# Patient Record
Sex: Female | Born: 1950
Health system: Southern US, Community
[De-identification: ages and names within clinical notes are randomized; demographics above are authoritative.]

## PROBLEM LIST (undated history)

## (undated) DIAGNOSIS — K6389 Other specified diseases of intestine: Secondary | ICD-10-CM

## (undated) DIAGNOSIS — K119 Disease of salivary gland, unspecified: Secondary | ICD-10-CM

## (undated) DIAGNOSIS — F32A Depression, unspecified: Secondary | ICD-10-CM

## (undated) DIAGNOSIS — R51 Headache: Secondary | ICD-10-CM

## (undated) DIAGNOSIS — F419 Anxiety disorder, unspecified: Secondary | ICD-10-CM

## (undated) DIAGNOSIS — H269 Unspecified cataract: Secondary | ICD-10-CM

## (undated) DIAGNOSIS — K635 Polyp of colon: Secondary | ICD-10-CM

## (undated) DIAGNOSIS — R519 Headache, unspecified: Secondary | ICD-10-CM

## (undated) DIAGNOSIS — K56609 Unspecified intestinal obstruction, unspecified as to partial versus complete obstruction: Secondary | ICD-10-CM

## (undated) DIAGNOSIS — R112 Nausea with vomiting, unspecified: Secondary | ICD-10-CM

## (undated) DIAGNOSIS — F329 Major depressive disorder, single episode, unspecified: Secondary | ICD-10-CM

## (undated) DIAGNOSIS — K3184 Gastroparesis: Secondary | ICD-10-CM

## (undated) DIAGNOSIS — R197 Diarrhea, unspecified: Secondary | ICD-10-CM

## (undated) DIAGNOSIS — Z9889 Other specified postprocedural states: Secondary | ICD-10-CM

## (undated) DIAGNOSIS — M1990 Unspecified osteoarthritis, unspecified site: Secondary | ICD-10-CM

## (undated) HISTORY — DX: Gastroparesis: K31.84

## (undated) HISTORY — PX: ABDOMINAL HYSTERECTOMY: SHX81

## (undated) HISTORY — PX: CATARACT EXTRACTION, BILATERAL: SHX1313

## (undated) HISTORY — PX: BREAST EXCISIONAL BIOPSY: SUR124

## (undated) HISTORY — DX: Unspecified intestinal obstruction, unspecified as to partial versus complete obstruction: K56.609

## (undated) HISTORY — DX: Unspecified cataract: H26.9

## (undated) HISTORY — PX: ROTATOR CUFF REPAIR: SHX139

## (undated) HISTORY — DX: Diarrhea, unspecified: R19.7

## (undated) HISTORY — DX: Other specified diseases of intestine: K63.89

## (undated) HISTORY — PX: EYE SURGERY: SHX253

## (undated) HISTORY — PX: APPENDECTOMY: SHX54

## (undated) HISTORY — PX: OTHER SURGICAL HISTORY: SHX169

## (undated) HISTORY — DX: Polyp of colon: K63.5

## (undated) HISTORY — PX: BREAST SURGERY: SHX581

## (undated) HISTORY — DX: Disease of salivary gland, unspecified: K11.9

---

## 1999-04-22 ENCOUNTER — Other Ambulatory Visit: Admission: RE | Admit: 1999-04-22 | Discharge: 1999-04-22 | Payer: Self-pay | Admitting: Obstetrics and Gynecology

## 2000-02-14 ENCOUNTER — Encounter: Admission: RE | Admit: 2000-02-14 | Discharge: 2000-05-14 | Payer: Self-pay | Admitting: Internal Medicine

## 2000-06-01 ENCOUNTER — Other Ambulatory Visit: Admission: RE | Admit: 2000-06-01 | Discharge: 2000-06-01 | Payer: Self-pay | Admitting: Obstetrics and Gynecology

## 2001-01-30 ENCOUNTER — Encounter (INDEPENDENT_AMBULATORY_CARE_PROVIDER_SITE_OTHER): Payer: Self-pay

## 2001-01-30 ENCOUNTER — Ambulatory Visit (HOSPITAL_COMMUNITY): Admission: RE | Admit: 2001-01-30 | Discharge: 2001-01-30 | Payer: Self-pay | Admitting: Internal Medicine

## 2001-05-02 ENCOUNTER — Other Ambulatory Visit: Admission: RE | Admit: 2001-05-02 | Discharge: 2001-05-02 | Payer: Self-pay | Admitting: Obstetrics and Gynecology

## 2002-05-13 ENCOUNTER — Encounter: Admission: RE | Admit: 2002-05-13 | Discharge: 2002-05-13 | Payer: Self-pay | Admitting: Gynecology

## 2002-05-13 ENCOUNTER — Encounter: Payer: Self-pay | Admitting: Gynecology

## 2002-05-14 ENCOUNTER — Other Ambulatory Visit: Admission: RE | Admit: 2002-05-14 | Discharge: 2002-05-14 | Payer: Self-pay | Admitting: Gynecology

## 2003-02-15 ENCOUNTER — Emergency Department (HOSPITAL_COMMUNITY): Admission: EM | Admit: 2003-02-15 | Discharge: 2003-02-15 | Payer: Self-pay | Admitting: Emergency Medicine

## 2003-05-07 ENCOUNTER — Ambulatory Visit (HOSPITAL_COMMUNITY): Admission: RE | Admit: 2003-05-07 | Discharge: 2003-05-07 | Payer: Self-pay | Admitting: Orthopedic Surgery

## 2003-07-01 ENCOUNTER — Encounter: Admission: RE | Admit: 2003-07-01 | Discharge: 2003-07-01 | Payer: Self-pay | Admitting: Gynecology

## 2003-07-01 ENCOUNTER — Encounter: Payer: Self-pay | Admitting: Gynecology

## 2003-07-08 ENCOUNTER — Other Ambulatory Visit: Admission: RE | Admit: 2003-07-08 | Discharge: 2003-07-08 | Payer: Self-pay | Admitting: Gynecology

## 2003-08-21 ENCOUNTER — Encounter: Admission: RE | Admit: 2003-08-21 | Discharge: 2003-08-21 | Payer: Self-pay | Admitting: General Surgery

## 2003-08-22 ENCOUNTER — Encounter: Admission: RE | Admit: 2003-08-22 | Discharge: 2003-08-22 | Payer: Self-pay | Admitting: General Surgery

## 2003-09-12 ENCOUNTER — Ambulatory Visit (HOSPITAL_COMMUNITY): Admission: RE | Admit: 2003-09-12 | Discharge: 2003-09-12 | Payer: Self-pay | Admitting: General Surgery

## 2003-09-12 ENCOUNTER — Encounter (INDEPENDENT_AMBULATORY_CARE_PROVIDER_SITE_OTHER): Payer: Self-pay | Admitting: Specialist

## 2004-06-23 ENCOUNTER — Ambulatory Visit (HOSPITAL_COMMUNITY): Admission: RE | Admit: 2004-06-23 | Discharge: 2004-06-23 | Payer: Self-pay | Admitting: Internal Medicine

## 2004-09-03 ENCOUNTER — Encounter: Admission: RE | Admit: 2004-09-03 | Discharge: 2004-09-03 | Payer: Self-pay | Admitting: Gynecology

## 2004-09-07 ENCOUNTER — Other Ambulatory Visit: Admission: RE | Admit: 2004-09-07 | Discharge: 2004-09-07 | Payer: Self-pay | Admitting: Gynecology

## 2004-12-22 ENCOUNTER — Other Ambulatory Visit: Admission: RE | Admit: 2004-12-22 | Discharge: 2004-12-22 | Payer: Self-pay | Admitting: Gynecology

## 2005-04-15 ENCOUNTER — Ambulatory Visit: Payer: Self-pay | Admitting: Internal Medicine

## 2005-06-22 ENCOUNTER — Other Ambulatory Visit: Admission: RE | Admit: 2005-06-22 | Discharge: 2005-06-22 | Payer: Self-pay | Admitting: Gynecology

## 2005-09-21 ENCOUNTER — Encounter (INDEPENDENT_AMBULATORY_CARE_PROVIDER_SITE_OTHER): Payer: Self-pay | Admitting: *Deleted

## 2005-09-21 ENCOUNTER — Encounter: Admission: RE | Admit: 2005-09-21 | Discharge: 2005-09-21 | Payer: Self-pay | Admitting: General Surgery

## 2005-12-14 ENCOUNTER — Ambulatory Visit: Payer: Self-pay | Admitting: Internal Medicine

## 2006-01-24 ENCOUNTER — Other Ambulatory Visit: Admission: RE | Admit: 2006-01-24 | Discharge: 2006-01-24 | Payer: Self-pay | Admitting: Gynecology

## 2006-01-27 ENCOUNTER — Ambulatory Visit: Payer: Self-pay | Admitting: Internal Medicine

## 2006-05-03 ENCOUNTER — Encounter: Admission: RE | Admit: 2006-05-03 | Discharge: 2006-05-03 | Payer: Self-pay | Admitting: Gynecology

## 2006-07-04 ENCOUNTER — Other Ambulatory Visit: Admission: RE | Admit: 2006-07-04 | Discharge: 2006-07-04 | Payer: Self-pay | Admitting: Gynecology

## 2007-06-25 ENCOUNTER — Encounter: Admission: RE | Admit: 2007-06-25 | Discharge: 2007-06-25 | Payer: Self-pay | Admitting: Gynecology

## 2008-04-17 ENCOUNTER — Telehealth: Payer: Self-pay | Admitting: Internal Medicine

## 2008-05-29 DIAGNOSIS — Z860100 Personal history of colon polyps, unspecified: Secondary | ICD-10-CM | POA: Insufficient documentation

## 2008-05-29 DIAGNOSIS — K3184 Gastroparesis: Secondary | ICD-10-CM | POA: Insufficient documentation

## 2008-05-29 DIAGNOSIS — E119 Type 2 diabetes mellitus without complications: Secondary | ICD-10-CM | POA: Insufficient documentation

## 2008-06-02 ENCOUNTER — Ambulatory Visit: Payer: Self-pay | Admitting: Internal Medicine

## 2008-06-03 ENCOUNTER — Telehealth: Payer: Self-pay | Admitting: Internal Medicine

## 2008-07-09 ENCOUNTER — Encounter: Admission: RE | Admit: 2008-07-09 | Discharge: 2008-07-09 | Payer: Self-pay | Admitting: General Surgery

## 2008-12-15 ENCOUNTER — Encounter: Admission: RE | Admit: 2008-12-15 | Discharge: 2008-12-15 | Payer: Self-pay | Admitting: General Surgery

## 2009-07-03 ENCOUNTER — Telehealth: Payer: Self-pay | Admitting: Internal Medicine

## 2009-07-10 ENCOUNTER — Encounter: Admission: RE | Admit: 2009-07-10 | Discharge: 2009-07-10 | Payer: Self-pay | Admitting: General Surgery

## 2009-09-04 ENCOUNTER — Ambulatory Visit: Payer: Self-pay | Admitting: Internal Medicine

## 2010-01-06 ENCOUNTER — Emergency Department (HOSPITAL_BASED_OUTPATIENT_CLINIC_OR_DEPARTMENT_OTHER): Admission: EM | Admit: 2010-01-06 | Discharge: 2010-01-06 | Payer: Self-pay | Admitting: Emergency Medicine

## 2010-01-06 ENCOUNTER — Ambulatory Visit: Payer: Self-pay | Admitting: Diagnostic Radiology

## 2010-01-26 ENCOUNTER — Telehealth: Payer: Self-pay | Admitting: Internal Medicine

## 2010-01-26 ENCOUNTER — Ambulatory Visit: Payer: Self-pay | Admitting: Internal Medicine

## 2010-01-27 ENCOUNTER — Encounter: Payer: Self-pay | Admitting: Internal Medicine

## 2010-07-30 ENCOUNTER — Encounter: Admission: RE | Admit: 2010-07-30 | Discharge: 2010-07-30 | Payer: Self-pay | Admitting: Gynecology

## 2010-10-25 ENCOUNTER — Encounter: Payer: Self-pay | Admitting: General Surgery

## 2010-11-04 NOTE — Progress Notes (Signed)
Summary: Triage-Diarrhea  Phone Note Call from Patient Call back at [phone]   Caller: Patient Call For: Dr. Juanda Chance Reason for Call: Talk to Nurse Summary of Call: Pt thinks she has some HPylori in her stomach and wants to know if she needs Cipro. Initial call taken by: Karna Christmas,  January 26, 2010 11:36 AM  Follow-up for Phone Call        Pt. c/o 2 weeks of constant diarrhea, has 6-8 watery, urgent stools daily,lower abd. discomfort & bloating.  Denies blood, fever, n/v.   1) Immodium as needed for diarrhea. 2) Gas-x,Phazyme, etc. as needed for gas & bloating. 3) tylenol/Ibuprofen as needed 4) Soft,bland/BRAT diet. No spicy,greasy,fried foods. Advance diet as tolerated. 5) I will call pt., with new orders, after MD reviews.   Follow-up by: Laureen Ochs LPN,  January 26, 2010 11:57 AM  Additional Follow-up for Phone Call Additional follow up Details #1::        chart reviewed, hx of bact.overgrowth, diabetic autonomic neuropathy please send her Cipro 250 mg by mouth two times a day, #14, and Flagyl 250mg , #30 ,1 by mouth three times a day. Obtain stool C.diff, culture, lactoferrin. Additional Follow-up by: Hart Carwin MD,  January 26, 2010 1:09 PM    Additional Follow-up for Phone Call Additional follow up Details #2::    Meds to pt. pharmacy, lab order is in IDX. Above MD orders reviewed with patient. Pt. instructed to call back as needed.   Follow-up by: Laureen Ochs LPN,  January 26, 2010 1:41 PM  New/Updated Medications: CIPROFLOXACIN HCL 250 MG  TABS (CIPROFLOXACIN HCL) Take 1 twice a day times 7 days METRONIDAZOLE 250 MG TABS (METRONIDAZOLE) Take 1 by mouth 3 times daily for 10 days. Prescriptions: METRONIDAZOLE 250 MG TABS (METRONIDAZOLE) Take 1 by mouth 3 times daily for 10 days.  #30 x 0   Entered by:   Laureen Ochs LPN   Authorized by:   Hart Carwin MD   Signed by:   Laureen Ochs LPN on 14/48/1856   Method used:   [facility]ronically to        [facility] [phone]* (retail)       [address]       [place], [place]  [address]       Ph: [phone] or [phone]       Fax: [phone]   RxID:   [id number] CIPROFLOXACIN HCL 250 MG  TABS (CIPROFLOXACIN HCL) Take 1 twice a day times 7 days  #14 x 0   Entered by:   Laureen Ochs LPN   Authorized by:   Hart Carwin MD   Signed by:   Laureen Ochs LPN on 65/68/1275   Method used:   [facility]ronically to        [facility] [address] [phone]* (retail)       [address]       [place], [place]  [address]       Ph: [phone] or [phone]       Fax: [phone]   RxID:   [id number]

## 2010-12-22 LAB — GLUCOSE, CAPILLARY: Glucose-Capillary: 309 mg/dL — ABNORMAL HIGH (ref 70–99)

## 2011-02-18 NOTE — Op Note (Signed)
   NAME:  Taylor Adams, SHELLHAMMER                          ACCOUNT NO.:  [id number]   MEDICAL RECORD NO.:  [id number]                   PATIENT TYPE:  AMB   LOCATION:  DAY                                  FACILITY:  [facility]   PHYSICIAN:  Marlowe Kays, M.D.               DATE OF BIRTH:  [birth year]   DATE OF PROCEDURE:  05/07/2003  DATE OF DISCHARGE:                                 OPERATIVE REPORT   PREOPERATIVE DIAGNOSES:  1. Right carpal tunnel syndrome.  2. Chronic impingement syndrome with partial rotator cuff tear, right     shoulder.   POSTOPERATIVE DIAGNOSES:  1. Right carpal tunnel syndrome.  2. Chronic impingement syndrome with partial rotator cuff tear, right     shoulder.   OPERATION:  1. Right carpal tunnel release.  2. Right shoulder arthroscopy with a) removal of intraarticular adhesions     and shaving of labrum, b) arthroscopic subacromial decompression.   SURGEON:  Marlowe Kays, M.D.   ASSISTANTDruscilla Brownie. Idolina Primer, P.A. for procedure #2.   Dictation ended here.                                               Marlowe Kays, M.D.    JA/MEDQ  D:  05/07/2003  T:  05/07/2003  Job:  [id number]

## 2011-02-18 NOTE — Op Note (Signed)
NAME:  KAZIAH, KRIZEK                          ACCOUNT NO.:  [id number]   MEDICAL RECORD NO.:  [id number]                   PATIENT TYPE:  AMB   LOCATION:  DAY                                  FACILITY:  [facility]   PHYSICIAN:  Gita Kudo, M.D.              DATE OF BIRTH:  March 19, 1951   DATE OF PROCEDURE:  09/12/2003  DATE OF DISCHARGE:                                 OPERATIVE REPORT   OPERATION/PROCEDURE:  Excision nodule left neck and left breast mass.   SURGEON:  Gita Kudo, M.D.   ANESTHESIA:  General endotracheal anesthesia.   PREOPERATIVE DIAGNOSES:  1. Mass left breast.  2. Mass in neck.   POSTOPERATIVE DIAGNOSES:  1. Probably benign lymph node, neck.  2. Thickened breast tissue - lymph node versus small fibroadenoma, left     breast.   CLINICAL SUMMARY:  A 60 year old female with a lump in her left breast on  physical examination.  Her ultrasound and mammogram were negative and  because of that, we decided to do excisional biopsy.  In addition, she has a  small neck lymph node that feels benign.   OPERATIVE FINDINGS:  The breast tissue felt lumpy and I think it may well  have been just an area of dense tissue but could have been a lymph node or  small fibroadenoma.   DESCRIPTION OF PROCEDURE:  Under satisfactory general endotracheal  anesthesia, the patient was positioned and prepped and draped in the  standard fashion.  One percent Xylocaine was used in addition to the general  for postoperative analgesia.   An oblique incision made, centered over the mass in the neck and carried  down through the platysma.  Bleeders were coagulated.  The lymph node was  delivered into the wound and by blunt and sharp dissection was freed and  electrocautery used to complete the dissection.  The wound was made  hemostatic by cautery and closed in layers with 3-0 Vicryl and 4-0 nylon.   A similar approach was used in the breast.  The area was infiltrated with  Xylocaine.   A curved incision was made and carried down to the breast mass.  Dissection was used all around with cautery after freeing the mass by  dissection and it was sent for permanent sections.  The wound was made  hemostatic by cautery, lavaged with saline  and closed in layers with 3-0 Vicryl and 4-0 nylon.  Steri-Strips were then  applied to each skin incision and the patient went to the recovery room from  the operating room in good condition.  There were no complications and the  sponge and needle counts were correct.  Gita Kudo, M.D.    MRL/MEDQ  D:  09/12/2003  T:  09/12/2003  Job:  [id number]   cc:   Gaspar Garbe, M.D.  [address]  [place]  [place] [address]  Fax: [phone]

## 2011-02-18 NOTE — Op Note (Signed)
NAME:  Taylor Adams, Taylor Adams                          ACCOUNT NO.:  [id number]   MEDICAL RECORD NO.:  [id number]                   PATIENT TYPE:  AMB   LOCATION:  DAY                                  FACILITY:  [facility]   PHYSICIAN:  Marlowe Kays, M.D.               DATE OF BIRTH:  Sep 14, 1951   DATE OF PROCEDURE:  05/07/2003  DATE OF DISCHARGE:                                 OPERATIVE REPORT   PREOPERATIVE DIAGNOSES:  1. Right carpal tunnel syndrome.  2. Chronic impingement syndrome with partial rotator cuff tear, right     shoulder.   POSTOPERATIVE DIAGNOSES:  1. Right carpal tunnel syndrome.  2. Chronic impingement syndrome with partial rotator cuff tear, right     shoulder.   OPERATION:  1. Right carpal tunnel release.  2. Right shoulder arthroscopy with a) debridement of labrum and release of     intraarticular adhesions, b) arthroscopic subacromial decompression.   SURGEON:  Marlowe Kays, M.D.   ASSISTANTDruscilla Brownie. Idolina Primer, P.A. operation #2.   ANESTHESIA:  General.   PATHOLOGY AND JUSTIFICATION FOR PROCEDURE:  She had signs and symptoms of  carpal tunnel syndrome confirmed with nerve conduction studies and chronic  shoulder pain with an MRI demonstrating no definite abnormality in the joint  but rotator cuff tendinopathy associated with a down sloping acromion.  Accordingly she is here for the above mentioned surgery.   DESCRIPTION OF PROCEDURE:  Satisfactory general anesthesia, first did the  carpal tunnel syndrome using an arm board, pneumatic tourniquet, forearm was  prepped from mid forearm to fingertips and draped in a sterile field. Marked  out a carpal tunnel incision along the base of thenar eminence crossing  obliquely over the flexor crease of the wrist and the distal forearm. The  median nerve was identified, there was no palmaris longus tendon. The median  nerve artery completely terminated just proximal to the wrist and indicated  significant  impingement at this level.  She also had significant impingement  in the mid palm. The nerve was dissected out as well as all of its branches  in the distal palm, potential bleeders were coagulated with bipolar cautery.  The wound was then irrigated with sterile saline, skin and subcutaneous  tissue were closed with interrupted 4-0 nylon mattress sutures. The scrub  nurse and back table were kept sterile as we placed her in the beach chair  position on the Schlein frame and then prepped the right shoulder with  duraprep, draped her in a sterile field. I had rescrubbed and gowned for the  second portion of the procedure. Marked out the shoulder anatomy and  injected the lateral portal, posterior soft spot portal and the subacromial  space with 0.5% Marcaine with adrenaline. Through a posterior soft spot  portal, I entered the glenohumeral joint with a blunt trocar although it  took me several passes for some reason. On inspection  of the joint, she had  some significant adhesions which were tethering the motion of the joint as  well as what appeared to be a little labral disruption at the biceps anchor  area. The biceps tendon and rotator cuff did appear to be in good shape. I  advanced the scope into the anterior capsule, used a switching stick and  made an anterior incision and over this I placed a metal cannula and a 4/2  shaver and removed the restricting adhesions and also shaved down the biceps  anchor and labral area. All fluid was then removed from the glenohumeral  joint and I redirected the scope in the subacromial area and through a  lateral portal used a 4/2 shaver to remove subdeltoid bursa. I then used the  ArthroCare vaporizer to begin removing soft tissue from the underneath  surface of the acromion and the coracoacromial ligament. She had a  significant impingement problem, pictures were taken. I then used a 4-0 oval  bur to bur down the underneath surface of the acromion. I  went back and  forth between the bur and the vaporizer until I felt that she had complete  release of the impingement as documented with pictures with the arm to the  side and the arm abducted.  All fluid possible was then removed from the  subacromial space as well. The three portals were once again infiltrated  with 0.5% Marcaine with adrenaline and closed with 4-0 nylon. The  subacromial space was also injected with the Marcaine and adrenaline.  Betadine adaptic dry sterile dressing were applied and arm was placed in a  shoulder immobilizer after placing her in a volar plaster splint for her  carpal tunnel. She was taken to the recovery room in satisfactory condition  with no known complications.                                               Marlowe Kays, M.D.    JA/MEDQ  D:  05/07/2003  T:  05/07/2003  Job:  [id number]

## 2011-03-24 ENCOUNTER — Other Ambulatory Visit: Payer: Self-pay | Admitting: Gynecology

## 2011-06-10 ENCOUNTER — Ambulatory Visit (INDEPENDENT_AMBULATORY_CARE_PROVIDER_SITE_OTHER): Payer: 59 | Admitting: Internal Medicine

## 2011-06-10 ENCOUNTER — Encounter: Payer: Self-pay | Admitting: Internal Medicine

## 2011-06-10 DIAGNOSIS — R109 Unspecified abdominal pain: Secondary | ICD-10-CM

## 2011-06-10 DIAGNOSIS — R197 Diarrhea, unspecified: Secondary | ICD-10-CM

## 2011-06-10 MED ORDER — HYDROCODONE-ACETAMINOPHEN 5-500 MG PO TABS: 1.0000 | ORAL_TABLET | Freq: Four times a day (QID) | ORAL | Status: AC | PRN

## 2011-06-10 MED ORDER — CIPROFLOXACIN HCL 250 MG PO TABS: ORAL_TABLET | ORAL | Status: DC

## 2011-06-10 NOTE — Progress Notes (Signed)
Taylor Adams [facility] [birth year] MRN [id number]     History of Present Illness:  This is a 60 year old white female with insulin-dependent diabetes with episodic abdominal pain localized to the periumbilical area associated with severe cramps and diarrhea. She has had intermittent diarrhea for many years dating back to 2002 when she was diagnosed with bacterial overgrowth on a breath Hydrogen test.  There is no fever. During one episode in 2011 stool studies were negative. She has a positive family history of gallbladder disease and her daughter underwent a cholecystectomy at the age of 69. Patient takes probiotics everyday and was on Cipro for bacterial overgrowth in the past. She has documented gastroparesis. She was on Reglan but it was discontinued 2 years ago. Patient had a colonoscopy in 2002 with findings of a tubular adenoma. A pepeat colonoscopy in 2007 was normal. A small bowel follow-through in 2005 was normal. An upper abdominal ultrasound in 2005 was negative. Her celiac profile was normal in 2005 and again recently. Her small bowel follow-through in 2005 was normal. She had an allergic reaction to Flagyl in 2002.   Past Medical History  Diagnosis Date  . Gastroparesis   . Intestinal bacterial overgrowth   . Diabetes mellitus   . Benign colon polyp   . Diarrhea    Past Surgical History  Procedure Date  . Abdominal hysterectomy   . Breast surgery   . Appendectomy   . Resection of lymph node in neck     reports that she has never smoked. She has never used smokeless tobacco. She reports that she does not drink alcohol or use illicit drugs. family history includes Colon polyps in her father; Diabetes in her father and mother; and Heart disease in her brother and father.  There is no history of Colon cancer. No Known Allergies      Review of Systems: Denies nausea vomiting dysphagia odynophagia. Weight has been stable.  The remainder of the 10  point ROS is negative except as  outlined in H&P   Physical Exam: General appearance  Well developed, in no distress. Eyes- non icteric. HEENT nontraumatic, normocephalic. Mouth no lesions, tongue papillated, no cheilosis. Neck supple without adenopathy, thyroid not enlarged, no carotid bruits, no JVD. Lungs Clear to auscultation bilaterally. Cor normal S1 normal S2, regular rhythm , no murmur,  quiet precordium. Abdomen soft mildly diffusely tender. Hyperactive bowel sounds. No distention. No palpable mass. Insulin pump in the periumbilical area. Rectal: Soft Hemoccult negative stool. Extremities no pedal edema. Skin no lesions. Neurological alert and oriented x 3. Psychological normal mood and affect.  Assessment and Plan:  Problem #1 episodes of diarrhea dating back to 2002 when diagnosed with bacterial overgrowth. The most recent attacks are associated with severe abdominal pain. In the past, she responded to Cipro. At this time I want to rule out symptomatic gallbladder disease since her daughter just underwent a cholecystectomy. Her amylase during one of the attacks in 2005 was elevated but her upper abdominal ultrasound was negative. I would go, at this point, as far as  HIDA scan to make sure that she doesn't have acalculous cholecystitis. We will also proceed with a CT scan of the abdomen and pelvis to look for Crohn's disease and abnormal thickening of the small bowel suggestive of enteritis. I asked her to continue her probiotics and continue on Levsin sublingually 0.125 mg to the onset of the attack. She was given a limited amount of Vicodin  #10 tablets, to take at the onset  of abdominal pain.    06/10/2011 Lina Sar

## 2011-06-10 NOTE — Patient Instructions (Addendum)
You have been scheduled for an abdominal ultrasound at [facility] Radiology (1st floor of hospital) on 06/13/11 Monday at 8:30 am. Please arrive 15 minutes prior to your appointment for registration. Make certain not to have anything to eat or drink 6 hours prior to your appointment. Should you need to reschedule your appointment, please contact radiology at [phone].  ------------------------------------------------------------------------------------------------------------------------------------------------------------------------- You have been scheduled for a CT scan of the abdomen and pelvis at [facility] CT ([address]---this is in the same building as Architectural technologist).   You are scheduled on Tuesday 06/14/11 at 2:30 pm. You should arrive 15 minutes prior to your appointment time for registration. Please follow the written instructions below on the day of your exam:  WARNING: IF YOU ARE ALLERGIC TO IODINE/X-RAY DYE, PLEASE NOTIFY RADIOLOGY IMMEDIATELY AT [phone]! YOU WILL BE GIVEN A 13 HOUR PREMEDICATION PREP.  1) Do not eat or drink anything after 10:30 am (4 hours prior to your test) 2) You have been given 2 bottles of oral contrast to drink. The solution may taste better if refrigerated, but do NOT add ice or any other liquid to this solution. Shake well before drinking.    Drink 1 bottle of contrast @ 12:30 pm (2 hours prior to your exam)  Drink 1 bottle of contrast @ 1:30 pm (1 hour prior to your exam)  You may take any medications as prescribed with a small amount of water except for the following: Metformin, Glucophage, Glucovance, Avandamet, Riomet, Fortamet, Actoplus Met, Janumet, Glumetza or Metaglip. The above medications must be held the day of the exam AND 48 hours after the exam.  The purpose of you drinking the oral contrast is to aid in the visualization of your intestinal tract. The contrast solution may cause some diarrhea. Before your exam is  started, you will be given a small amount of fluid to drink. Depending on your individual set of symptoms, you may also receive an intravenous injection of x-ray contrast/dye. Plan on being at [facility] for 30 minutes or long, depending on the type of exam you are having performed.  If you have any questions regarding your exam or if you need to reschedule, you may call the CT department at [phone] between the hours of 8:00 am and 5:00 pm, Monday-Friday.  ________________________________________________________________________  We have sent the following medications to your pharmacy for you to pick up at your convenience: Vicodin Cipro  CC: Dr Wylene Simmer

## 2011-06-13 ENCOUNTER — Ambulatory Visit (HOSPITAL_COMMUNITY)
Admission: RE | Admit: 2011-06-13 | Discharge: 2011-06-13 | Disposition: A | Discharge status: Home / Self Care | Payer: 59 | Source: Ambulatory Visit | Attending: Internal Medicine | Admitting: Internal Medicine

## 2011-06-13 DIAGNOSIS — R197 Diarrhea, unspecified: Secondary | ICD-10-CM | POA: Insufficient documentation

## 2011-06-13 DIAGNOSIS — R109 Unspecified abdominal pain: Secondary | ICD-10-CM | POA: Insufficient documentation

## 2011-06-14 ENCOUNTER — Telehealth: Payer: Self-pay | Admitting: *Deleted

## 2011-06-14 ENCOUNTER — Other Ambulatory Visit (INDEPENDENT_AMBULATORY_CARE_PROVIDER_SITE_OTHER): Payer: 59 | Admitting: *Deleted

## 2011-06-14 ENCOUNTER — Ambulatory Visit (INDEPENDENT_AMBULATORY_CARE_PROVIDER_SITE_OTHER)
Admission: RE | Admit: 2011-06-14 | Discharge: 2011-06-14 | Disposition: A | Discharge status: Home / Self Care | Payer: 59 | Source: Ambulatory Visit | Attending: Internal Medicine | Admitting: Internal Medicine

## 2011-06-14 DIAGNOSIS — R109 Unspecified abdominal pain: Secondary | ICD-10-CM

## 2011-06-14 DIAGNOSIS — R197 Diarrhea, unspecified: Secondary | ICD-10-CM

## 2011-06-14 MED ORDER — IOHEXOL 300 MG/ML  SOLN
80.0000 mL | Freq: Once | INTRAMUSCULAR | Status: AC | PRN
  Administered 2011-06-14: 80 mL via INTRAVENOUS

## 2011-06-14 NOTE — Telephone Encounter (Signed)
Patient needs order for Stat BUN, Crea. Orders in computer

## 2011-06-15 ENCOUNTER — Telehealth: Payer: Self-pay | Admitting: *Deleted

## 2011-06-15 NOTE — Telephone Encounter (Signed)
Message copied by Daphine Deutscher on Wed Jun 15, 2011 10:11 AM ------      Message from: Hart Carwin      Created: Wed Jun 15, 2011  9:44 AM       Please call pt with normal ultrasound of the gall bladder

## 2011-06-15 NOTE — Telephone Encounter (Signed)
Patient notified of results.

## 2011-06-22 ENCOUNTER — Telehealth: Payer: Self-pay | Admitting: Internal Medicine

## 2011-06-22 NOTE — Telephone Encounter (Signed)
Please call pt with normal CT scan, gall bladder looks OK but CT scan  Does not show gall bladder function. Her daughter just had a lap chole. Please schedule HIDA with [facility]CK.

## 2011-06-22 NOTE — Telephone Encounter (Signed)
Scheduled HIDA with [facility] at [facility] on 07/14/11 at 7:45 AM. NPO after midnight. No pain medication 8 hours prior.Patient notified of date and instructions.

## 2011-06-22 NOTE — Telephone Encounter (Signed)
Spoke with patient and told her the CT was negative but would send a note to Dr. Juanda Chance and see if she has further information for patient. Please, advise.

## 2011-07-11 ENCOUNTER — Other Ambulatory Visit (HOSPITAL_COMMUNITY): Payer: 59

## 2011-07-14 ENCOUNTER — Ambulatory Visit (HOSPITAL_COMMUNITY)
Admission: RE | Admit: 2011-07-14 | Discharge: 2011-07-14 | Disposition: A | Discharge status: Home / Self Care | Payer: 59 | Source: Ambulatory Visit | Attending: Internal Medicine | Admitting: Internal Medicine

## 2011-07-14 DIAGNOSIS — R109 Unspecified abdominal pain: Secondary | ICD-10-CM | POA: Insufficient documentation

## 2011-07-14 MED ORDER — SINCALIDE 5 MCG IJ SOLR: 0.0200 ug/kg | Freq: Once | INTRAMUSCULAR | Status: DC

## 2011-07-14 MED ORDER — TECHNETIUM TC 99M MERTIATIDE: 15.0000 | Freq: Once | INTRAVENOUS | Status: AC | PRN

## 2011-07-14 MED ORDER — TECHNETIUM TC 99M MEBROFENIN IV KIT: 5.2000 | PACK | Freq: Once | INTRAVENOUS | Status: AC | PRN

## 2011-07-15 ENCOUNTER — Telehealth: Payer: Self-pay | Admitting: *Deleted

## 2011-07-15 NOTE — Telephone Encounter (Signed)
Left a message at cell number for patient to call me. 

## 2011-07-15 NOTE — Telephone Encounter (Signed)
Spoke with patient and gave her results as per Dr. Brodie. 

## 2011-07-15 NOTE — Telephone Encounter (Signed)
Message copied by Daphine Deutscher on Fri Jul 15, 2011 10:13 AM ------      Message from: Hart Carwin      Created: Thu Jul 14, 2011  5:06 PM       Please call pt with normal HIDA scan, she experienced  Symptoms with CCK injection but the gall bladder  Functions normally. No reason to consider cholecystectomy.

## 2011-08-12 ENCOUNTER — Other Ambulatory Visit: Payer: Self-pay | Admitting: Dermatology

## 2011-12-13 ENCOUNTER — Other Ambulatory Visit: Payer: Self-pay | Admitting: Internal Medicine

## 2011-12-13 MED ORDER — HYOSCYAMINE SULFATE 0.125 MG SL SUBL: 0.1250 mg | SUBLINGUAL_TABLET | Freq: Three times a day (TID) | SUBLINGUAL | Status: DC | PRN

## 2011-12-13 NOTE — Telephone Encounter (Signed)
rx sent

## 2012-03-26 ENCOUNTER — Other Ambulatory Visit: Payer: Self-pay | Admitting: Gynecology

## 2012-07-13 ENCOUNTER — Other Ambulatory Visit: Payer: Self-pay | Admitting: Gynecology

## 2012-07-13 DIAGNOSIS — Z1231 Encounter for screening mammogram for malignant neoplasm of breast: Secondary | ICD-10-CM

## 2012-08-09 ENCOUNTER — Ambulatory Visit
Admission: RE | Admit: 2012-08-09 | Discharge: 2012-08-09 | Disposition: A | Discharge status: Home / Self Care | Payer: 59 | Source: Ambulatory Visit | Attending: Gynecology | Admitting: Gynecology

## 2012-08-09 DIAGNOSIS — Z1231 Encounter for screening mammogram for malignant neoplasm of breast: Secondary | ICD-10-CM

## 2013-02-20 ENCOUNTER — Other Ambulatory Visit (INDEPENDENT_AMBULATORY_CARE_PROVIDER_SITE_OTHER): Payer: Self-pay | Admitting: Otolaryngology

## 2013-02-20 DIAGNOSIS — K115 Sialolithiasis: Secondary | ICD-10-CM

## 2013-02-26 ENCOUNTER — Other Ambulatory Visit: Payer: 59

## 2013-02-27 ENCOUNTER — Ambulatory Visit
Admission: RE | Admit: 2013-02-27 | Discharge: 2013-02-27 | Disposition: A | Discharge status: Home / Self Care | Payer: 59 | Source: Ambulatory Visit | Attending: Otolaryngology | Admitting: Otolaryngology

## 2013-02-27 DIAGNOSIS — K115 Sialolithiasis: Secondary | ICD-10-CM

## 2013-02-27 MED ORDER — IOHEXOL 300 MG/ML  SOLN
75.0000 mL | Freq: Once | INTRAMUSCULAR | Status: AC | PRN
  Administered 2013-02-27: 75 mL via INTRAVENOUS

## 2013-10-03 ENCOUNTER — Ambulatory Visit (HOSPITAL_COMMUNITY): Admission: RE | Admit: 2013-10-03 | Payer: 59 | Source: Ambulatory Visit

## 2013-10-03 ENCOUNTER — Encounter (HOSPITAL_COMMUNITY): Payer: Self-pay | Admitting: Emergency Medicine

## 2013-10-03 ENCOUNTER — Emergency Department (HOSPITAL_COMMUNITY)
Admission: EM | Admit: 2013-10-03 | Discharge: 2013-10-03 | Disposition: A | Discharge status: Home / Self Care | Payer: 59 | Attending: Emergency Medicine | Admitting: Emergency Medicine

## 2013-10-03 ENCOUNTER — Telehealth: Payer: Self-pay | Admitting: Internal Medicine

## 2013-10-03 DIAGNOSIS — R109 Unspecified abdominal pain: Secondary | ICD-10-CM

## 2013-10-03 DIAGNOSIS — Z9089 Acquired absence of other organs: Secondary | ICD-10-CM | POA: Insufficient documentation

## 2013-10-03 DIAGNOSIS — Z90710 Acquired absence of both cervix and uterus: Secondary | ICD-10-CM | POA: Insufficient documentation

## 2013-10-03 DIAGNOSIS — Z794 Long term (current) use of insulin: Secondary | ICD-10-CM | POA: Insufficient documentation

## 2013-10-03 DIAGNOSIS — R1084 Generalized abdominal pain: Secondary | ICD-10-CM | POA: Insufficient documentation

## 2013-10-03 DIAGNOSIS — E119 Type 2 diabetes mellitus without complications: Secondary | ICD-10-CM | POA: Insufficient documentation

## 2013-10-03 DIAGNOSIS — R112 Nausea with vomiting, unspecified: Secondary | ICD-10-CM | POA: Insufficient documentation

## 2013-10-03 DIAGNOSIS — Z860100 Personal history of colon polyps, unspecified: Secondary | ICD-10-CM | POA: Insufficient documentation

## 2013-10-03 DIAGNOSIS — Z79899 Other long term (current) drug therapy: Secondary | ICD-10-CM | POA: Insufficient documentation

## 2013-10-03 DIAGNOSIS — Z8719 Personal history of other diseases of the digestive system: Secondary | ICD-10-CM | POA: Insufficient documentation

## 2013-10-03 LAB — POCT I-STAT 3, VENOUS BLOOD GAS (G3P V)
Acid-Base Excess: 1 mmol/L (ref 0.0–2.0)
Bicarbonate: 28.3 mEq/L — ABNORMAL HIGH (ref 20.0–24.0)
O2 SAT: 43 %
PO2 VEN: 26 mmHg — AB (ref 30.0–45.0)
Patient temperature: 97
TCO2: 30 mmol/L (ref 0–100)
pCO2, Ven: 56.1 mmHg — ABNORMAL HIGH (ref 45.0–50.0)
pH, Ven: 7.307 — ABNORMAL HIGH (ref 7.250–7.300)

## 2013-10-03 LAB — COMPREHENSIVE METABOLIC PANEL
ALK PHOS: 87 U/L (ref 39–117)
ALT: 20 U/L (ref 0–35)
AST: 34 U/L (ref 0–37)
Albumin: 4.6 g/dL (ref 3.5–5.2)
BUN: 17 mg/dL (ref 6–23)
CALCIUM: 10.1 mg/dL (ref 8.4–10.5)
CO2: 25 meq/L (ref 19–32)
Chloride: 92 mEq/L — ABNORMAL LOW (ref 96–112)
Creatinine, Ser: 0.65 mg/dL (ref 0.50–1.10)
GLUCOSE: 309 mg/dL — AB (ref 70–99)
POTASSIUM: 4.1 meq/L (ref 3.7–5.3)
SODIUM: 137 meq/L (ref 137–147)
TOTAL PROTEIN: 8 g/dL (ref 6.0–8.3)
Total Bilirubin: 0.4 mg/dL (ref 0.3–1.2)

## 2013-10-03 LAB — URINALYSIS, ROUTINE W REFLEX MICROSCOPIC
BILIRUBIN URINE: NEGATIVE
GLUCOSE, UA: 500 mg/dL — AB
HGB URINE DIPSTICK: NEGATIVE
Ketones, ur: >80 mg/dL — AB
Leukocytes, UA: NEGATIVE
Nitrite: NEGATIVE
Protein, ur: NEGATIVE mg/dL
SPECIFIC GRAVITY, URINE: 1.018 (ref 1.005–1.030)
UROBILINOGEN UA: 0.2 mg/dL (ref 0.0–1.0)
pH: 7 (ref 5.0–8.0)

## 2013-10-03 LAB — LIPASE, BLOOD: Lipase: 27 U/L (ref 11–59)

## 2013-10-03 LAB — CBC WITH DIFFERENTIAL/PLATELET
BASOS ABS: 0 10*3/uL (ref 0.0–0.1)
Basophils Relative: 1 % (ref 0–1)
Eosinophils Absolute: 0.2 10*3/uL (ref 0.0–0.7)
Eosinophils Relative: 3 % (ref 0–5)
HCT: 36 % (ref 36.0–46.0)
Hemoglobin: 12.5 g/dL (ref 12.0–15.0)
LYMPHS ABS: 1.8 10*3/uL (ref 0.7–4.0)
LYMPHS PCT: 25 % (ref 12–46)
MCH: 29.1 pg (ref 26.0–34.0)
MCHC: 34.7 g/dL (ref 30.0–36.0)
MCV: 83.9 fL (ref 78.0–100.0)
Monocytes Absolute: 0.4 10*3/uL (ref 0.1–1.0)
Monocytes Relative: 6 % (ref 3–12)
NEUTROS PCT: 66 % (ref 43–77)
Neutro Abs: 4.8 10*3/uL (ref 1.7–7.7)
PLATELETS: 220 10*3/uL (ref 150–400)
RBC: 4.29 MIL/uL (ref 3.87–5.11)
RDW: 13.8 % (ref 11.5–15.5)
WBC: 7.3 10*3/uL (ref 4.0–10.5)

## 2013-10-03 LAB — GLUCOSE, CAPILLARY
GLUCOSE-CAPILLARY: 283 mg/dL — AB (ref 70–99)
GLUCOSE-CAPILLARY: 272 mg/dL — AB (ref 70–99)

## 2013-10-03 MED ORDER — IOHEXOL 300 MG/ML  SOLN
25.0000 mL | INTRAMUSCULAR | Status: DC
  Administered 2013-10-03: 25 mL via ORAL

## 2013-10-03 MED ORDER — SODIUM CHLORIDE 0.9 % IV BOLUS (SEPSIS)
1000.0000 mL | Freq: Once | INTRAVENOUS | Status: AC
  Administered 2013-10-03: 1000 mL via INTRAVENOUS

## 2013-10-03 MED ORDER — TRAMADOL HCL 50 MG PO TABS: 50.0000 mg | ORAL_TABLET | Freq: Four times a day (QID) | ORAL | Status: DC | PRN

## 2013-10-03 MED ORDER — METOCLOPRAMIDE HCL 5 MG/ML IJ SOLN
10.0000 mg | Freq: Once | INTRAMUSCULAR | Status: AC
  Administered 2013-10-03: 10 mg via INTRAVENOUS
  Filled 2013-10-03: qty 2

## 2013-10-03 MED ORDER — ONDANSETRON HCL 4 MG/2ML IJ SOLN
4.0000 mg | Freq: Once | INTRAMUSCULAR | Status: AC
  Administered 2013-10-03: 4 mg via INTRAVENOUS
  Filled 2013-10-03: qty 2

## 2013-10-03 MED ORDER — ONDANSETRON HCL 4 MG/2ML IJ SOLN
INTRAMUSCULAR | Status: AC
  Filled 2013-10-03: qty 2

## 2013-10-03 MED ORDER — HYDROMORPHONE HCL PF 1 MG/ML IJ SOLN
1.0000 mg | Freq: Once | INTRAMUSCULAR | Status: AC
  Administered 2013-10-03: 1 mg via INTRAVENOUS

## 2013-10-03 MED ORDER — HYDROMORPHONE HCL PF 1 MG/ML IJ SOLN
INTRAMUSCULAR | Status: AC
  Filled 2013-10-03: qty 1

## 2013-10-03 MED ORDER — HYDROMORPHONE HCL PF 1 MG/ML IJ SOLN
1.0000 mg | Freq: Once | INTRAMUSCULAR | Status: AC
  Administered 2013-10-03: 1 mg via INTRAVENOUS
  Filled 2013-10-03: qty 1

## 2013-10-03 MED ORDER — ONDANSETRON HCL 4 MG/2ML IJ SOLN
4.0000 mg | Freq: Once | INTRAMUSCULAR | Status: AC
  Administered 2013-10-03: 4 mg via INTRAVENOUS

## 2013-10-03 NOTE — ED Notes (Signed)
C/o generalized abd pain, nausea, and vomiting since 7pm.  Denies diarrhea.

## 2013-10-03 NOTE — ED Notes (Signed)
Dr Hyacinth MeekerMiller given a copy of pt blood gas venous results

## 2013-10-03 NOTE — Discharge Instructions (Signed)
We have encouraged you to stay for your CAT scan, you have declined and have decided to leave at this time. You may return at any time for repeat evaluation and recheck. I encourage you to follow up with your doctor today for recheck and further investigation into the cause of your abdominal pain.  Please call your doctor for a followup appointment within 24-48 hours. When you talk to your doctor please let them know that you were seen in the emergency department and have them acquire all of your records so that they can discuss the findings with you and formulate a treatment plan to fully care for your new and ongoing problems.

## 2013-10-03 NOTE — ED Provider Notes (Signed)
CSN: [id number]     Arrival date & time 10/03/13  0144 History   First MD Initiated Contact with Patient 10/03/13 [phone]     Chief Complaint  Patient presents with  . Abdominal Pain  . Emesis   HPI  History provided by the patient. Patient is a 63 year old female with history of diabetes, gastroparesis, appendectomy and total hysterectomy who presents with complaints of acute onset abdominal pain with nausea and vomiting. Patient states she began to feel bad around 7 PM shortly after eating dinner. She states that she was having some broccoli and green peppers which she feels will cause of her symptoms. She reports having multiple episodes of nausea and vomiting x 6. This has been followed by significant abdominal pain and discomfort. Patient did not use any medications or treatment for symptoms. She also states that her blood sugar became high surely after onset of symptoms. She was otherwise well during the day with typical blood sugars in the 100s. She has not had any recent illnesses. No fever, chills or sweats. Denies any urinary symptoms. No dysuria, hematuria urinary frequency. Denies any back or flank pain. No other aggravating or alleviating factors. No other associated symptoms. Patient does state she has had some similar pains in the past and is followed for some of her abdominal pains by Dr. Dickie La with GI. She does have an upcoming appointment on January 14.   Past Medical History  Diagnosis Date  . Gastroparesis   . Intestinal bacterial overgrowth   . Diabetes mellitus   . Benign colon polyp   . Diarrhea    Past Surgical History  Procedure Laterality Date  . Abdominal hysterectomy    . Breast surgery    . Appendectomy    . Resection of lymph node in neck     Family History  Problem Relation Age of Onset  . Colon cancer Neg Hx   . Colon polyps Father   . Heart disease Father   . Heart disease Brother   . Diabetes Mother   . Diabetes Father    History  Substance Use  Topics  . Smoking status: Never Smoker   . Smokeless tobacco: Never Used  . Alcohol Use: Yes   OB History   Grav Para Term Preterm Abortions TAB SAB Ect Mult Living                 Review of Systems  Constitutional: Negative for fever, chills and diaphoresis.  Respiratory: Negative for shortness of breath.   Gastrointestinal: Positive for nausea, vomiting and abdominal pain. Negative for diarrhea and constipation.  Genitourinary: Negative for dysuria, frequency, hematuria and flank pain.  All other systems reviewed and are negative.    Allergies  Review of patient's allergies indicates no known allergies.  Home Medications   Current Outpatient Rx  Name  Route  Sig  Dispense  Refill  . CALCIUM PO   Oral   Take 1 tablet by mouth daily.         . CHROMIUM PO   Oral   Take 1 tablet by mouth daily.         Marland Kitchen DIOVAN 80 MG tablet   Oral   Take 80 mg by mouth daily.          . Insulin Human (INSULIN PUMP) 100 unit/ml SOLN   Subcutaneous   Inject into the skin continuous.         Marland Kitchen MAGNESIUM PO   Oral   Take  1 tablet by mouth daily.         . Probiotic Product (PROBIOTIC PO)   Oral   Take 1 tablet by mouth daily.          BP 144/68  Pulse 89  Temp(Src) 97 F (36.1 C) (Oral)  Resp 20  Wt 115 lb 4 oz (52.277 kg)  SpO2 99% Physical Exam  Nursing note and vitals reviewed. Constitutional: She is oriented to person, place, and time. She appears well-developed and well-nourished. No distress.  HENT:  Head: Normocephalic.  Cardiovascular: Normal rate and regular rhythm.   Pulmonary/Chest: Effort normal and breath sounds normal. No respiratory distress. She has no wheezes. She has no rales.  Abdominal: Soft. She exhibits no distension. There is tenderness. There is guarding. There is no rebound.  There is diffuse tenderness throughout the entire abdomen with guarding.  Neurological: She is alert and oriented to person, place, and time.  Skin: Skin is warm  and dry. No rash noted.  Psychiatric: She has a normal mood and affect. Her behavior is normal.    ED Course  Procedures   DIAGNOSTIC STUDIES: Oxygen Saturation is 100% on room air.  COORDINATION OF CARE:  Nursing notes reviewed. Vital signs reviewed. Initial pt interview and examination performed.   3:45 AM-patient seen and evaluated. She appears uncomfortable with intermittent cramping sharp pains. Discussed work up plan with pt at bedside, which includes lab testing. Pt agrees with plan.  Patient feeling much better after initial treatments. She continues to have diffuse pains on abdominal exam. Patient does have a slight elevated anion gap of 20. I discussed recommendations for her further testing with [facility] as well as recommendations for a CAT scan. Patient initially was slightly hesitant stating she felt better and wished to return home however she did agree with the additional tests.  Patient was discussed with attending physician who agrees with plan.   5:30 AM nurse notified me that patient no longer wishes to wait and have the CAT scan. She wishes to return home. She has been given strict return precautions. Did advise her to the risks of not having a CAT scan. Besides elevated anion gap no other significant findings on lab testing. Patient expressed her understanding and agreed to return if she changes her mind or worsens.   Treatment plan initiated: Medications  metoCLOPramide (REGLAN) injection 10 mg (10 mg Intravenous Given 10/03/13 0244)  sodium chloride 0.9 % bolus 1,000 mL (1,000 mLs Intravenous New Bag/Given 10/03/13 0243)  HYDROmorphone (DILAUDID) injection 1 mg (1 mg Intravenous Given 10/03/13 0244)  ondansetron (ZOFRAN) injection 4 mg (4 mg Intravenous Given 10/03/13 0244)    Results for orders placed during the hospital encounter of 10/03/13  CBC WITH DIFFERENTIAL      Result Value Range   WBC 7.3  4.0 - 10.5 K/uL   RBC 4.29  3.87 - 5.11 MIL/uL   Hemoglobin 12.5  12.0  - 15.0 g/dL   HCT 95.6  21.3 - 08.6 %   MCV 83.9  78.0 - 100.0 fL   MCH 29.1  26.0 - 34.0 pg   MCHC 34.7  30.0 - 36.0 g/dL   RDW 57.8  46.9 - 62.9 %   Platelets 220  150 - 400 K/uL   Neutrophils Relative % 66  43 - 77 %   Neutro Abs 4.8  1.7 - 7.7 K/uL   Lymphocytes Relative 25  12 - 46 %   Lymphs Abs 1.8  0.7 - 4.0  K/uL   Monocytes Relative 6  3 - 12 %   Monocytes Absolute 0.4  0.1 - 1.0 K/uL   Eosinophils Relative 3  0 - 5 %   Eosinophils Absolute 0.2  0.0 - 0.7 K/uL   Basophils Relative 1  0 - 1 %   Basophils Absolute 0.0  0.0 - 0.1 K/uL  COMPREHENSIVE METABOLIC PANEL      Result Value Range   Sodium 137  137 - 147 mEq/L   Potassium 4.1  3.7 - 5.3 mEq/L   Chloride 92 (*) 96 - 112 mEq/L   CO2 25  19 - 32 mEq/L   Glucose, Bld 309 (*) 70 - 99 mg/dL   BUN 17  6 - 23 mg/dL   Creatinine, Ser 7.250.65  0.50 - 1.10 mg/dL   Calcium 36.610.1  8.4 - 44.010.5 mg/dL   Total Protein 8.0  6.0 - 8.3 g/dL   Albumin 4.6  3.5 - 5.2 g/dL   AST 34  0 - 37 U/L   ALT 20  0 - 35 U/L   Alkaline Phosphatase 87  39 - 117 U/L   Total Bilirubin 0.4  0.3 - 1.2 mg/dL   GFR calc non Af Amer >90  >90 mL/min   GFR calc Af Amer >90  >90 mL/min  LIPASE, BLOOD      Result Value Range   Lipase 27  11 - 59 U/L  URINALYSIS, ROUTINE W REFLEX MICROSCOPIC      Result Value Range   Color, Urine YELLOW  YELLOW   APPearance CLOUDY (*) CLEAR   Specific Gravity, Urine 1.018  1.005 - 1.030   pH 7.0  5.0 - 8.0   Glucose, UA 500 (*) NEGATIVE mg/dL   Hgb urine dipstick NEGATIVE  NEGATIVE   Bilirubin Urine NEGATIVE  NEGATIVE   Ketones, ur >80 (*) NEGATIVE mg/dL   Protein, ur NEGATIVE  NEGATIVE mg/dL   Urobilinogen, UA 0.2  0.0 - 1.0 mg/dL   Nitrite NEGATIVE  NEGATIVE   Leukocytes, UA NEGATIVE  NEGATIVE  GLUCOSE, CAPILLARY      Result Value Range   Glucose-Capillary 283 (*) 70 - 99 mg/dL  GLUCOSE, CAPILLARY      Result Value Range   Glucose-Capillary 272 (*) 70 - 99 mg/dL  POCT I-STAT 3, BLOOD GAS (G3P V)      Result  Value Range   pH, Ven 7.307 (*) 7.250 - 7.300   pCO2, Ven 56.1 (*) 45.0 - 50.0 mmHg   pO2, Ven 26.0 (*) 30.0 - 45.0 mmHg   Bicarbonate 28.3 (*) 20.0 - 24.0 mEq/L   TCO2 30  0 - 100 mmol/L   O2 Saturation 43.0     Acid-Base Excess 1.0  0.0 - 2.0 mmol/L   Patient temperature 97.0 F     Sample type VENOUS     Comment NOTIFIED PHYSICIAN     Anion gap = 20.   Imaging Review No results found.    MDM   1. Abdominal pain        Angus Sellereter S Zyana Amaro, PA-C 10/03/13 2254

## 2013-10-03 NOTE — Telephone Encounter (Signed)
Having severe abdominal pain, nausea and vomiting with SOB a few hrs after eating peppers and steak 12/31. Advised she go to ED.

## 2013-10-04 ENCOUNTER — Telehealth: Payer: Self-pay | Admitting: Internal Medicine

## 2013-10-04 NOTE — ED Provider Notes (Signed)
Medical screening examination/treatment/procedure(s) were performed by non-physician practitioner and as supervising physician I was immediately available for consultation/collaboration.    Frederico Gerling D Alem Fahl, MD 10/04/13 0000 

## 2013-10-04 NOTE — Telephone Encounter (Signed)
Patient reports she went to the ER with sever abdominal pain and bloating .  Reviewing the records she declined CT.  Patient reports she is not feeling better, has constipation, and bloating.  She is advised that I don't have any openings today and have scheduled her for Monday with Amy Esterwood PA.  She is advised that if her symptoms worsen or she develops new symptoms she will need to return to the ER.  She reports she has been taking tramadol for pain. She asks if she can take Miralax for constipation.  She is advised that ok to take Miralax as needed.  She will return to ER over the weekend if no improvement.

## 2013-10-06 ENCOUNTER — Emergency Department (HOSPITAL_COMMUNITY): Payer: 59

## 2013-10-06 ENCOUNTER — Inpatient Hospital Stay (HOSPITAL_COMMUNITY)
Admission: EM | Admit: 2013-10-06 | Discharge: 2013-10-13 | DRG: 330 | Disposition: A | Discharge status: Home / Self Care | Payer: 59 | Attending: General Surgery | Admitting: General Surgery

## 2013-10-06 ENCOUNTER — Encounter (HOSPITAL_COMMUNITY): Payer: Self-pay | Admitting: Emergency Medicine

## 2013-10-06 DIAGNOSIS — Z860100 Personal history of colon polyps, unspecified: Secondary | ICD-10-CM

## 2013-10-06 DIAGNOSIS — Z833 Family history of diabetes mellitus: Secondary | ICD-10-CM

## 2013-10-06 DIAGNOSIS — F411 Generalized anxiety disorder: Secondary | ICD-10-CM | POA: Diagnosis present

## 2013-10-06 DIAGNOSIS — E876 Hypokalemia: Secondary | ICD-10-CM | POA: Diagnosis not present

## 2013-10-06 DIAGNOSIS — N390 Urinary tract infection, site not specified: Secondary | ICD-10-CM | POA: Diagnosis present

## 2013-10-06 DIAGNOSIS — Z8371 Family history of colonic polyps: Secondary | ICD-10-CM

## 2013-10-06 DIAGNOSIS — I10 Essential (primary) hypertension: Secondary | ICD-10-CM | POA: Diagnosis present

## 2013-10-06 DIAGNOSIS — E1049 Type 1 diabetes mellitus with other diabetic neurological complication: Secondary | ICD-10-CM | POA: Diagnosis present

## 2013-10-06 DIAGNOSIS — E119 Type 2 diabetes mellitus without complications: Secondary | ICD-10-CM

## 2013-10-06 DIAGNOSIS — Z8249 Family history of ischemic heart disease and other diseases of the circulatory system: Secondary | ICD-10-CM

## 2013-10-06 DIAGNOSIS — Z83719 Family history of colon polyps, unspecified: Secondary | ICD-10-CM

## 2013-10-06 DIAGNOSIS — K56609 Unspecified intestinal obstruction, unspecified as to partial versus complete obstruction: Principal | ICD-10-CM | POA: Diagnosis present

## 2013-10-06 DIAGNOSIS — K565 Intestinal adhesions [bands], unspecified as to partial versus complete obstruction: Secondary | ICD-10-CM

## 2013-10-06 DIAGNOSIS — K3184 Gastroparesis: Secondary | ICD-10-CM | POA: Diagnosis present

## 2013-10-06 LAB — CBC WITH DIFFERENTIAL/PLATELET
Basophils Absolute: 0 10*3/uL (ref 0.0–0.1)
Basophils Relative: 0 % (ref 0–1)
EOS PCT: 0 % (ref 0–5)
Eosinophils Absolute: 0 10*3/uL (ref 0.0–0.7)
HEMATOCRIT: 42.5 % (ref 36.0–46.0)
HEMOGLOBIN: 14.9 g/dL (ref 12.0–15.0)
LYMPHS ABS: 0.7 10*3/uL (ref 0.7–4.0)
Lymphocytes Relative: 11 % — ABNORMAL LOW (ref 12–46)
MCH: 29.8 pg (ref 26.0–34.0)
MCHC: 35.1 g/dL (ref 30.0–36.0)
MCV: 85 fL (ref 78.0–100.0)
MONO ABS: 0.9 10*3/uL (ref 0.1–1.0)
Monocytes Relative: 13 % — ABNORMAL HIGH (ref 3–12)
Neutro Abs: 5.2 10*3/uL (ref 1.7–7.7)
Neutrophils Relative %: 76 % (ref 43–77)
Platelets: 290 10*3/uL (ref 150–400)
RBC: 5 MIL/uL (ref 3.87–5.11)
RDW: 14.1 % (ref 11.5–15.5)
WBC: 6.8 10*3/uL (ref 4.0–10.5)

## 2013-10-06 LAB — COMPREHENSIVE METABOLIC PANEL
ALBUMIN: 3.3 g/dL — AB (ref 3.5–5.2)
ALT: 17 U/L (ref 0–35)
AST: 27 U/L (ref 0–37)
Alkaline Phosphatase: 125 U/L — ABNORMAL HIGH (ref 39–117)
BUN: 23 mg/dL (ref 6–23)
CALCIUM: 10.1 mg/dL (ref 8.4–10.5)
CO2: 28 mEq/L (ref 19–32)
Chloride: 91 mEq/L — ABNORMAL LOW (ref 96–112)
Creatinine, Ser: 0.81 mg/dL (ref 0.50–1.10)
GFR calc Af Amer: 89 mL/min — ABNORMAL LOW (ref >90)
GFR calc non Af Amer: 77 mL/min — ABNORMAL LOW (ref >90)
Glucose, Bld: 255 mg/dL — ABNORMAL HIGH (ref 70–99)
Potassium: 5.3 mEq/L (ref 3.7–5.3)
SODIUM: 133 meq/L — AB (ref 137–147)
Total Bilirubin: 0.6 mg/dL (ref 0.3–1.2)
Total Protein: 8.2 g/dL (ref 6.0–8.3)

## 2013-10-06 LAB — GLUCOSE, CAPILLARY
GLUCOSE-CAPILLARY: 198 mg/dL — AB (ref 70–99)
GLUCOSE-CAPILLARY: 184 mg/dL — AB (ref 70–99)
Glucose-Capillary: 232 mg/dL — ABNORMAL HIGH (ref 70–99)

## 2013-10-06 LAB — POCT I-STAT 3, VENOUS BLOOD GAS (G3P V)
Acid-Base Excess: 1 mmol/L (ref 0.0–2.0)
Bicarbonate: 29.2 mEq/L — ABNORMAL HIGH (ref 20.0–24.0)
O2 Saturation: 15 %
PCO2 VEN: 55.1 mmHg — AB (ref 45.0–50.0)
TCO2: 31 mmol/L (ref 0–100)
pH, Ven: 7.332 — ABNORMAL HIGH (ref 7.250–7.300)
pO2, Ven: 14 mmHg — CL (ref 30.0–45.0)

## 2013-10-06 LAB — URINALYSIS, ROUTINE W REFLEX MICROSCOPIC
GLUCOSE, UA: NEGATIVE mg/dL
Ketones, ur: 40 mg/dL — AB
Nitrite: POSITIVE — AB
Protein, ur: >300 mg/dL — AB
SPECIFIC GRAVITY, URINE: 1.029 (ref 1.005–1.030)
Urobilinogen, UA: 1 mg/dL (ref 0.0–1.0)
pH: 7 (ref 5.0–8.0)

## 2013-10-06 LAB — LIPASE, BLOOD: LIPASE: 10 U/L — AB (ref 11–59)

## 2013-10-06 LAB — URINE MICROSCOPIC-ADD ON

## 2013-10-06 MED ORDER — DEXTROSE 5 % IV SOLN: 1.0000 g | INTRAVENOUS | Status: DC

## 2013-10-06 MED ORDER — IOHEXOL 300 MG/ML  SOLN
100.0000 mL | Freq: Once | INTRAMUSCULAR | Status: AC | PRN
Start: 2013-10-06 — End: 2013-10-06
  Administered 2013-10-06: 100 mL via INTRAVENOUS

## 2013-10-06 MED ORDER — ONDANSETRON HCL 4 MG/2ML IJ SOLN
4.0000 mg | Freq: Once | INTRAMUSCULAR | Status: AC
  Administered 2013-10-06: 4 mg via INTRAVENOUS
  Filled 2013-10-06: qty 2

## 2013-10-06 MED ORDER — ONDANSETRON HCL 4 MG/2ML IJ SOLN: 4.0000 mg | Freq: Four times a day (QID) | INTRAMUSCULAR | Status: DC | PRN

## 2013-10-06 MED ORDER — MORPHINE SULFATE 4 MG/ML IJ SOLN
4.0000 mg | Freq: Once | INTRAMUSCULAR | Status: AC
  Administered 2013-10-06: 4 mg via INTRAVENOUS
  Filled 2013-10-06: qty 1

## 2013-10-06 MED ORDER — BENZOCAINE 20 % MT SOLN
Freq: Once | OROMUCOSAL | Status: DC
  Filled 2013-10-06: qty 57

## 2013-10-06 MED ORDER — INFLUENZA VAC SPLIT QUAD 0.5 ML IM SUSP
0.5000 mL | INTRAMUSCULAR | Status: DC
  Filled 2013-10-06: qty 0.5

## 2013-10-06 MED ORDER — DIPHENHYDRAMINE HCL 12.5 MG/5ML PO ELIX: 12.5000 mg | ORAL_SOLUTION | Freq: Four times a day (QID) | ORAL | Status: DC | PRN

## 2013-10-06 MED ORDER — POTASSIUM CHLORIDE IN NACL 20-0.9 MEQ/L-% IV SOLN
INTRAVENOUS | Status: DC
  Administered 2013-10-06: 21:00:00 via INTRAVENOUS
  Filled 2013-10-06 (×3): qty 1000

## 2013-10-06 MED ORDER — INSULIN PUMP
SUBCUTANEOUS | Status: DC
  Administered 2013-10-07: 0.3 via SUBCUTANEOUS
  Administered 2013-10-07: 1.4 via SUBCUTANEOUS
  Administered 2013-10-08: 0.3 via SUBCUTANEOUS
  Administered 2013-10-08: 0.4 via SUBCUTANEOUS
  Administered 2013-10-09: 1.5 via SUBCUTANEOUS
  Administered 2013-10-09: 0.6 via SUBCUTANEOUS
  Administered 2013-10-09: 08:00:00 via SUBCUTANEOUS
  Filled 2013-10-06: qty 1

## 2013-10-06 MED ORDER — ENOXAPARIN SODIUM 40 MG/0.4ML ~~LOC~~ SOLN
40.0000 mg | SUBCUTANEOUS | Status: DC
  Administered 2013-10-06: 40 mg via SUBCUTANEOUS
  Filled 2013-10-06 (×2): qty 0.4

## 2013-10-06 MED ORDER — DEXTROSE 5 % IV SOLN
1.0000 g | INTRAVENOUS | Status: DC
  Filled 2013-10-06: qty 10

## 2013-10-06 MED ORDER — SODIUM CHLORIDE 0.9 % IV BOLUS (SEPSIS)
1000.0000 mL | Freq: Once | INTRAVENOUS | Status: AC
  Administered 2013-10-06: 1000 mL via INTRAVENOUS

## 2013-10-06 MED ORDER — DEXTROSE 5 % IV SOLN
1.0000 g | Freq: Once | INTRAVENOUS | Status: AC
  Administered 2013-10-06: 1 g via INTRAVENOUS
  Filled 2013-10-06: qty 10

## 2013-10-06 MED ORDER — PNEUMOCOCCAL VAC POLYVALENT 25 MCG/0.5ML IJ INJ
0.5000 mL | INJECTION | INTRAMUSCULAR | Status: DC
  Filled 2013-10-06: qty 0.5

## 2013-10-06 MED ORDER — DIPHENHYDRAMINE HCL 50 MG/ML IJ SOLN
12.5000 mg | Freq: Four times a day (QID) | INTRAMUSCULAR | Status: DC | PRN
  Administered 2013-10-10 – 2013-10-13 (×4): 25 mg via INTRAVENOUS
  Filled 2013-10-06 (×4): qty 1

## 2013-10-06 MED ORDER — PANTOPRAZOLE SODIUM 40 MG IV SOLR
40.0000 mg | Freq: Every day | INTRAVENOUS | Status: DC
  Administered 2013-10-07 – 2013-10-12 (×7): 40 mg via INTRAVENOUS
  Filled 2013-10-06 (×10): qty 40

## 2013-10-06 MED ORDER — MORPHINE SULFATE 2 MG/ML IJ SOLN
1.0000 mg | INTRAMUSCULAR | Status: DC | PRN
  Administered 2013-10-06 (×2): 2 mg via INTRAVENOUS
  Administered 2013-10-06 – 2013-10-07 (×3): 4 mg via INTRAVENOUS
  Administered 2013-10-07: 2 mg via INTRAVENOUS
  Administered 2013-10-07: 4 mg via INTRAVENOUS
  Administered 2013-10-07: 2 mg via INTRAVENOUS
  Filled 2013-10-06 (×3): qty 2
  Filled 2013-10-06 (×2): qty 1
  Filled 2013-10-06 (×2): qty 2

## 2013-10-06 MED ORDER — INSULIN PUMP
SUBCUTANEOUS | Status: DC
  Filled 2013-10-06: qty 1

## 2013-10-06 MED ORDER — MORPHINE SULFATE 2 MG/ML IJ SOLN: 1.0000 mg | INTRAMUSCULAR | Status: DC | PRN

## 2013-10-06 NOTE — ED Notes (Signed)
Patient transported to CT 

## 2013-10-06 NOTE — ED Notes (Signed)
Pt c/o n/v/abd pain since eating a steak on Wednesday night. It was the first meat shes eaten in 3 years. Her husband ate same meat and is not ill. shes had trouble controlling blood sugars since. She has an insulin pump.

## 2013-10-06 NOTE — ED Provider Notes (Addendum)
CSN: [id number]     Arrival date & time 10/06/13  1020 History   First MD Initiated Contact with Patient 10/06/13 1116     Chief Complaint  Patient presents with  . Emesis   (Consider location/radiation/quality/duration/timing/severity/associated sxs/prior Treatment) Patient is a 64 y.o. female presenting with abdominal pain. The history is provided by the patient.  Abdominal Pain Pain location:  Generalized Pain quality: bloating, gnawing, pressure and shooting   Pain radiates to:  Does not radiate Pain severity:  Severe Onset quality:  Gradual Duration:  5 days Timing:  Constant Progression:  Worsening Chronicity:  New Context comment:  Started 5 days ago after eating steak for the first time in years Relieved by:  Nothing Worsened by:  Eating (and drinking anything carbinated) Associated symptoms: anorexia, dysuria, nausea and vomiting   Associated symptoms: no chest pain, no chills, no diarrhea, no fever, no flatus and no shortness of breath   Associated symptoms comment:  No bm's for the last 5 days and no flatus Risk factors: no NSAID use and not obese   Risk factors comment:  Type 1 DM   Past Medical History  Diagnosis Date  . Gastroparesis   . Intestinal bacterial overgrowth   . Diabetes mellitus   . Benign colon polyp   . Diarrhea    Past Surgical History  Procedure Laterality Date  . Abdominal hysterectomy    . Breast surgery    . Appendectomy    . Resection of lymph node in neck     Family History  Problem Relation Age of Onset  . Colon cancer Neg Hx   . Colon polyps Father   . Heart disease Father   . Heart disease Brother   . Diabetes Mother   . Diabetes Father    History  Substance Use Topics  . Smoking status: Never Smoker   . Smokeless tobacco: Never Used  . Alcohol Use: Yes   OB History   Grav Para Term Preterm Abortions TAB SAB Ect Mult Living                 Review of Systems  Constitutional: Negative for fever and chills.   Respiratory: Negative for shortness of breath.   Cardiovascular: Negative for chest pain.  Gastrointestinal: Positive for nausea, vomiting, abdominal pain and anorexia. Negative for diarrhea and flatus.  Genitourinary: Positive for dysuria.  All other systems reviewed and are negative.    Allergies  Review of patient's allergies indicates no known allergies.  Home Medications   Current Outpatient Rx  Name  Route  Sig  Dispense  Refill  . CALCIUM PO   Oral   Take 1 tablet by mouth daily.         . CHROMIUM PO   Oral   Take 1 tablet by mouth daily.         Marland Kitchen DIOVAN 80 MG tablet   Oral   Take 80 mg by mouth daily.          . Insulin Human (INSULIN PUMP) 100 unit/ml SOLN   Subcutaneous   Inject into the skin continuous.         Marland Kitchen MAGNESIUM PO   Oral   Take 1 tablet by mouth daily.         . Probiotic Product (PROBIOTIC PO)   Oral   Take 1 tablet by mouth daily.         . traMADol (ULTRAM) 50 MG tablet   Oral  Take 1 tablet (50 mg total) by mouth every 6 (six) hours as needed.   15 tablet   0    BP 123/98  Pulse 112  Temp(Src) 97.8 F (36.6 C) (Oral)  Resp 22  SpO2 98% Physical Exam  Nursing note and vitals reviewed. Constitutional: She is oriented to person, place, and time. She appears well-developed and well-nourished. No distress.  HENT:  Head: Normocephalic and atraumatic.  Mouth/Throat: Oropharynx is clear and moist. Mucous membranes are dry.  Eyes: Conjunctivae and EOM are normal. Pupils are equal, round, and reactive to light.  Neck: Normal range of motion. Neck supple.  Cardiovascular: Regular rhythm and intact distal pulses.  Tachycardia present.   No murmur heard. Pulmonary/Chest: Effort normal and breath sounds normal. No respiratory distress. She has no wheezes. She has no rales.  Abdominal: Soft. She exhibits distension. There is generalized tenderness. There is guarding. There is no rebound.  Musculoskeletal: Normal range of  motion. She exhibits no edema and no tenderness.  Neurological: She is alert and oriented to person, place, and time.  Skin: Skin is warm and dry. No rash noted. No erythema.  Psychiatric: She has a normal mood and affect. Her behavior is normal.    ED Course  Procedures (including critical care time) Labs Review Labs Reviewed  GLUCOSE, CAPILLARY - Abnormal; Notable for the following:    Glucose-Capillary 232 (*)    All other components within normal limits  LIPASE, BLOOD - Abnormal; Notable for the following:    Lipase 10 (*)    All other components within normal limits  COMPREHENSIVE METABOLIC PANEL - Abnormal; Notable for the following:    Sodium 133 (*)    Chloride 91 (*)    Glucose, Bld 255 (*)    Albumin 3.3 (*)    Alkaline Phosphatase 125 (*)    GFR calc non Af Amer 77 (*)    GFR calc Af Amer 89 (*)    All other components within normal limits  CBC WITH DIFFERENTIAL - Abnormal; Notable for the following:    Lymphocytes Relative 11 (*)    Monocytes Relative 13 (*)    All other components within normal limits  URINALYSIS, ROUTINE W REFLEX MICROSCOPIC   Imaging Review Ct Abdomen Pelvis W Contrast  10/06/2013   CLINICAL DATA:  Nausea, vomiting and abdominal pain.  EXAM: CT ABDOMEN AND PELVIS WITH CONTRAST  TECHNIQUE: Multidetector CT imaging of the abdomen and pelvis was performed using the standard protocol following bolus administration of intravenous contrast.  CONTRAST:  OMNIPAQUE IOHEXOL 300 MG/ML  SOLN  COMPARISON:  CT of the abdomen and pelvis 06/04/2011.  FINDINGS: Lung Bases: Trace right pleural effusion.  Small hiatal hernia.  Abdomen/Pelvis: Mild periportal edema in the liver. The appearance of the liver is otherwise unremarkable. The appearance of the gallbladder, pancreas, spleen, bilateral adrenal glands and bilateral kidneys is unremarkable. Small volume of ascites, most pronounced in the low anatomic pelvis. In the low anatomic pelvis there is some apparent  peritoneal enhancement, which could indicate some peritonitis. Extensive dilatation of the small bowel which generally measures up to approximately 4.4 cm in diameter. However, shortly before the terminal ileum there is massive dilatation of the distal ileum which measures up to 7.3 cm in diameter. Beyond this there is abrupt transition to completely decompressed terminal ileum and nearly completely decompressed colon. The stomach is also essentially completely decompressed, totally distorted related to mass effect from the adjacent dilated loops of small bowel. No pneumoperitoneum.  Musculoskeletal: There are no aggressive appearing lytic or blastic lesions noted in the visualized portions of the skeleton.  IMPRESSION: 1. Findings, as above, compatible with small bowel obstruction. There is an apparent transition point in the region of the distal/terminal ileum, as discussed above. Surgical consultation is recommended. 2. Small volume of ascites, most pronounced in the pelvis where there is some peritoneal enhancement. This may indicate peritonitis. The possibility of perforation is difficult to entirely exclude, but is not strongly favored at this time. No frank pneumoperitoneum is noted. 3. Trace right pleural effusion. 4. Small hiatal hernia.   Electronically Signed   By: Trudie Reedaniel  Entrikin M.D.   On: 10/06/2013 14:54   Dg Abd Acute W/chest  10/06/2013   CLINICAL DATA:  Abdominal pain with distention.  EXAM: ACUTE ABDOMEN SERIES (ABDOMEN 2 VIEW & CHEST 1 VIEW)  COMPARISON:  CT 06/14/2011  FINDINGS: Chest radiograph demonstrates clear lungs. Normal appearance of the heart and mediastinum. The trachea is midline. There are multiple air-fluid levels in the small bowel with small bowel distention. Lucency underneath the left hemidiaphragm could be related to the stomach. Small bowel loops measure up to 4.1 cm on the supine images. 1.2 cm oval-shaped radiopaque structure in the right hemipelvis of uncertain etiology.  This may represent an ingested structure. No significant gas within the colon.  IMPRESSION: Small bowel dilatation with multiple air-fluid levels. Findings are suggestive for a high-grade small-bowel obstruction. Lucency underneath the left hemidiaphragm may be related to the stomach but difficult to completely exclude free air. Recommend further evaluation with CT.  No acute chest findings.  These results were called by telephone at the time of interpretation on 10/06/2013 at 1:08 PM to Dr. Gwyneth SproutWHITNEY Urijah Arko , who verbally acknowledged these results.   Electronically Signed   By: Richarda OverlieAdam  Henn M.D.   On: 10/06/2013 13:09    EKG Interpretation   None       MDM   1. Small bowel obstruction     Pt with abd pain that started 5 days PTA.  She has had occasional vomiting and nausea, and no bowel movements and no gas since pain started 5 days ago. She was initially seen in the emergency room on the date symptoms started and was strongly urged to get a CAT scan however patient stated that oral contrast made her stomach hurt so bad she decided not to get the CAT scan and went home. However she's continued to have issues since that time. She also is a type I diabetic on an insulin pump and has had a hard time controlling her blood sugar. It has been in the 200s consistently. Also patient has only been 3 tablespoons of apple sauce and half piece of toast in the last 4 days. On exam she has abdominal distention and diffuse tenderness and decreased bowel sounds. She denies fever but appears dehydrated and is mildly tachycardic. Blood pressure is within normal limits. Concern for possible bowel perforation versus obstruction.  CBC and lipase are within normal limits. CMP is within normal limits except for hyperglycemia of 255. UA is pending and imaging is pending. Patient was given IV fluids, pain and nausea medication.  1:58 PM AAS with signs of high grade obstruction without overt perforation.  CT with contrast  ordered and NGT placed.  3:08 PM CT consistent with small bowel obstruction with transition point in the terminal ileum.  Surgery consulted. Also UTI and pt given rocephin. Gwyneth SproutWhitney Itali Mckendry, MD 10/06/13 1354  Gwyneth SproutWhitney Blade Scheff, MD  10/06/13 1509  Gwyneth Sprout, MD 10/06/13 1548

## 2013-10-06 NOTE — H&P (Signed)
[place] 1951/02/01  [id number].   Primary Care MD: Dr. Domenick Gong Chief Complaint/Reason for Consult: SBO HPI: this is a 63 yo female who has a h/o type I DM with gastroparesis.  This past Wednesday she ate steak and high fiber supper.  She then developed crampy abdominal pain.  She came to the ED after that for evaluation, but ended up going back home with tramadol for pain.  She was unable to drink contrast and therefore the CT scan was forfeited.  She has continued to have some nausea and emesis.  She has not passed flatus or a BM since Wednesday.  She comes in today with a CT scan that reveals a high grade SBO.  We have been asked to see her for admission.  ROS: Please see HPI, otherwise all other systems are negative except for dysuria  Family History  Problem Relation Age of Onset  . Colon cancer Neg Hx   . Colon polyps Father   . Heart disease Father   . Heart disease Brother   . Diabetes Mother   . Diabetes Father     Past Medical History  Diagnosis Date  . Gastroparesis   . Intestinal bacterial overgrowth   . Diabetes mellitus   . Benign colon polyp   . Diarrhea     Past Surgical History  Procedure Laterality Date  . Abdominal hysterectomy    . Breast surgery    . Appendectomy    . Resection of lymph node in neck      Social History:  reports that she has never smoked. She has never used smokeless tobacco. She reports that she drinks alcohol. She reports that she does not use illicit drugs.  Allergies: No Known Allergies   (Not in a hospital admission)  Blood pressure 132/77, pulse 96, temperature 97.8 F (36.6 C), temperature source Oral, resp. rate 22, SpO2 97.00%. Physical Exam: General: pleasant, WD, WN white female who is laying in bed in NAD HEENT: head is normocephalic, atraumatic.  Sclera are noninjected.  PERRL.  Ears and nose without any masses or lesions.  Mouth is pink and moist Heart: regular, rate, and rhythm.  Normal s1,s2. No obvious  murmurs, gallops, or rubs noted.  Palpable radial and pedal pulses bilaterally Lungs: CTAB, no wheezes, rhonchi, or rales noted.  Respiratory effort nonlabored Abd: tight distended, tympanitic, absent BS, diffusely tender, but guarding, and possible early peritoneal signs.  NGT in place with minimal output of just clear liquid, from water she drank. MS: all 4 extremities are symmetrical with no cyanosis, clubbing, or edema. Skin: warm and dry with no masses, lesions, or rashes Psych: A&Ox3 with an appropriate affect.    Results for orders placed during the hospital encounter of 10/06/13 (from the past 48 hour(s))  GLUCOSE, CAPILLARY     Status: Abnormal   Collection Time    10/06/13 10:48 AM      Result Value Range   Glucose-Capillary 232 (*) 70 - 99 mg/dL  LIPASE, BLOOD     Status: Abnormal   Collection Time    10/06/13 11:06 AM      Result Value Range   Lipase 10 (*) 11 - 59 U/L  COMPREHENSIVE METABOLIC PANEL     Status: Abnormal   Collection Time    10/06/13 11:06 AM      Result Value Range   Sodium 133 (*) 137 - 147 mEq/L   Comment: Please note change in reference range.   Potassium 5.3  3.7 - 5.3 mEq/L   Comment: Please note change in reference range.   Chloride 91 (*) 96 - 112 mEq/L   CO2 28  19 - 32 mEq/L   Glucose, Bld 255 (*) 70 - 99 mg/dL   BUN 23  6 - 23 mg/dL   Creatinine, Ser 0.81  0.50 - 1.10 mg/dL   Calcium 10.1  8.4 - 10.5 mg/dL   Total Protein 8.2  6.0 - 8.3 g/dL   Albumin 3.3 (*) 3.5 - 5.2 g/dL   AST 27  0 - 37 U/L   ALT 17  0 - 35 U/L   Alkaline Phosphatase 125 (*) 39 - 117 U/L   Total Bilirubin 0.6  0.3 - 1.2 mg/dL   GFR calc non Af Amer 77 (*) >90 mL/min   GFR calc Af Amer 89 (*) >90 mL/min   Comment: (NOTE)     The eGFR has been calculated using the CKD EPI equation.     This calculation has not been validated in all clinical situations.     eGFR's persistently <90 mL/min signify possible Chronic Kidney     Disease.  CBC WITH DIFFERENTIAL      Status: Abnormal   Collection Time    10/06/13 11:06 AM      Result Value Range   WBC 6.8  4.0 - 10.5 K/uL   RBC 5.00  3.87 - 5.11 MIL/uL   Hemoglobin 14.9  12.0 - 15.0 g/dL   HCT 42.5  36.0 - 46.0 %   MCV 85.0  78.0 - 100.0 fL   MCH 29.8  26.0 - 34.0 pg   MCHC 35.1  30.0 - 36.0 g/dL   RDW 14.1  11.5 - 15.5 %   Platelets 290  150 - 400 K/uL   Neutrophils Relative % 76  43 - 77 %   Neutro Abs 5.2  1.7 - 7.7 K/uL   Lymphocytes Relative 11 (*) 12 - 46 %   Lymphs Abs 0.7  0.7 - 4.0 K/uL   Monocytes Relative 13 (*) 3 - 12 %   Monocytes Absolute 0.9  0.1 - 1.0 K/uL   Eosinophils Relative 0  0 - 5 %   Eosinophils Absolute 0.0  0.0 - 0.7 K/uL   Basophils Relative 0  0 - 1 %   Basophils Absolute 0.0  0.0 - 0.1 K/uL  URINALYSIS, ROUTINE W REFLEX MICROSCOPIC     Status: Abnormal   Collection Time    10/06/13  1:44 PM      Result Value Range   Color, Urine ORANGE (*) YELLOW   Comment: BIOCHEMICALS MAY BE AFFECTED BY COLOR   APPearance TURBID (*) CLEAR   Specific Gravity, Urine 1.029  1.005 - 1.030   pH 7.0  5.0 - 8.0   Glucose, UA NEGATIVE  NEGATIVE mg/dL   Hgb urine dipstick SMALL (*) NEGATIVE   Bilirubin Urine SMALL (*) NEGATIVE   Ketones, ur 40 (*) NEGATIVE mg/dL   Protein, ur >300 (*) NEGATIVE mg/dL   Urobilinogen, UA 1.0  0.0 - 1.0 mg/dL   Nitrite POSITIVE (*) NEGATIVE   Leukocytes, UA SMALL (*) NEGATIVE  URINE MICROSCOPIC-ADD ON     Status: Abnormal   Collection Time    10/06/13  1:44 PM      Result Value Range   Squamous Epithelial / LPF FEW (*) RARE   WBC, UA 7-10  <3 WBC/hpf   RBC / HPF 0-2  <3 RBC/hpf   Bacteria, UA MANY (*)  RARE  POCT I-STAT 3, BLOOD GAS (G3P V)     Status: Abnormal   Collection Time    10/06/13  1:54 PM      Result Value Range   pH, Ven 7.332 (*) 7.250 - 7.300   pCO2, Ven 55.1 (*) 45.0 - 50.0 mmHg   pO2, Ven 14.0 (*) 30.0 - 45.0 mmHg   Bicarbonate 29.2 (*) 20.0 - 24.0 mEq/L   TCO2 31  0 - 100 mmol/L   O2 Saturation 15.0     Acid-Base Excess  1.0  0.0 - 2.0 mmol/L   Sample type VENOUS     Comment NOTIFIED PHYSICIAN     Ct Abdomen Pelvis W Contrast  10/06/2013   CLINICAL DATA:  Nausea, vomiting and abdominal pain.  EXAM: CT ABDOMEN AND PELVIS WITH CONTRAST  TECHNIQUE: Multidetector CT imaging of the abdomen and pelvis was performed using the standard protocol following bolus administration of intravenous contrast.  CONTRAST:  147mL OMNIPAQUE IOHEXOL 300 MG/ML  SOLN  COMPARISON:  CT of the abdomen and pelvis 06/04/2011.  FINDINGS: Lung Bases: Trace right pleural effusion.  Small hiatal hernia.  Abdomen/Pelvis: Mild periportal edema in the liver. The appearance of the liver is otherwise unremarkable. The appearance of the gallbladder, pancreas, spleen, bilateral adrenal glands and bilateral kidneys is unremarkable. Small volume of ascites, most pronounced in the low anatomic pelvis. In the low anatomic pelvis there is some apparent peritoneal enhancement, which could indicate some peritonitis. Extensive dilatation of the small bowel which generally measures up to approximately 4.4 cm in diameter. However, shortly before the terminal ileum there is massive dilatation of the distal ileum which measures up to 7.3 cm in diameter. Beyond this there is abrupt transition to completely decompressed terminal ileum and nearly completely decompressed colon. The stomach is also essentially completely decompressed, totally distorted related to mass effect from the adjacent dilated loops of small bowel. No pneumoperitoneum.  Musculoskeletal: There are no aggressive appearing lytic or blastic lesions noted in the visualized portions of the skeleton.  IMPRESSION: 1. Findings, as above, compatible with small bowel obstruction. There is an apparent transition point in the region of the distal/terminal ileum, as discussed above. Surgical consultation is recommended. 2. Small volume of ascites, most pronounced in the pelvis where there is some peritoneal enhancement. This  may indicate peritonitis. The possibility of perforation is difficult to entirely exclude, but is not strongly favored at this time. No frank pneumoperitoneum is noted. 3. Trace right pleural effusion. 4. Small hiatal hernia.   Electronically Signed   By: Vinnie Langton M.D.   On: 10/06/2013 14:54   Dg Abd Acute W/chest  10/06/2013   CLINICAL DATA:  Abdominal pain with distention.  EXAM: ACUTE ABDOMEN SERIES (ABDOMEN 2 VIEW & CHEST 1 VIEW)  COMPARISON:  CT 06/14/2011  FINDINGS: Chest radiograph demonstrates clear lungs. Normal appearance of the heart and mediastinum. The trachea is midline. There are multiple air-fluid levels in the small bowel with small bowel distention. Lucency underneath the left hemidiaphragm could be related to the stomach. Small bowel loops measure up to 4.1 cm on the supine images. 1.2 cm oval-shaped radiopaque structure in the right hemipelvis of uncertain etiology. This may represent an ingested structure. No significant gas within the colon.  IMPRESSION: Small bowel dilatation with multiple air-fluid levels. Findings are suggestive for a high-grade small-bowel obstruction. Lucency underneath the left hemidiaphragm may be related to the stomach but difficult to completely exclude free air. Recommend further evaluation with CT.  No acute chest findings.  These results were called by telephone at the time of interpretation on 10/06/2013 at 1:08 PM to Dr. Blanchie Dessert , who verbally acknowledged these results.   Electronically Signed   By: Markus Daft M.D.   On: 10/06/2013 13:09       Assessment/Plan 1. High grade SBO 2. Type I DM 3. UTI 4. Diabetic gastroparesis  Plan: 1. We will get the patient admitted and continue with NGT placement.  Repeat films in the morning.  If she is not much better than I am concerned she may need surgical intervention sooner rather than later. 2. Dr. Anay Walter Casco will see the patient in the morning to assist with management of her insulin pump.  She  will keep her insulin pump and control it herself. 3. Rocephin for 3 days for a UTI 4. IVFs and bowel rest  Riot Waterworth E 10/06/2013, 4:28 PM Pager: [phone]

## 2013-10-06 NOTE — H&P (Signed)
Seen, agree with above. Low threshold for surgical intervention.  If no improvement by tomorrow AM, will likely need ex lap.     Unclear how this relates to eating meat.   Start TNA NPO NGT IVF Repeat films in AM.

## 2013-10-06 NOTE — ED Notes (Signed)
Advanced NG tube to next black notch per Barnetta ChapelKelly Osborne, PAC.

## 2013-10-07 ENCOUNTER — Observation Stay (HOSPITAL_COMMUNITY): Payer: 59 | Admitting: Anesthesiology

## 2013-10-07 ENCOUNTER — Observation Stay (HOSPITAL_COMMUNITY): Payer: 59

## 2013-10-07 ENCOUNTER — Encounter (HOSPITAL_COMMUNITY): Admission: EM | Disposition: A | Discharge status: Home / Self Care | Payer: Self-pay | Source: Home / Self Care

## 2013-10-07 ENCOUNTER — Encounter (HOSPITAL_COMMUNITY): Payer: 59 | Admitting: Anesthesiology

## 2013-10-07 ENCOUNTER — Encounter (HOSPITAL_COMMUNITY): Payer: Self-pay | Admitting: Anesthesiology

## 2013-10-07 ENCOUNTER — Ambulatory Visit: Payer: 59 | Admitting: Physician Assistant

## 2013-10-07 DIAGNOSIS — K562 Volvulus: Secondary | ICD-10-CM

## 2013-10-07 HISTORY — PX: LAPAROTOMY: SHX154

## 2013-10-07 HISTORY — PX: LYSIS OF ADHESION: SHX5961

## 2013-10-07 HISTORY — PX: ILEOCECETOMY: SHX5857

## 2013-10-07 LAB — GLUCOSE, CAPILLARY
GLUCOSE-CAPILLARY: 129 mg/dL — AB (ref 70–99)
GLUCOSE-CAPILLARY: 174 mg/dL — AB (ref 70–99)
GLUCOSE-CAPILLARY: 58 mg/dL — AB (ref 70–99)
GLUCOSE-CAPILLARY: 229 mg/dL — AB (ref 70–99)
GLUCOSE-CAPILLARY: 241 mg/dL — AB (ref 70–99)
Glucose-Capillary: 112 mg/dL — ABNORMAL HIGH (ref 70–99)
Glucose-Capillary: 127 mg/dL — ABNORMAL HIGH (ref 70–99)
Glucose-Capillary: 172 mg/dL — ABNORMAL HIGH (ref 70–99)
Glucose-Capillary: 184 mg/dL — ABNORMAL HIGH (ref 70–99)
Glucose-Capillary: 78 mg/dL (ref 70–99)
Glucose-Capillary: 99 mg/dL (ref 70–99)

## 2013-10-07 LAB — BASIC METABOLIC PANEL
BUN: 17 mg/dL (ref 6–23)
CO2: 28 mEq/L (ref 19–32)
Calcium: 8.4 mg/dL (ref 8.4–10.5)
Chloride: 102 mEq/L (ref 96–112)
Creatinine, Ser: 0.75 mg/dL (ref 0.50–1.10)
GFR calc Af Amer: >90 mL/min (ref >90)
GFR, EST NON AFRICAN AMERICAN: 90 mL/min — AB (ref >90)
GLUCOSE: 122 mg/dL — AB (ref 70–99)
Potassium: 4.4 mEq/L (ref 3.7–5.3)
Sodium: 140 mEq/L (ref 137–147)

## 2013-10-07 LAB — CBC
HCT: 35.3 % — ABNORMAL LOW (ref 36.0–46.0)
Hemoglobin: 12.2 g/dL (ref 12.0–15.0)
MCH: 28.9 pg (ref 26.0–34.0)
MCHC: 34.6 g/dL (ref 30.0–36.0)
MCV: 83.6 fL (ref 78.0–100.0)
Platelets: 226 10*3/uL (ref 150–400)
RBC: 4.22 MIL/uL (ref 3.87–5.11)
RDW: 14.3 % (ref 11.5–15.5)
WBC: 3.1 10*3/uL — ABNORMAL LOW (ref 4.0–10.5)

## 2013-10-07 LAB — POCT I-STAT EG7
Acid-Base Excess: 3 mmol/L — ABNORMAL HIGH (ref 0.0–2.0)
Bicarbonate: 28.4 mEq/L — ABNORMAL HIGH (ref 20.0–24.0)
Calcium, Ion: 1.1 mmol/L — ABNORMAL LOW (ref 1.13–1.30)
HCT: 37 % (ref 36.0–46.0)
Hemoglobin: 12.6 g/dL (ref 12.0–15.0)
O2 SAT: 77 %
POTASSIUM: 3.3 meq/L — AB (ref 3.7–5.3)
Patient temperature: 36.8
SODIUM: 142 meq/L (ref 137–147)
TCO2: 30 mmol/L (ref 0–100)
pCO2, Ven: 45.9 mmHg (ref 45.0–50.0)
pH, Ven: 7.399 — ABNORMAL HIGH (ref 7.250–7.300)
pO2, Ven: 42 mmHg (ref 30.0–45.0)

## 2013-10-07 LAB — SURGICAL PCR SCREEN
MRSA, PCR: NEGATIVE
Staphylococcus aureus: NEGATIVE

## 2013-10-07 LAB — POCT I-STAT GLUCOSE
Glucose, Bld: 109 mg/dL — ABNORMAL HIGH (ref 70–99)
Operator id: 178832

## 2013-10-07 SURGERY — LAPAROTOMY, EXPLORATORY
Anesthesia: General

## 2013-10-07 MED ORDER — ENOXAPARIN SODIUM 40 MG/0.4ML ~~LOC~~ SOLN
40.0000 mg | SUBCUTANEOUS | Status: DC
Start: 2013-10-08 — End: 2013-10-13
  Administered 2013-10-08 – 2013-10-12 (×5): 40 mg via SUBCUTANEOUS
  Filled 2013-10-07 (×6): qty 0.4

## 2013-10-07 MED ORDER — OXYCODONE HCL 5 MG/5ML PO SOLN: 5.0000 mg | Freq: Once | ORAL | Status: DC | PRN

## 2013-10-07 MED ORDER — NALOXONE HCL 0.4 MG/ML IJ SOLN: 0.4000 mg | INTRAMUSCULAR | Status: DC | PRN

## 2013-10-07 MED ORDER — ONDANSETRON HCL 4 MG/2ML IJ SOLN
4.0000 mg | INTRAMUSCULAR | Status: DC | PRN
  Administered 2013-10-08 – 2013-10-09 (×4): 4 mg via INTRAVENOUS
  Filled 2013-10-07 (×4): qty 2

## 2013-10-07 MED ORDER — CEFAZOLIN SODIUM 1-5 GM-% IV SOLN
1.0000 g | Freq: Once | INTRAVENOUS | Status: AC
Start: 2013-10-07 — End: 2013-10-07
  Administered 2013-10-07: 2 g via INTRAVENOUS
  Filled 2013-10-07: qty 50

## 2013-10-07 MED ORDER — TRACE MINERALS CR-CU-F-FE-I-MN-MO-SE-ZN IV SOLN
INTRAVENOUS | Status: DC
  Filled 2013-10-07: qty 1000

## 2013-10-07 MED ORDER — NEOSTIGMINE METHYLSULFATE 1 MG/ML IJ SOLN
INTRAMUSCULAR | Status: DC | PRN
  Administered 2013-10-07: 4 mg via INTRAVENOUS

## 2013-10-07 MED ORDER — FENTANYL CITRATE 0.05 MG/ML IJ SOLN
INTRAMUSCULAR | Status: DC | PRN
  Administered 2013-10-07: 100 ug via INTRAVENOUS
  Administered 2013-10-07 (×2): 50 ug via INTRAVENOUS

## 2013-10-07 MED ORDER — DIPHENHYDRAMINE HCL 50 MG/ML IJ SOLN: 12.5000 mg | Freq: Four times a day (QID) | INTRAMUSCULAR | Status: DC | PRN

## 2013-10-07 MED ORDER — PHENYLEPHRINE HCL 10 MG/ML IJ SOLN
INTRAMUSCULAR | Status: DC | PRN
  Administered 2013-10-07: 80 ug via INTRAVENOUS

## 2013-10-07 MED ORDER — SUCCINYLCHOLINE CHLORIDE 20 MG/ML IJ SOLN
INTRAMUSCULAR | Status: DC | PRN
  Administered 2013-10-07: 100 mg via INTRAVENOUS

## 2013-10-07 MED ORDER — PROPOFOL 10 MG/ML IV BOLUS
INTRAVENOUS | Status: DC | PRN
  Administered 2013-10-07: 140 mg via INTRAVENOUS

## 2013-10-07 MED ORDER — DEXTROSE 50 % IV SOLN
INTRAVENOUS | Status: AC
  Filled 2013-10-07: qty 50

## 2013-10-07 MED ORDER — ALBUMIN HUMAN 5 % IV SOLN
INTRAVENOUS | Status: DC | PRN
  Administered 2013-10-07: 14:00:00 via INTRAVENOUS

## 2013-10-07 MED ORDER — INSULIN PUMP: SUBCUTANEOUS | Status: DC

## 2013-10-07 MED ORDER — ONDANSETRON HCL 4 MG/2ML IJ SOLN
INTRAMUSCULAR | Status: DC | PRN
  Administered 2013-10-07: 4 mg via INTRAVENOUS

## 2013-10-07 MED ORDER — SODIUM CHLORIDE 0.9 % IJ SOLN: 9.0000 mL | INTRAMUSCULAR | Status: DC | PRN

## 2013-10-07 MED ORDER — PROMETHAZINE HCL 25 MG/ML IJ SOLN: 6.2500 mg | INTRAMUSCULAR | Status: DC | PRN

## 2013-10-07 MED ORDER — 0.9 % SODIUM CHLORIDE (POUR BTL) OPTIME
TOPICAL | Status: DC | PRN
  Administered 2013-10-07: 1000 mL
  Administered 2013-10-07: 2000 mL

## 2013-10-07 MED ORDER — SODIUM CHLORIDE 0.9 % IV SOLN
INTRAVENOUS | Status: AC
  Administered 2013-10-07: 08:00:00 via INTRAVENOUS
  Filled 2013-10-07: qty 1000

## 2013-10-07 MED ORDER — GLYCOPYRROLATE 0.2 MG/ML IJ SOLN
INTRAMUSCULAR | Status: DC | PRN
  Administered 2013-10-07: 0.6 mg via INTRAVENOUS

## 2013-10-07 MED ORDER — MORPHINE SULFATE (PF) 1 MG/ML IV SOLN
INTRAVENOUS | Status: DC
  Administered 2013-10-07: 12 mg via INTRAVENOUS
  Administered 2013-10-07 – 2013-10-08 (×2): via INTRAVENOUS
  Administered 2013-10-08: 7.5 mg via INTRAVENOUS
  Filled 2013-10-07 (×2): qty 25

## 2013-10-07 MED ORDER — OXYCODONE HCL 5 MG PO TABS: 5.0000 mg | ORAL_TABLET | Freq: Once | ORAL | Status: DC | PRN

## 2013-10-07 MED ORDER — SODIUM CHLORIDE 0.9 % IV SOLN
INTRAVENOUS | Status: DC
  Administered 2013-10-07 – 2013-10-08 (×2): via INTRAVENOUS

## 2013-10-07 MED ORDER — LACTATED RINGERS IV SOLN
INTRAVENOUS | Status: DC | PRN
  Administered 2013-10-07: 13:00:00 via INTRAVENOUS

## 2013-10-07 MED ORDER — PIPERACILLIN-TAZOBACTAM 3.375 G IVPB
3.3750 g | Freq: Three times a day (TID) | INTRAVENOUS | Status: DC
  Administered 2013-10-07 – 2013-10-11 (×10): 3.375 g via INTRAVENOUS
  Filled 2013-10-07 (×13): qty 50

## 2013-10-07 MED ORDER — MIDAZOLAM HCL 5 MG/5ML IJ SOLN
INTRAMUSCULAR | Status: DC | PRN
  Administered 2013-10-07: 2 mg via INTRAVENOUS

## 2013-10-07 MED ORDER — SODIUM BICARBONATE 4.2 % IV SOLN
INTRAVENOUS | Status: DC | PRN
  Administered 2013-10-07: 50 meq via INTRAVENOUS

## 2013-10-07 MED ORDER — HYDROMORPHONE HCL PF 1 MG/ML IJ SOLN: 0.2500 mg | INTRAMUSCULAR | Status: DC | PRN

## 2013-10-07 MED ORDER — DIPHENHYDRAMINE HCL 12.5 MG/5ML PO ELIX: 12.5000 mg | ORAL_SOLUTION | Freq: Four times a day (QID) | ORAL | Status: DC | PRN

## 2013-10-07 MED ORDER — SODIUM CHLORIDE 0.9 % IV SOLN
INTRAVENOUS | Status: DC
  Administered 2013-10-07: 1.5 [IU]/h via INTRAVENOUS
  Filled 2013-10-07: qty 1

## 2013-10-07 MED ORDER — DEXTROSE 50 % IV SOLN
25.0000 mL | INTRAVENOUS | Status: DC | PRN
  Administered 2013-10-07: 50 mL via INTRAVENOUS

## 2013-10-07 MED ORDER — LACTATED RINGERS IV SOLN
INTRAVENOUS | Status: DC
  Administered 2013-10-07: 13:00:00 via INTRAVENOUS

## 2013-10-07 MED ORDER — PHENOL 1.4 % MT LIQD
1.0000 | OROMUCOSAL | Status: DC | PRN
  Filled 2013-10-07: qty 177

## 2013-10-07 MED ORDER — FAT EMULSION 20 % IV EMUL
250.0000 mL | INTRAVENOUS | Status: DC
Start: 2013-10-07 — End: 2013-10-08
  Filled 2013-10-07: qty 250

## 2013-10-07 MED ORDER — INSULIN REGULAR BOLUS VIA INFUSION
0.0000 [IU] | Freq: Three times a day (TID) | INTRAVENOUS | Status: DC
  Administered 2013-10-09: 1.7 [IU] via INTRAVENOUS
  Filled 2013-10-07: qty 10

## 2013-10-07 MED ORDER — LIDOCAINE HCL (CARDIAC) 20 MG/ML IV SOLN
INTRAVENOUS | Status: DC | PRN
  Administered 2013-10-07: 80 mg via INTRAVENOUS

## 2013-10-07 MED ORDER — ROCURONIUM BROMIDE 100 MG/10ML IV SOLN
INTRAVENOUS | Status: DC | PRN
  Administered 2013-10-07: 5 mg via INTRAVENOUS
  Administered 2013-10-07: 15 mg via INTRAVENOUS

## 2013-10-07 SURGICAL SUPPLY — 45 items
BLADE SURG ROTATE 9660 (MISCELLANEOUS) IMPLANT
CANISTER SUCTION 2500CC (MISCELLANEOUS) ×3 IMPLANT
CHLORAPREP W/TINT 26ML (MISCELLANEOUS) ×3 IMPLANT
COVER MAYO STAND STRL (DRAPES) IMPLANT
COVER SURGICAL LIGHT HANDLE (MISCELLANEOUS) ×3 IMPLANT
DRAPE LAPAROSCOPIC ABDOMINAL (DRAPES) ×3 IMPLANT
DRAPE PROXIMA HALF (DRAPES) IMPLANT
DRAPE UTILITY 15X26 W/TAPE STR (DRAPE) ×6 IMPLANT
DRAPE WARM FLUID 44X44 (DRAPE) ×3 IMPLANT
DRSG OPSITE POSTOP 4X10 (GAUZE/BANDAGES/DRESSINGS) IMPLANT
DRSG OPSITE POSTOP 4X8 (GAUZE/BANDAGES/DRESSINGS) IMPLANT
DRSG PAD ABDOMINAL 8X10 ST (GAUZE/BANDAGES/DRESSINGS) ×3 IMPLANT
ELECT BLADE 6.5 EXT (BLADE) ×3 IMPLANT
ELECT CAUTERY BLADE 6.4 (BLADE) ×6 IMPLANT
ELECT REM PT RETURN 9FT ADLT (ELECTROSURGICAL) ×3
ELECTRODE REM PT RTRN 9FT ADLT (ELECTROSURGICAL) ×2 IMPLANT
GLOVE BIO SURGEON STRL SZ7.5 (GLOVE) ×3 IMPLANT
GLOVE BIOGEL PI IND STRL 8 (GLOVE) ×2 IMPLANT
GLOVE BIOGEL PI INDICATOR 8 (GLOVE) ×1
GOWN STRL NON-REIN LRG LVL3 (GOWN DISPOSABLE) ×6 IMPLANT
GOWN STRL REIN XL XLG (GOWN DISPOSABLE) ×3 IMPLANT
KIT BASIN OR (CUSTOM PROCEDURE TRAY) ×3 IMPLANT
KIT ROOM TURNOVER OR (KITS) ×3 IMPLANT
LIGASURE IMPACT 36 18CM CVD LR (INSTRUMENTS) ×3 IMPLANT
NS IRRIG 1000ML POUR BTL (IV SOLUTION) ×6 IMPLANT
PACK GENERAL/GYN (CUSTOM PROCEDURE TRAY) ×3 IMPLANT
PAD ARMBOARD 7.5X6 YLW CONV (MISCELLANEOUS) ×6 IMPLANT
PENCIL BUTTON HOLSTER BLD 10FT (ELECTRODE) IMPLANT
RELOAD PROXIMATE 75MM BLUE (ENDOMECHANICALS) ×3 IMPLANT
SPECIMEN JAR LARGE (MISCELLANEOUS) IMPLANT
SPONGE GAUZE 4X4 12PLY STER LF (GAUZE/BANDAGES/DRESSINGS) ×3 IMPLANT
SPONGE LAP 18X18 X RAY DECT (DISPOSABLE) IMPLANT
STAPLER PROXIMATE 75MM BLUE (STAPLE) ×3 IMPLANT
STAPLER VISISTAT 35W (STAPLE) ×3 IMPLANT
SUCTION POOLE TIP (SUCTIONS) ×6 IMPLANT
SUT PDS AB 1 TP1 96 (SUTURE) ×6 IMPLANT
SUT SILK 2 0 SH CR/8 (SUTURE) ×3 IMPLANT
SUT SILK 2 0 TIES 10X30 (SUTURE) ×3 IMPLANT
SUT SILK 3 0 SH CR/8 (SUTURE) ×3 IMPLANT
SUT SILK 3 0 TIES 10X30 (SUTURE) ×3 IMPLANT
TAPE CLOTH SURG 6X10 WHT LF (GAUZE/BANDAGES/DRESSINGS) ×3 IMPLANT
TOWEL OR 17X26 10 PK STRL BLUE (TOWEL DISPOSABLE) ×3 IMPLANT
TRAY FOLEY METER SIL LF 16FR (CATHETERS) ×3 IMPLANT
TUBE CONNECTING 12X1/4 (SUCTIONS) IMPLANT
YANKAUER SUCT BULB TIP NO VENT (SUCTIONS) IMPLANT

## 2013-10-07 NOTE — Transfer of Care (Signed)
Immediate Anesthesia Transfer of Care Note  Patient: Taylor Adams  Procedure(s) Performed: Procedure(s): EXPLORATORY LAPAROTOMY (N/A) LYSIS OF ADHESION ILEOCECETOMY  Patient Location: PACU  Anesthesia Type:General  Level of Consciousness: awake, alert , oriented and patient cooperative  Airway & Oxygen Therapy: Patient Spontanous Breathing and Patient connected to nasal cannula oxygen  Post-op Assessment: Report given to PACU RN and Post -op Vital signs reviewed and stable  Post vital signs: Reviewed and stable  Complications: No apparent anesthesia complications

## 2013-10-07 NOTE — Progress Notes (Signed)
Subjective: PT states she feels better.  No gas/BM overnight  Objective: Vital signs in last 24 hours: Temp:  [97.8 F (36.6 C)-101.5 F (38.6 C)] 101.5 F (38.6 C) (01/05 0825) Pulse Rate:  [96-122] 117 (01/05 0558) Resp:  [18-22] 19 (01/05 0558) BP: (120-153)/(69-98) 142/69 mmHg (01/05 0558) SpO2:  [93 %-100 %] 100 % (01/05 0558) Weight:  [115 lb (52.164 kg)] 115 lb (52.164 kg) (01/04 1816) Last BM Date: 10/05/13  Intake/Output from previous day: 01/04 0701 - 01/05 0700 In: 2635 [I.V.:2635] Out: 250 [Emesis/NG output:250] Intake/Output this shift:    General appearance: alert and cooperative Resp: clear to auscultation bilaterally Cardio: regular rate and rhythm, S1, S2 normal, no murmur, click, rub or gallop GI: soft, dist, no rebound/guarding hypoactive BS  Lab Results:   Recent Labs  10/06/13 1106 10/07/13 0633  WBC 6.8 3.1*  HGB 14.9 12.2  HCT 42.5 35.3*  PLT 290 226   BMET  Recent Labs  10/06/13 1106 10/07/13 0633  NA 133* 140  K 5.3 4.4  CL 91* 102  CO2 28 28  GLUCOSE 255* 122*  BUN 23 17  CREATININE 0.81 0.75  CALCIUM 10.1 8.4   PT/INR No results found for this basename: LABPROT, INR,  in the last 72 hours ABG  Recent Labs  10/06/13 1354  HCO3 29.2*    Studies/Results: Ct Abdomen Pelvis W Contrast  10/06/2013   CLINICAL DATA:  Nausea, vomiting and abdominal pain.  EXAM: CT ABDOMEN AND PELVIS WITH CONTRAST  TECHNIQUE: Multidetector CT imaging of the abdomen and pelvis was performed using the standard protocol following bolus administration of intravenous contrast.  CONTRAST:  OMNIPAQUE IOHEXOL 300 MG/ML  SOLN  COMPARISON:  CT of the abdomen and pelvis 06/04/2011.  FINDINGS: Lung Bases: Trace right pleural effusion.  Small hiatal hernia.  Abdomen/Pelvis: Mild periportal edema in the liver. The appearance of the liver is otherwise unremarkable. The appearance of the gallbladder, pancreas, spleen, bilateral adrenal glands and bilateral  kidneys is unremarkable. Small volume of ascites, most pronounced in the low anatomic pelvis. In the low anatomic pelvis there is some apparent peritoneal enhancement, which could indicate some peritonitis. Extensive dilatation of the small bowel which generally measures up to approximately 4.4 cm in diameter. However, shortly before the terminal ileum there is massive dilatation of the distal ileum which measures up to 7.3 cm in diameter. Beyond this there is abrupt transition to completely decompressed terminal ileum and nearly completely decompressed colon. The stomach is also essentially completely decompressed, totally distorted related to mass effect from the adjacent dilated loops of small bowel. No pneumoperitoneum.  Musculoskeletal: There are no aggressive appearing lytic or blastic lesions noted in the visualized portions of the skeleton.  IMPRESSION: 1. Findings, as above, compatible with small bowel obstruction. There is an apparent transition point in the region of the distal/terminal ileum, as discussed above. Surgical consultation is recommended. 2. Small volume of ascites, most pronounced in the pelvis where there is some peritoneal enhancement. This may indicate peritonitis. The possibility of perforation is difficult to entirely exclude, but is not strongly favored at this time. No frank pneumoperitoneum is noted. 3. Trace right pleural effusion. 4. Small hiatal hernia.   Electronically Signed   By: Trudie Reed M.D.   On: 10/06/2013 14:54   Dg Abd 2 Views  10/07/2013   CLINICAL DATA:  Small bowel obstruction.  Abdominal pain.  EXAM: ABDOMEN - 2 VIEW  COMPARISON:  10/06/2013  FINDINGS: Nasogastric tube is coiled  within the proximal stomach. Multiple abnormally dilated loops of small bowel are again identified. There are numerous air-fluid levels within both sides of the abdomen.  IMPRESSION: 1. Persistent high-grade small bowel obstruction.   Electronically Signed   By: Signa Kellaylor  Stroud M.D.    On: 10/07/2013 08:28   Dg Abd Acute W/chest  10/06/2013   CLINICAL DATA:  Abdominal pain with distention.  EXAM: ACUTE ABDOMEN SERIES (ABDOMEN 2 VIEW & CHEST 1 VIEW)  COMPARISON:  CT 06/14/2011  FINDINGS: Chest radiograph demonstrates clear lungs. Normal appearance of the heart and mediastinum. The trachea is midline. There are multiple air-fluid levels in the small bowel with small bowel distention. Lucency underneath the left hemidiaphragm could be related to the stomach. Small bowel loops measure up to 4.1 cm on the supine images. 1.2 cm oval-shaped radiopaque structure in the right hemipelvis of uncertain etiology. This may represent an ingested structure. No significant gas within the colon.  IMPRESSION: Small bowel dilatation with multiple air-fluid levels. Findings are suggestive for a high-grade small-bowel obstruction. Lucency underneath the left hemidiaphragm may be related to the stomach but difficult to completely exclude free air. Recommend further evaluation with CT.  No acute chest findings.  These results were called by telephone at the time of interpretation on 10/06/2013 at 1:08 PM to Dr. Gwyneth SproutWHITNEY PLUNKETT , who verbally acknowledged these results.   Electronically Signed   By: Richarda OverlieAdam  Henn M.D.   On: 10/06/2013 13:09    Anti-infectives: Anti-infectives   Start     Dose/Rate Route Frequency Ordered Stop   10/07/13 1600  cefTRIAXone (ROCEPHIN) 1 g in dextrose 5 % 50 mL IVPB     1 g 100 mL/hr over 30 Minutes Intravenous Every 24 hours 10/06/13 1816 10/10/13 1559   10/07/13 0000  cefTRIAXone (ROCEPHIN) 1 g in dextrose 5 % 50 mL IVPB  Status:  Discontinued     1 g 100 mL/hr over 30 Minutes Intravenous Every 24 hours 10/06/13 1816 10/06/13 1835   10/06/13 1515  cefTRIAXone (ROCEPHIN) 1 g in dextrose 5 % 50 mL IVPB     1 g 100 mL/hr over 30 Minutes Intravenous  Once 10/06/13 1509 10/06/13 1705      Assessment/Plan: SBO To OR for ex lap I discussed with teh patient and her husband the  risks and benefits of the operation to include but not limited to: infection, bleeding, possible ostomy, possible leak, and possible need for further surgery.  She voiced understanding and wishes to proceed.   LOS: 1 day    Marigene EhlersRamirez Jr., Jed LimerickArmando 10/07/2013

## 2013-10-07 NOTE — Consult Note (Signed)
PCP:   Gaspar Garbe, MD   Chief Complaint:  SBO, Consult requested by Dr. Donell Beers, [facility].  HPI: Taylor Adams is a long term patient of mine with controlled Type 1 Diabetes Mellitus on an insulin pump.  She has had issues with gastroparesis in the past and had been given at one time Reglan, which she tolerated extremely poorly.  She went to the ER last Wednesday, but was unable to drink contrast and for some reason was sent home without imaging per her recollection.   She contacted our office on Friday as well as Dr. Regino Schultze.  Juanda Chance was unable to see her Friday but arranged OV for today.  Over the weekend her GI distress worsened and she started to develop pain.  She came to the ER and had imaging done which showed an SBO and was subsequently admitted by General Surgery with an NG placed and made NPO.  She has yet to have her abdominal x rays for today.  I was asked to consult for her insulin pump management.  Orders for pump protocol were placed yesterday remotely as CCS indicated that I did not need to see her at admission.  I am consulting on her today for continued care.  She is not in any distress but received 4mg  IV Morphine moments before she was seen.  She has an NG in place with bilious drainage.  Her husband is in the room and they have multiple questions about meds, nutrition, etc, which I spent a considerable time addressing this AM.  Her CBG at 4:30 AM was 127.  She remains on her pump and is conscious and able to dose correctly for boluses per her routine.  She has had no oral intake of calorie at this time.  Review of Systems:  Review of Systems - Negative except abdominal pain and cramping and nausea, and general weakness.  Otherwise negative on a 12 point review. Past Medical History: Past Medical History  Diagnosis Date  . Gastroparesis   . Intestinal bacterial overgrowth   . Diabetes mellitus  Type 1 on insulin pump   . Benign colon polyp   . Diarrhea    HTN      Depression/anxiety  Past Surgical History  Procedure Laterality Date  . Abdominal hysterectomy    . Breast surgery    . Appendectomy    . Resection of lymph node in neck      Medications: Prior to Admission medications   Medication Sig Start Date End Date Taking? Authorizing Provider  CALCIUM PO Take 1 tablet by mouth daily.   Yes Historical Provider, MD  CHROMIUM PO Take 1 tablet by mouth daily.   Yes Historical Provider, MD  DIOVAN 80 MG tablet Take 80 mg by mouth daily.  04/13/11  Yes Historical Provider, MD  Insulin Human (INSULIN PUMP) 100 unit/ml SOLN Inject into the skin continuous.   Yes Historical Provider, MD  MAGNESIUM PO Take 1 tablet by mouth daily.   Yes Historical Provider, MD  Probiotic Product (PROBIOTIC PO) Take 1 tablet by mouth daily.   Yes Historical Provider, MD  traMADol (ULTRAM) 50 MG tablet Take 1 tablet (50 mg total) by mouth every 6 (six) hours as needed. 10/03/13  Yes Vida Roller, MD    Allergies:  No Known Allergies  Social History:  reports that she has never smoked. She has never used smokeless tobacco. She reports that she drinks alcohol. She reports that she does not use illicit drugs.  Family History: Family History  Problem Relation Age of Onset  . Colon cancer Neg Hx   . Colon polyps Father   . Heart disease Father   . Heart disease Brother   . Diabetes Mother   . Diabetes Father     Physical Exam: Filed Vitals:   10/06/13 1816 10/06/13 2332 10/07/13 0211 10/07/13 0558  BP: 135/84 141/86 142/69 142/69  Pulse: 100 99 112 117  Temp: 98.1 F (36.7 C) 98.4 F (36.9 C) 98.1 F (36.7 C) 100.2 F (37.9 C)  TempSrc: Oral Oral    Resp: 18 19 18 19   Height: 5\' 2"  (1.575 m)     Weight: 52.164 kg (115 lb)     SpO2: 94% 93% 99% 100%   General appearance: alert, cooperative and appears stated age Head: Normocephalic, without obvious abnormality, atraumatic Eyes: conjunctivae/corneas clear. PERRL, EOM's intact.  Nose: Nares  normal. Septum midline. Mucosa normal. No drainage or sinus tenderness. Throat: lips, mucosa, and tongue normal; teeth and gums normal Neck: no adenopathy, no carotid bruit, no JVD and thyroid not enlarged, symmetric, no tenderness/mass/nodules Resp: clear to auscultation bilaterally Cardio: regular rate and rhythm, S1, S2 normal, no murmur, click, rub or gallop GI: bloated, mild diffuse tenderness without rebound. Extremities: extremities normal, atraumatic, no cyanosis or edema Pulses: 2+ and symmetric Lymph nodes: Cervical adenopathy: no cervical lymphadenopathy Neurologic: Alert and oriented X 3, normal strength and tone. Normal symmetric reflexes.     Labs on Admission:   Recent Labs  10/06/13 1106  NA 133*  K 5.3  CL 91*  CO2 28  GLUCOSE 255*  BUN 23  CREATININE 0.81  CALCIUM 10.1    Recent Labs  10/06/13 1106  AST 27  ALT 17  ALKPHOS 125*  BILITOT 0.6  PROT 8.2  ALBUMIN 3.3*    Recent Labs  10/06/13 1106  LIPASE 10*    Recent Labs  10/06/13 1106 10/07/13 0633  WBC 6.8 3.1*  NEUTROABS 5.2  --   HGB 14.9 12.2  HCT 42.5 35.3*  MCV 85.0 83.6  PLT 290 226    Radiological Exams on Admission: Ct Abdomen Pelvis W Contrast  10/06/2013   CLINICAL DATA:  Nausea, vomiting and abdominal pain.  EXAM: CT ABDOMEN AND PELVIS WITH CONTRAST  TECHNIQUE: Multidetector CT imaging of the abdomen and pelvis was performed using the standard protocol following bolus administration of intravenous contrast.  CONTRAST:  OMNIPAQUE IOHEXOL 300 MG/ML  SOLN  COMPARISON:  CT of the abdomen and pelvis 06/04/2011.  FINDINGS: Lung Bases: Trace right pleural effusion.  Small hiatal hernia.  Abdomen/Pelvis: Mild periportal edema in the liver. The appearance of the liver is otherwise unremarkable. The appearance of the gallbladder, pancreas, spleen, bilateral adrenal glands and bilateral kidneys is unremarkable. Small volume of ascites, most pronounced in the low anatomic pelvis. In  the low anatomic pelvis there is some apparent peritoneal enhancement, which could indicate some peritonitis. Extensive dilatation of the small bowel which generally measures up to approximately 4.4 cm in diameter. However, shortly before the terminal ileum there is massive dilatation of the distal ileum which measures up to 7.3 cm in diameter. Beyond this there is abrupt transition to completely decompressed terminal ileum and nearly completely decompressed colon. The stomach is also essentially completely decompressed, totally distorted related to mass effect from the adjacent dilated loops of small bowel. No pneumoperitoneum.  Musculoskeletal: There are no aggressive appearing lytic or blastic lesions noted in the visualized portions of the skeleton.  IMPRESSION: 1. Findings, as above, compatible with small bowel obstruction. There is an apparent transition point in the region of the distal/terminal ileum, as discussed above. Surgical consultation is recommended. 2. Small volume of ascites, most pronounced in the pelvis where there is some peritoneal enhancement. This may indicate peritonitis. The possibility of perforation is difficult to entirely exclude, but is not strongly favored at this time. No frank pneumoperitoneum is noted. 3. Trace right pleural effusion. 4. Small hiatal hernia.   Electronically Signed   By: Trudie Reedaniel  Entrikin M.D.   On: 10/06/2013 14:54   Dg Abd Acute W/chest  10/06/2013   CLINICAL DATA:  Abdominal pain with distention.  EXAM: ACUTE ABDOMEN SERIES (ABDOMEN 2 VIEW & CHEST 1 VIEW)  COMPARISON:  CT 06/14/2011  FINDINGS: Chest radiograph demonstrates clear lungs. Normal appearance of the heart and mediastinum. The trachea is midline. There are multiple air-fluid levels in the small bowel with small bowel distention. Lucency underneath the left hemidiaphragm could be related to the stomach. Small bowel loops measure up to 4.1 cm on the supine images. 1.2 cm oval-shaped radiopaque structure  in the right hemipelvis of uncertain etiology. This may represent an ingested structure. No significant gas within the colon.  IMPRESSION: Small bowel dilatation with multiple air-fluid levels. Findings are suggestive for a high-grade small-bowel obstruction. Lucency underneath the left hemidiaphragm may be related to the stomach but difficult to completely exclude free air. Recommend further evaluation with CT.  No acute chest findings.  These results were called by telephone at the time of interpretation on 10/06/2013 at 1:08 PM to Dr. Gwyneth SproutWHITNEY PLUNKETT , who verbally acknowledged these results.   Electronically Signed   By: Richarda OverlieAdam  Henn M.D.   On: 10/06/2013 13:09    Assessment/Plan Principal Problem:   SBO (small bowel obstruction) Has a temp this AM.  Have asked the nurse to let Dr. Donell BeersByerly know and review x ray that she is currently going down for.  NPO and continued NG suction.  Hopefully this is not a sign of perforation.  Fever:  See above, may need abx per CCS discretion.  X ray needs to be reviewed for perforation as well.  She shows no respiratory signs of influenza, [place].  DM Type 1, Continue pump on current settings, if goes lower, will reduce her basal by 20-30%.  She has SSI but not needing at present and not getting extra for meal coverage.  If she requires surgery today, D/C her pump and place on Glucommander protocol until she is alert and awake enough to use her pump.  I will help facilitate this.    Deprtession/Anxiety:  If more anxious, give IV benzodiazepine, but orals on hold  Pain control:  Morphine per CCS  Ordered Incentive spirometer due to her fever and in bed status.  HTN: Diovan held, BP controlled.  With fever, would not resume at this point.  Nutrition:  If not resolving may need TPN, which will make her sugars go quite high.  Will need Glucommander for that until pump can be adjusted (glucommander insulin response by IV is faster  than subq given by pump regardless of insulin type.  I will be in the office today [phone], call if issues or questions.  My partners will be covering call after hours at same contact number. Marquell Saenz W 10/07/2013, 8:02 AM

## 2013-10-07 NOTE — Progress Notes (Signed)
INITIAL NUTRITION ASSESSMENT  DOCUMENTATION CODES Per approved criteria  -Not Applicable   INTERVENTION: TPN per Pharmacy Diet advancement per MD RD to continue to monitor  NUTRITION DIAGNOSIS: Inadequate oral intake related to SBO as evidenced by NPO status.   Goal: Pt to meet >/= 90% of their estimated nutrition needs   Monitor:  TPN rate Weight trends Labs Bowel function Diet advancement  Reason for Assessment: Consult  63 y.o. female  Admitting Dx: SBO (small bowel obstruction)  ASSESSMENT: 63 yo female who has a h/o type I DM with gastroparesis. This past Wednesday she ate steak and high fiber supper. She then developed crampy abdominal pain. She has continued to have some nausea and emesis. She has not passed flatus or a BM since Wednesday. She comes in today with a CT scan that reveals a high grade SBO.  RD consulted due to new TPN/TNA. Pt out of room for surgery at time of visit. Per [facility] report pt denied decreased appetite and unintentional weight loss PTA. Weight history shows pt's weight has been stable. Per pharmacy note pt had nausea and vomiting for 5 days PTA.   Height: Ht Readings from Last 1 Encounters:  10/06/13 5\' 2"  (1.575 m)    Weight: Wt Readings from Last 1 Encounters:  10/06/13 115 lb (52.164 kg)    Ideal Body Weight: 110 lbs  % Ideal Body Weight: 105%  Wt Readings from Last 10 Encounters:  10/06/13 115 lb (52.164 kg)  10/06/13 115 lb (52.164 kg)  10/03/13 115 lb 4 oz (52.277 kg)  06/10/11 111 lb (50.349 kg)  09/04/09 108 lb (48.988 kg)  06/02/08 115 lb 6.1 oz (52.337 kg)    Usual Body Weight: 115 lbs  % Usual Body Weight: 100%  BMI:  Body mass index is 21.03 kg/(m^2).  Estimated Nutritional Needs: Kcal: 1400-1610 Protein: 62-72 grams Fluid: 1.6 L/day  Skin: abdominal incision  Diet Order: NPO  EDUCATION NEEDS: -No education needs identified at this time   Intake/Output Summary (Last 24 hours) at 10/07/13 1624 Last  data filed at 10/07/13 1504  Gross per 24 hour  Intake 3332.92 ml  Output    875 ml  Net 2457.92 ml    Last BM: 1/3   Labs:   Recent Labs Lab 10/03/13 0159 10/06/13 1106 10/07/13 0633 10/07/13 1440 10/07/13 1445  NA 137 133* 140 142  --   K 4.1 5.3 4.4 3.3*  --   CL 92* 91* 102  --   --   CO2 25 28 28   --   --   BUN 17 23 17   --   --   CREATININE 0.65 0.81 0.75  --   --   CALCIUM 10.1 10.1 8.4  --   --   GLUCOSE 309* 255* 122*  --  109*    CBG (last 3)   Recent Labs  10/07/13 0805 10/07/13 1250 10/07/13 1505  GLUCAP 99 129* 78    Scheduled Meds: . [MAR HOLD] cefTRIAXone (ROCEPHIN)  IV  1 g Intravenous Q24H  . dextrose      . [MAR HOLD] enoxaparin (LOVENOX) injection  40 mg Subcutaneous Q24H  . [MAR HOLD] influenza vac split quadrivalent PF  0.5 mL Intramuscular Tomorrow-1000  . [MAR HOLD] insulin pump   Subcutaneous Q4H  . [MAR HOLD] pantoprazole (PROTONIX) IV  40 mg Intravenous QHS  . [facility]] pneumococcal 23 valent vaccine  0.5 mL Intramuscular Tomorrow-1000    Continuous Infusions: . [MAR HOLD] .TPN (CLINIMIX-E)  Adult     And  . [MAR HOLD] fat emulsion    . insulin (NOVOLIN-R) infusion 1.5 Units/hr (10/07/13 1447)  . lactated ringers 50 mL/hr at 10/07/13 1303  . sodium chloride 0.9 % 1,000 mL infusion 125 mL/hr at 10/07/13 0825    Past Medical History  Diagnosis Date  . Gastroparesis   . Intestinal bacterial overgrowth   . Diabetes mellitus   . Benign colon polyp   . Diarrhea     Past Surgical History  Procedure Laterality Date  . Abdominal hysterectomy    . Breast surgery    . Appendectomy    . Resection of lymph node in neck      Ian Malkin RD, LDN Inpatient Clinical Dietitian Pager: [phone] After Hours Pager: [phone]

## 2013-10-07 NOTE — Anesthesia Postprocedure Evaluation (Signed)
Anesthesia Post Note  Patient: Taylor BarretteWanda Adams [facility]  Procedure(s) Performed: Procedure(s) (LRB): EXPLORATORY LAPAROTOMY (N/A) LYSIS OF ADHESION ILEOCECETOMY  Anesthesia type: General  Patient location: PACU  Post pain: Pain level controlled and Adequate analgesia  Post assessment: Post-op Vital signs reviewed, Patient's Cardiovascular Status Stable, Respiratory Function Stable, Patent Airway and Pain level controlled  Last Vitals:  Filed Vitals:   10/07/13 1532  BP: 145/91  Pulse: 125  Temp:   Resp: 17    Post vital signs: Reviewed and stable  Level of consciousness: awake, alert  and oriented  Complications: No apparent anesthesia complications

## 2013-10-07 NOTE — Progress Notes (Addendum)
Inpatient Diabetes Program Recommendations  AACE/ADA: New Consensus Statement on Inpatient Glycemic Control (2013)  Target Ranges:  Prepandial:   less than 140 mg/dL      Peak postprandial:   less than 180 mg/dL (1-2 hours)      Critically ill patients:  140 - 180 mg/dL     Results for [name], Jmya W (MRN [id number]) as of 10/07/2013 09:48  Ref. Range 10/07/2013 00:08 10/07/2013 04:38 10/07/2013 08:05  Glucose-Capillary Latest Range: 70-99 mg/dL 914172 (H) 782127 (H) 99    **Patient to go to OR today after lunch for surgery.  Noted Dr. Wylene Simmerisovec has placed "Signed and Held" orders for patient to be placed on IV insulin drip per GlucoStabilizer order set for surgery today.  Patient will need to remove her insulin pump as she is started on an IV insulin drip.  Soyla Murphyalled Carol, RN on 6N to alert her to these new orders.  Asked Okey RegalCarol to please remind RN in pre-op area to make sure IV insulin drip is started for patient and that patient's insulin pump is removed before surgery.  Daisy LazarGave Carol, RN DM Coordinator main pager number for any further questions.  **Patient to resume her insulin pump directly after surgery once she is fully awake and able to resume her insulin pump per Dr. Wylene Simmerisovec.   Will follow. Ambrose FinlandJeannine Johnston Lucetta Baehr RN, MSN, CDE Diabetes Coordinator Inpatient Diabetes Program Team Pager: [phone] (8a-10p)

## 2013-10-07 NOTE — Op Note (Signed)
10/07/2013  2:56 PM  PATIENT:  Taylor Adams  63 y.o. female  PRE-OPERATIVE DIAGNOSIS:  Small Bowel Obstruction  POST-OPERATIVE DIAGNOSIS:  Small Bowel Obstruction from volvlus caused by adhesion  PROCEDURE:  Procedure(s): EXPLORATORY LAPAROTOMY (N/A) LYSIS OF ADHESION ILEOCECETOMY  SURGEON:  Surgeon(s) and Role:    * Axel Filler, MD - Primary  PHYSICIAN ASSISTANT: Barnetta Chapel, PA-C.  ASSISTANTS: none   ANESTHESIA:   general  EBL:  Total I/O In: 250 [IV Piggyback:250] Out: 150 [Urine:150]  BLOOD ADMINISTERED:none  DRAINS: none   LOCAL MEDICATIONS USED:  NONE  SPECIMEN:  Source of Specimen:  Ileal cectomy  DISPOSITION OF SPECIMEN:  PATHOLOGY  COUNTS:  YES  TOURNIQUET:  * No tourniquets in log *  DICTATION: .Dragon Dictation The patient was taken back to the operating room after being consented. The patient was placed in the supine position bilateral SCDs in place. After appropriate antibiotics were confirmed, a timeout was called and all facts were reverified.  A midline incision was made with a 10 blade. Bovie cautery was used to maintain hemostasis dissection was taken down to the anterior fascia. The peritoneum was entered bluntly. An incision was extended to the length of the skin incision. There was a large amount of distended bowel that could be seen that was covered by omentum. The omentum was stuck down to the right lower quadrant. I could feel that there was a twist in the mesentery of the small bowel. I ligated the omentum that was stuck down to the right lower quadrant with 2 Kelly's and tied off each end with 0 silk. This freed up the mesentery to be  Dissected away from the lateral abdominal wall. There was a large portion of the small bowel that was localized within the pelvis. I move to free this up and deliver this into the wound. At this time it could be seen that it was the terminal ileum that was ischemic necrotic portions however there was no frank  perforation. At this time the dissected the lateral attachments of the right colon by incising the white line of Toldt cephalad. At this time were able to incise the mesentery and transect the cecum with a 75 GIA stapler. The volvulized portion of small bowel was untwisted and a portion of viable proximal bowel was chosen as a transection point. We incised the mesentery and stapled to the proximal portion with the 75 GIA stapler. We then ligated the mesentery using the LigaSure device. The ileocecal artery was ligated with an 0 silk tie.This allowed [place] to and the specimen off and was sent to pathology.  I then ran the small bowel from the ligament of Treitz distally. I examined the ascending colon transverse colon descending colon and sigmoid colon and rectum. There was some reactive surrounding edema to the sigmoid colon and rectum however there was no active involvement.  At this time I may plan to enterotomy at the proximal portion of the staple line. I inserted a suction device end of the small bowel contents distally. At this time I removed the port of the staple line of the large intestine. A 75 GIA stapler was then entered into each portion of the bowel to create anastomosis. At this time the common enterotomy site was reapproximated and closed with a 75 GIA stapler. An apex stitch was placed using a of 2-0 silk stitch.  The abdomen was irrigated out with copious amounts of saline.  A portion of the transverse colon was seen  to be hemorrhagic however was not compromised. A portion of the omentum was then sutured down to this portion of the transverse colon.  The midline was then reapproximated using a double-stranded PDS in running standard fashion. The subcutaneous  Midline was irrigated with sterile saline and the skin was loosely reapproximated.  The patient tolerated the procedure well and was taken to recovery in guarded condition.  PLAN OF CARE: admitted as an inpatient  PATIENT  DISPOSITION:  PACU - guarded condition.   Delay start of Pharmacological VTE agent (>24hrs) due to surgical blood loss or risk of bleeding: no

## 2013-10-07 NOTE — Progress Notes (Signed)
Patient's insulin pump given to her dtr in short stay waiting area.

## 2013-10-07 NOTE — Anesthesia Preprocedure Evaluation (Signed)
Anesthesia Evaluation  Patient identified by MRN, date of birth, ID band Patient awake    Reviewed: Allergy & Precautions, H&P , NPO status , Patient's Chart, lab work & pertinent test results  History of Anesthesia Complications Negative for: history of anesthetic complications  Airway Mallampati: II  Neck ROM: Full    Dental  (+) Teeth Intact   Pulmonary neg pulmonary ROS,  breath sounds clear to auscultation        Cardiovascular Rhythm:Regular Rate:Tachycardia     Neuro/Psych    GI/Hepatic Gastroparesis   Endo/Other  diabetes  Renal/GU      Musculoskeletal negative musculoskeletal ROS (+)   Abdominal   Peds  Hematology negative hematology ROS (+)   Anesthesia Other Findings   Reproductive/Obstetrics                           Anesthesia Physical Anesthesia Plan  ASA: II and emergent  Anesthesia Plan: General   Post-op Pain Management:    Induction: Intravenous  Airway Management Planned: Oral ETT  Additional Equipment:   Intra-op Plan:   Post-operative Plan: Extubation in OR  Informed Consent: I have reviewed the patients History and Physical, chart, labs and discussed the procedure including the risks, benefits and alternatives for the proposed anesthesia with the patient or authorized representative who has indicated his/her understanding and acceptance.   Dental advisory given  Plan Discussed with: CRNA and Surgeon  Anesthesia Plan Comments:         Anesthesia Quick Evaluation

## 2013-10-07 NOTE — Anesthesia Procedure Notes (Signed)
Procedure Name: Intubation Date/Time: 10/07/2013 1:17 PM Performed by: Sharlene DoryWALKER, Nicle Connole E Pre-anesthesia Checklist: Patient identified, Emergency Drugs available, Suction available, Patient being monitored and Timeout performed Patient Re-evaluated:Patient Re-evaluated prior to inductionOxygen Delivery Method: Circle system utilized Preoxygenation: Pre-oxygenation with 100% oxygen Intubation Type: IV induction and Rapid sequence Laryngoscope Size: Mac and 3 Grade View: Grade I Tube type: Oral Tube size: 7.0 mm Number of attempts: 1 Airway Equipment and Method: Stylet Placement Confirmation: ETT inserted through vocal cords under direct vision,  positive ETCO2 and breath sounds checked- equal and bilateral Secured at: 22 cm Tube secured with: Tape Dental Injury: Teeth and Oropharynx as per pre-operative assessment

## 2013-10-07 NOTE — Progress Notes (Addendum)
PARENTERAL NUTRITION CONSULT NOTE - INITIAL  Pharmacy Consult for TPN Indication: SBO  No Known Allergies  Patient Measurements: Height: 5\' 2"  (157.5 cm) Weight: 115 lb (52.164 kg) IBW/kg (Calculated) : 50.1 Adjusted Body Weight:   Usual Weight: 52.2 kg  Vital Signs: Temp: 101.5 F (38.6 C) (01/05 0825) Temp src: Oral (01/05 0825) BP: 142/69 mmHg (01/05 0558) Pulse Rate: 117 (01/05 0558) Intake/Output from previous day: 01/04 0701 - 01/05 0700 In: 2635 [I.V.:2635] Out: 250 [Emesis/NG output:250] Intake/Output from this shift:    Labs:  Recent Labs  10/06/13 1106 10/07/13 0633  WBC 6.8 3.1*  HGB 14.9 12.2  HCT 42.5 35.3*  PLT 290 226     Recent Labs  10/06/13 1106 10/07/13 0633  NA 133* 140  K 5.3 4.4  CL 91* 102  CO2 28 28  GLUCOSE 255* 122*  BUN 23 17  CREATININE 0.81 0.75  CALCIUM 10.1 8.4  PROT 8.2  --   ALBUMIN 3.3*  --   AST 27  --   ALT 17  --   ALKPHOS 125*  --   BILITOT 0.6  --    Estimated Creatinine Clearance: 58.4 ml/min (by C-G formula based on Cr of 0.75).    Recent Labs  10/07/13 0008 10/07/13 0438 10/07/13 0805  GLUCAP 172* 127* 99    Medical History: Past Medical History  Diagnosis Date  . Gastroparesis   . Intestinal bacterial overgrowth   . Diabetes mellitus   . Benign colon polyp   . Diarrhea     Medications:  Prescriptions prior to admission  Medication Sig Dispense Refill  . CALCIUM PO Take 1 tablet by mouth daily.      . CHROMIUM PO Take 1 tablet by mouth daily.      Marland Kitchen. DIOVAN 80 MG tablet Take 80 mg by mouth daily.       . Insulin Human (INSULIN PUMP) 100 unit/ml SOLN Inject into the skin continuous.      Marland Kitchen. MAGNESIUM PO Take 1 tablet by mouth daily.      . Probiotic Product (PROBIOTIC PO) Take 1 tablet by mouth daily.      . traMADol (ULTRAM) 50 MG tablet Take 1 tablet (50 mg total) by mouth every 6 (six) hours as needed.  15 tablet  0    Insulin Requirements in the past 24 hours:  Insulin  pump  Current Nutrition:  NPO  Assessment: [name] admitted with severe abdominal pain, N/V x 5d after eating meat for the first time in 3 years. 1/4 CT consistent with small bowel obstruction with transition point in the terminal ileum.  GI: h/o diabetic gastroparesis. Now SBO with no significant po intake. Evaluate for perforated due to fever this AM. NPO + NG tube. IV PPI. Baseline albumin 4.6 on 10/03/13.  Endo: Type 1 DM on insulin pump managed by patient. See note and recommendation by Dr. Wylene Simmerisovec [phone]). Would like her placed on the Glucommander around her surgery until she is alert/awake enough to manage her pump again.  Lytes: WNL  Renal: Scr 0.75  Pulm: 100%  Cards: HTN. 142/69, HR 96-122  Hepatobil: WNL  Neuro: Depression/anxiety  ID:   UTI on Rocephin x 3d. Tmax 101.5 this AM. WBC 3.1.  Best Practices: Lovenox 40mg /d, IV PPI  TPN Access: f/u PICC placement 1/5  TPN day#:0   Nutritional Goals:  ______ kCal, _____grams of protein per day A rate of 3265ml/hr and lipids at 417ml/hr would give 1444kcal and  78g protein daily.  Plan:  1. Plan to OR for exlap today.  2. F/u after RD assessment of nutritional needs.  3. Start TPN this evening at 67ml/hr with lipids at 71ml/hr  4. Will need to allow Dr. Wylene Simmer and diabetes coordinators to manage Glucommander around surgery and assess additional insulin needs from there.   Taylor Adams S. Merilynn Finland, PharmD, BCPS Clinical Staff Pharmacist Pager [phone]  Taylor Adams 10/07/2013,9:08 AM

## 2013-10-07 NOTE — Preoperative (Signed)
Beta Blockers   Reason not to administer Beta Blockers:Not Applicable 

## 2013-10-07 NOTE — Progress Notes (Signed)
Inpatient Diabetes Program Recommendations  AACE/ADA: New Consensus Statement on Inpatient Glycemic Control (2013)  Target Ranges:  Prepandial:   less than 140 mg/dL      Peak postprandial:   less than 180 mg/dL (1-2 hours)      Critically ill patients:  140 - 180 mg/dL     **Called RN in pre-op area to discuss Dr. Deneen Hartsisovec's IV insulin per GlucoStabilizer orders for Taylor Adams ([facility]  RN in pre-op told me that the anesthesiologist is currently evaluating patient and deciding what to do for her CBG control during the perioperative period.  Asked RN to please alert the anesthesiologist to Dr. Deneen Hartsisovec's orders and his progress note from this morning.  Dr. Wylene Simmerisovec stated in his progress note that he would like patient off her insulin pump and managed with IV insulin drip during and immediately after surgery.  RN in pre-op told me patient currently has her insulin pump on her person, however, the insulin pump is in suspend at present.  Reminded pre-op RN that patient must be on IV insulin drip for surgery since she has Type 1 DM and has no other source of insulin on board at present.  RN to address with anesthesiologist.  Asked RN to please make sure that patient's insulin pump is given to her husband once IV insulin drip started.    Will follow. Ambrose FinlandJeannine Johnston Charidy Cappelletti RN, MSN, CDE Diabetes Coordinator Inpatient Diabetes Program Team Pager: [phone] (8a-10p)

## 2013-10-07 NOTE — Progress Notes (Signed)
Arrived to insert PICC.   Pt just back from surgery a few minutes ago.   Requesting that we wait and do PICC in the morning.   They are getting her insulin pump reprogrammed and set up.   Primary RN, Okey Regalarol in room and aware.

## 2013-10-07 NOTE — Progress Notes (Signed)
Dr. Jacklynn BueMassagee into see pt., Pump d/c'd, site d/c'd per Dr. Jacklynn BueMassagee.  Insulin drip being prepared by pharmacy

## 2013-10-07 NOTE — Progress Notes (Signed)
cbg = 78 Dr. Jacklynn Buemassagee notified, insuling drip stopped

## 2013-10-08 DIAGNOSIS — E876 Hypokalemia: Secondary | ICD-10-CM

## 2013-10-08 LAB — COMPREHENSIVE METABOLIC PANEL
ALT: 14 U/L (ref 0–35)
AST: 35 U/L (ref 0–37)
Albumin: 2.2 g/dL — ABNORMAL LOW (ref 3.5–5.2)
Alkaline Phosphatase: 67 U/L (ref 39–117)
BUN: 11 mg/dL (ref 6–23)
CALCIUM: 7.5 mg/dL — AB (ref 8.4–10.5)
CO2: 29 mEq/L (ref 19–32)
Chloride: 103 mEq/L (ref 96–112)
Creatinine, Ser: 0.65 mg/dL (ref 0.50–1.10)
GFR calc Af Amer: >90 mL/min (ref >90)
GFR calc non Af Amer: >90 mL/min (ref >90)
Glucose, Bld: 123 mg/dL — ABNORMAL HIGH (ref 70–99)
Potassium: 3.3 mEq/L — ABNORMAL LOW (ref 3.7–5.3)
Sodium: 142 mEq/L (ref 137–147)
TOTAL PROTEIN: 5.3 g/dL — AB (ref 6.0–8.3)
Total Bilirubin: 0.3 mg/dL (ref 0.3–1.2)

## 2013-10-08 LAB — CBC
HEMATOCRIT: 36 % (ref 36.0–46.0)
HEMOGLOBIN: 11.9 g/dL — AB (ref 12.0–15.0)
MCH: 28.3 pg (ref 26.0–34.0)
MCHC: 33.1 g/dL (ref 30.0–36.0)
MCV: 85.5 fL (ref 78.0–100.0)
Platelets: 199 10*3/uL (ref 150–400)
RBC: 4.21 MIL/uL (ref 3.87–5.11)
RDW: 14.7 % (ref 11.5–15.5)
WBC: 9.5 10*3/uL (ref 4.0–10.5)

## 2013-10-08 LAB — URINE CULTURE: Colony Count: >=100000

## 2013-10-08 LAB — GLUCOSE, CAPILLARY
GLUCOSE-CAPILLARY: 163 mg/dL — AB (ref 70–99)
Glucose-Capillary: 150 mg/dL — ABNORMAL HIGH (ref 70–99)
Glucose-Capillary: 86 mg/dL (ref 70–99)
Glucose-Capillary: 119 mg/dL — ABNORMAL HIGH (ref 70–99)
Glucose-Capillary: 182 mg/dL — ABNORMAL HIGH (ref 70–99)

## 2013-10-08 LAB — TRIGLYCERIDES: TRIGLYCERIDES: 144 mg/dL (ref <150)

## 2013-10-08 LAB — PHOSPHORUS: PHOSPHORUS: 2.1 mg/dL — AB (ref 2.3–4.6)

## 2013-10-08 LAB — MAGNESIUM: Magnesium: 1.9 mg/dL (ref 1.5–2.5)

## 2013-10-08 LAB — PREALBUMIN: Prealbumin: 3.7 mg/dL — ABNORMAL LOW (ref 17.0–34.0)

## 2013-10-08 MED ORDER — TRACE MINERALS CR-CU-F-FE-I-MN-MO-SE-ZN IV SOLN
INTRAVENOUS | Status: AC
  Administered 2013-10-08: 18:00:00 via INTRAVENOUS
  Filled 2013-10-08: qty 1000

## 2013-10-08 MED ORDER — SODIUM CHLORIDE 0.9 % IJ SOLN
10.0000 mL | INTRAMUSCULAR | Status: DC | PRN
  Administered 2013-10-10 – 2013-10-12 (×3): 10 mL

## 2013-10-08 MED ORDER — FAT EMULSION 20 % IV EMUL
250.0000 mL | INTRAVENOUS | Status: AC
  Administered 2013-10-08: 250 mL via INTRAVENOUS
  Filled 2013-10-08: qty 250

## 2013-10-08 MED ORDER — MORPHINE SULFATE 2 MG/ML IJ SOLN
1.0000 mg | INTRAMUSCULAR | Status: DC | PRN
  Administered 2013-10-08 – 2013-10-09 (×5): 1 mg via INTRAVENOUS
  Filled 2013-10-08 (×6): qty 1

## 2013-10-08 MED ORDER — POTASSIUM PHOSPHATE DIBASIC 3 MMOLE/ML IV SOLN
15.0000 mmol | Freq: Once | INTRAVENOUS | Status: AC
  Administered 2013-10-08: 15 mmol via INTRAVENOUS
  Filled 2013-10-08: qty 5

## 2013-10-08 MED ORDER — SODIUM CHLORIDE 0.9 % IV SOLN
INTRAVENOUS | Status: DC
  Administered 2013-10-08: 18:00:00 via INTRAVENOUS
  Filled 2013-10-08: qty 1000

## 2013-10-08 MED ORDER — WHITE PETROLATUM GEL
Status: AC
  Administered 2013-10-08: 0.2
  Filled 2013-10-08: qty 5

## 2013-10-08 NOTE — Progress Notes (Signed)
Subjective: Feels a bit better today.  Mostly questions about the logistics of the coming days.  No sugar issues with transition to glucommander for surgery and back to pump.  She is alert, awake, and capable of managing her pump.  Objective: Vital signs in last 24 hours: Temp:  [97 F (36.1 C)-101.5 F (38.6 C)] 97.4 F (36.3 C) (01/06 0503) Pulse Rate:  [37-125] 98 (01/06 0503) Resp:  [12-20] 16 (01/06 0503) BP: (110-154)/(54-104) 110/63 mmHg (01/06 0503) SpO2:  [78 %-100 %] 96 % (01/06 0503) Weight change:  Last BM Date: 10/02/13  Intake/Output from previous day: 01/05 0701 - 01/06 0700 In: 2880.9 [I.V.:2610.9; NG/GT:20; IV Piggyback:250] Out: 1195 [Urine:650; Emesis/NG output:470; Blood:75] Intake/Output this shift:   General appearance: alert, cooperative and appears stated age  Neck: no adenopathy, no carotid bruit, no JVD and thyroid not enlarged, symmetric, no tenderness/mass/nodules  Resp: clear to auscultation bilaterally  Cardio: regular rate and rhythm, S1, S2 normal, no murmur, click, rub or gallop  GI: incision dressed, mild tenderness Extremities: extremities normal, atraumatic, no cyanosis or edema  Pulses: 2+ and symmetric  Lymph nodes: Cervical adenopathy: no cervical lymphadenopathy  Neurologic: Alert and oriented X 3, normal strength and tone. Normal symmetric reflexes.    Lab Results:  Recent Labs  10/07/13 0633 10/07/13 1440 10/08/13 0530  WBC 3.1*  --  9.5  HGB 12.2 12.6 11.9*  HCT 35.3* 37.0 36.0  PLT 226  --  199   BMET  Recent Labs  10/07/13 0633 10/07/13 1440 10/07/13 1445 10/08/13 0530  NA 140 142  --  142  K 4.4 3.3*  --  3.3*  CL 102  --   --  103  CO2 28  --   --  29  GLUCOSE 122*  --  109* 123*  BUN 17  --   --  11  CREATININE 0.75  --   --  0.65  CALCIUM 8.4  --   --  7.5*    Studies/Results: Ct Abdomen Pelvis W Contrast  10/06/2013   CLINICAL DATA:  Nausea, vomiting and abdominal pain.  EXAM: CT ABDOMEN AND PELVIS WITH  CONTRAST  TECHNIQUE: Multidetector CT imaging of the abdomen and pelvis was performed using the standard protocol following bolus administration of intravenous contrast.  CONTRAST:  100mL OMNIPAQUE IOHEXOL 300 MG/ML  SOLN  COMPARISON:  CT of the abdomen and pelvis 06/04/2011.  FINDINGS: Lung Bases: Trace right pleural effusion.  Small hiatal hernia.  Abdomen/Pelvis: Mild periportal edema in the liver. The appearance of the liver is otherwise unremarkable. The appearance of the gallbladder, pancreas, spleen, bilateral adrenal glands and bilateral kidneys is unremarkable. Small volume of ascites, most pronounced in the low anatomic pelvis. In the low anatomic pelvis there is some apparent peritoneal enhancement, which could indicate some peritonitis. Extensive dilatation of the small bowel which generally measures up to approximately 4.4 cm in diameter. However, shortly before the terminal ileum there is massive dilatation of the distal ileum which measures up to 7.3 cm in diameter. Beyond this there is abrupt transition to completely decompressed terminal ileum and nearly completely decompressed colon. The stomach is also essentially completely decompressed, totally distorted related to mass effect from the adjacent dilated loops of small bowel. No pneumoperitoneum.  Musculoskeletal: There are no aggressive appearing lytic or blastic lesions noted in the visualized portions of the skeleton.  IMPRESSION: 1. Findings, as above, compatible with small bowel obstruction. There is an apparent transition point in the region of the  distal/terminal ileum, as discussed above. Surgical consultation is recommended. 2. Small volume of ascites, most pronounced in the pelvis where there is some peritoneal enhancement. This may indicate peritonitis. The possibility of perforation is difficult to entirely exclude, but is not strongly favored at this time. No frank pneumoperitoneum is noted. 3. Trace right pleural effusion. 4. Small  hiatal hernia.   Electronically Signed   By: Trudie Reed M.D.   On: 10/06/2013 14:54   Dg Abd 2 Views  10/07/2013   CLINICAL DATA:  Small bowel obstruction.  Abdominal pain.  EXAM: ABDOMEN - 2 VIEW  COMPARISON:  10/06/2013  FINDINGS: Nasogastric tube is coiled within the proximal stomach. Multiple abnormally dilated loops of small bowel are again identified. There are numerous air-fluid levels within both sides of the abdomen.  IMPRESSION: 1. Persistent high-grade small bowel obstruction.   Electronically Signed   By: Signa Kell M.D.   On: 10/07/2013 08:28   Dg Abd Acute W/chest  10/06/2013   CLINICAL DATA:  Abdominal pain with distention.  EXAM: ACUTE ABDOMEN SERIES (ABDOMEN 2 VIEW & CHEST 1 VIEW)  COMPARISON:  CT 06/14/2011  FINDINGS: Chest radiograph demonstrates clear lungs. Normal appearance of the heart and mediastinum. The trachea is midline. There are multiple air-fluid levels in the small bowel with small bowel distention. Lucency underneath the left hemidiaphragm could be related to the stomach. Small bowel loops measure up to 4.1 cm on the supine images. 1.2 cm oval-shaped radiopaque structure in the right hemipelvis of uncertain etiology. This may represent an ingested structure. No significant gas within the colon.  IMPRESSION: Small bowel dilatation with multiple air-fluid levels. Findings are suggestive for a high-grade small-bowel obstruction. Lucency underneath the left hemidiaphragm may be related to the stomach but difficult to completely exclude free air. Recommend further evaluation with CT.  No acute chest findings.  These results were called by telephone at the time of interpretation on 10/06/2013 at 1:08 PM to Dr. Gwyneth Sprout , who verbally acknowledged these results.   Electronically Signed   By: Richarda Overlie M.D.   On: 10/06/2013 13:09    Medications:  I have reviewed the patient's current medications. Scheduled: . enoxaparin (LOVENOX) injection  40 mg Subcutaneous Q24H   . influenza vac split quadrivalent PF  0.5 mL Intramuscular Tomorrow-1000  . insulin pump   Subcutaneous Q4H  . insulin regular  0-10 Units Intravenous TID WC  . morphine   Intravenous Q4H  . pantoprazole (PROTONIX) IV  40 mg Intravenous QHS  . piperacillin-tazobactam (ZOSYN)  IV  3.375 g Intravenous Q8H  . pneumococcal 23 valent vaccine  0.5 mL Intramuscular Tomorrow-1000  . potassium phosphate IVPB (mmol)  15 mmol Intravenous Once   Continuous: . sodium chloride 100 mL/hr at 10/08/13 0537  . Marland KitchenTPN (CLINIMIX-E) Adult     And  . fat emulsion    . insulin (NOVOLIN-R) infusion 1.5 Units/hr (10/07/13 1447)  . lactated ringers 50 mL/hr at 10/07/13 1303  . sodium chloride 0.9 % 1,000 mL infusion 125 mL/hr at 10/07/13 0825    Assessment/Plan: Principal Problem:  SBO (small bowel obstruction)  S/P lysis of adhesions yesterday per general surgery.  Discussed NG suction and need to move around and wait for flatus before refeeding.  Per surgical protocol.  Pain also controlled. DM Type 1, Continue pump on current settings.  Await PICC placement and TPN.  DO NOT ADD INSULIN TO TPN!  We will need to likely increase her basals based on numbers as  often TPN and bypassing intestinal absorption can cause highs.  WIll watch numbers over day and if rising, will increase as a temporary basal by percentage until off TPN and eating normally. Which will likely be days from now. Deprtession/Anxiety: If more anxious, give IV benzodiazepine, but orals on hold  Pain control: Morphine per CCS  Ordered Incentive spirometer due to her fever and in bed status. She is using it hourly. HTN: Diovan held, BP controlled. With fever, would not resume at this point. Also NPO. Nutrition:TPN until flatus as above.    LOS: 2 days   Idamae Coccia W 10/08/2013, 8:10 AM

## 2013-10-08 NOTE — Progress Notes (Signed)
PARENTERAL NUTRITION CONSULT NOTE - Follow-up  Pharmacy Consult for TPN Indication: SBO  No Known Allergies  Patient Measurements: Height: 5\' 2"  (157.5 cm) Weight: 115 lb (52.164 kg) IBW/kg (Calculated) : 50.1  Usual Weight: 52.2 kg  Vital Signs: Temp: 97.4 F (36.3 C) (01/06 0503) Temp src: Oral (01/06 0503) BP: 110/63 mmHg (01/06 0503) Pulse Rate: 98 (01/06 0503) Intake/Output from previous day: 01/05 0701 - 01/06 0700 In: 2880.9 [I.V.:2610.9; NG/GT:20; IV Piggyback:250] Out: 1195 [Urine:650; Emesis/NG output:470; Blood:75] Intake/Output from this shift:    Labs:  Recent Labs  10/06/13 1106 10/07/13 0633 10/07/13 1440 10/08/13 0530  WBC 6.8 3.1*  --  9.5  HGB 14.9 12.2 12.6 11.9*  HCT 42.5 35.3* 37.0 36.0  PLT 290 226  --  199     Recent Labs  10/06/13 1106 10/07/13 0633 10/07/13 1440 10/07/13 1445 10/08/13 0530  NA 133* 140 142  --  142  K 5.3 4.4 3.3*  --  3.3*  CL 91* 102  --   --  103  CO2 28 28  --   --  29  GLUCOSE 255* 122*  --  109* 123*  BUN 23 17  --   --  11  CREATININE 0.81 0.75  --   --  0.65  CALCIUM 10.1 8.4  --   --  7.5*  MG  --   --   --   --  1.9  PHOS  --   --   --   --  2.1*  PROT 8.2  --   --   --  5.3*  ALBUMIN 3.3*  --   --   --  2.2*  AST 27  --   --   --  35  ALT 17  --   --   --  14  ALKPHOS 125*  --   --   --  67  BILITOT 0.6  --   --   --  0.3  TRIG  --   --   --   --  144   Estimated Creatinine Clearance: 58.4 ml/min (by C-G formula based on Cr of 0.65).    Recent Labs  10/07/13 2338 10/08/13 0423 10/08/13 0740  GLUCAP 112* 150* 86   Insulin Requirements in the past 24 hours:  Insulin pump  Current Nutrition:  NPO  Assessment: [name] admitted with severe abdominal pain, N/V x 5d after eating meat for the first time in 3 years. 1/4 CT consistent with small bowel obstruction with transition point in the terminal ileum. S/p exp lap, LOA, ileocecetomy 1/6. PICC line not placed on 1/5 p.m. after surgery -  plan to place today and start TPN tonight.  GI: h/o diabetic gastroparesis. S/p exp lap, LOA, ileocecetomy 1/6 for SBO. NGT with 470 ml out. No BM, flatus yet. Clamping NGT and plan to d/c tonight if still doing ok. Awaiting PICC line placement today to start TPN tonight. MD suspects pt will be slow to recover GI function.  Endo: Type 1 DM on insulin pump managed by patient. See note and recommendation by Dr. Wylene Simmerisovec [phone]). Pt transitioned off of IV insulin gtt and back to home insulin pump 1/5 p.m. Per Dr. Wylene Simmerisovec, will watch CBGs closely and will probably need basal pump rate increased with TPN on board. DO NOT ADD INSULIN TO TPN!  Lytes: Phos 2.1, K 3.3. Will replace with 15 mmol KPhos. Ca corrects to 8.9 (alb 2.2) although ionized Ca low 1.1  on 1/5. Other lytes wnl.  Renal: Scr stable.   Pulm: 2L   Cards: HTN. VSS this a.m.  Hepatobil: WNL  Neuro: Depression/anxiety  ID: Zosyn D#2 for MSSA UTI, abd post-op px. Tmax 101.5. Tc 97.4. WBC wnl. ?narrow abx soon if pt continues to be afeb with no leukocytosis.  Best Practices: Lovenox 40mg /d, IV PPI  TPN Access: PICC to be placed 1/6  TPN day#: 0  Nutritional Goals:  1400-1610 kCal, 62-72 grams of protein per day  Plan:  1. Start Clinimix E 5/15 at 65ml/hr and 20% fat emulation at 7 ml/hr. Will advance to goal as tolerated. 2. Will decrease MIVF (NS) to 85 ml/hr when TPN bag hangs at 1800. Further reduction per MD. 3. Will f/u CBGs closely - Dr. Wylene Simmer and diabetes coordinator managing insulin pump in pt on TPN. No insulin will be added to TPN. If CBGs are hard to control, may need to change back to IV insulin gtt. 4. Potassium phosphate 15 mmol IV x 1  5. F/u TPN labs  Christoper Fabian, PharmD, BCPS Clinical pharmacist, pager [phone]  10/08/2013,8:40 AM

## 2013-10-08 NOTE — Progress Notes (Signed)
1 Day Post-Op  Subjective: 470ml out of NGT, consuming ice chips.  No flatus or BM.  No n/v.    Objective: Vital signs in last 24 hours: Temp:  [97 F (36.1 C)-101.5 F (38.6 C)] 97.4 F (36.3 C) (01/06 0503) Pulse Rate:  [37-125] 98 (01/06 0503) Resp:  [12-20] 16 (01/06 0503) BP: (110-154)/(54-104) 110/63 mmHg (01/06 0503) SpO2:  [78 %-100 %] 96 % (01/06 0503) Last BM Date: 10/02/13  Intake/Output from previous day: 01/05 0701 - 01/06 0700 In: 2880.9 [I.V.:2610.9; NG/GT:20; IV Piggyback:250] Out: 1195 [Urine:650; Emesis/NG output:470; Blood:75] Intake/Output this shift:   PE General appearance: alert, cooperative and NAD.  Resp: clear to auscultation bilaterally Cardio: regular rate and rhythm, S1, S2 normal, no murmur, click, rub or gallop GI: hypoactive bowel sounds, abodmen is soft, appropriately tender.  midline incision is c/d/i.   Lab Results:   Recent Labs  10/07/13 0633 10/07/13 1440 10/08/13 0530  WBC 3.1*  --  9.5  HGB 12.2 12.6 11.9*  HCT 35.3* 37.0 36.0  PLT 226  --  199   BMET  Recent Labs  10/07/13 0633 10/07/13 1440 10/07/13 1445 10/08/13 0530  NA 140 142  --  142  K 4.4 3.3*  --  3.3*  CL 102  --   --  103  CO2 28  --   --  29  GLUCOSE 122*  --  109* 123*  BUN 17  --   --  11  CREATININE 0.75  --   --  0.65  CALCIUM 8.4  --   --  7.5*   PT/INR No results found for this basename: LABPROT, INR,  in the last 72 hours ABG  Recent Labs  10/06/13 1354 10/07/13 1440  HCO3 29.2* 28.4*    Studies/Results: Ct Abdomen Pelvis W Contrast  10/06/2013   CLINICAL DATA:  Nausea, vomiting and abdominal pain.  EXAM: CT ABDOMEN AND PELVIS WITH CONTRAST  TECHNIQUE: Multidetector CT imaging of the abdomen and pelvis was performed using the standard protocol following bolus administration of intravenous contrast.  CONTRAST:  100mL OMNIPAQUE IOHEXOL 300 MG/ML  SOLN  COMPARISON:  CT of the abdomen and pelvis 06/04/2011.  FINDINGS: Lung Bases: Trace right  pleural effusion.  Small hiatal hernia.  Abdomen/Pelvis: Mild periportal edema in the liver. The appearance of the liver is otherwise unremarkable. The appearance of the gallbladder, pancreas, spleen, bilateral adrenal glands and bilateral kidneys is unremarkable. Small volume of ascites, most pronounced in the low anatomic pelvis. In the low anatomic pelvis there is some apparent peritoneal enhancement, which could indicate some peritonitis. Extensive dilatation of the small bowel which generally measures up to approximately 4.4 cm in diameter. However, shortly before the terminal ileum there is massive dilatation of the distal ileum which measures up to 7.3 cm in diameter. Beyond this there is abrupt transition to completely decompressed terminal ileum and nearly completely decompressed colon. The stomach is also essentially completely decompressed, totally distorted related to mass effect from the adjacent dilated loops of small bowel. No pneumoperitoneum.  Musculoskeletal: There are no aggressive appearing lytic or blastic lesions noted in the visualized portions of the skeleton.  IMPRESSION: 1. Findings, as above, compatible with small bowel obstruction. There is an apparent transition point in the region of the distal/terminal ileum, as discussed above. Surgical consultation is recommended. 2. Small volume of ascites, most pronounced in the pelvis where there is some peritoneal enhancement. This may indicate peritonitis. The possibility of perforation is difficult to entirely  exclude, but is not strongly favored at this time. No frank pneumoperitoneum is noted. 3. Trace right pleural effusion. 4. Small hiatal hernia.   Electronically Signed   By: Trudie Reed M.D.   On: 10/06/2013 14:54   Dg Abd 2 Views  10/07/2013   CLINICAL DATA:  Small bowel obstruction.  Abdominal pain.  EXAM: ABDOMEN - 2 VIEW  COMPARISON:  10/06/2013  FINDINGS: Nasogastric tube is coiled within the proximal stomach. Multiple  abnormally dilated loops of small bowel are again identified. There are numerous air-fluid levels within both sides of the abdomen.  IMPRESSION: 1. Persistent high-grade small bowel obstruction.   Electronically Signed   By: Signa Kell M.D.   On: 10/07/2013 08:28   Dg Abd Acute W/chest  10/06/2013   CLINICAL DATA:  Abdominal pain with distention.  EXAM: ACUTE ABDOMEN SERIES (ABDOMEN 2 VIEW & CHEST 1 VIEW)  COMPARISON:  CT 06/14/2011  FINDINGS: Chest radiograph demonstrates clear lungs. Normal appearance of the heart and mediastinum. The trachea is midline. There are multiple air-fluid levels in the small bowel with small bowel distention. Lucency underneath the left hemidiaphragm could be related to the stomach. Small bowel loops measure up to 4.1 cm on the supine images. 1.2 cm oval-shaped radiopaque structure in the right hemipelvis of uncertain etiology. This may represent an ingested structure. No significant gas within the colon.  IMPRESSION: Small bowel dilatation with multiple air-fluid levels. Findings are suggestive for a high-grade small-bowel obstruction. Lucency underneath the left hemidiaphragm may be related to the stomach but difficult to completely exclude free air. Recommend further evaluation with CT.  No acute chest findings.  These results were called by telephone at the time of interpretation on 10/06/2013 at 1:08 PM to Dr. Gwyneth Sprout , who verbally acknowledged these results.   Electronically Signed   By: Richarda Overlie M.D.   On: 10/06/2013 13:09    Anti-infectives: Anti-infectives   Start     Dose/Rate Route Frequency Ordered Stop   10/07/13 1815  piperacillin-tazobactam (ZOSYN) IVPB 3.375 g     3.375 g 12.5 mL/hr over 240 Minutes Intravenous Every 8 hours 10/07/13 1811     10/07/13 1600  cefTRIAXone (ROCEPHIN) 1 g in dextrose 5 % 50 mL IVPB  Status:  Discontinued     1 g 100 mL/hr over 30 Minutes Intravenous Every 24 hours 10/06/13 1816 10/07/13 1811   10/07/13 0900  [MAR  Hold]  ceFAZolin (ANCEF) IVPB 1 g/50 mL premix     (On MAR Hold since 10/07/13 1201)   1 g 100 mL/hr over 30 Minutes Intravenous  Once 10/07/13 0849 10/07/13 1321   10/07/13 0000  cefTRIAXone (ROCEPHIN) 1 g in dextrose 5 % 50 mL IVPB  Status:  Discontinued     1 g 100 mL/hr over 30 Minutes Intravenous Every 24 hours 10/06/13 1816 10/06/13 1835   10/06/13 1515  cefTRIAXone (ROCEPHIN) 1 g in dextrose 5 % 50 mL IVPB     1 g 100 mL/hr over 30 Minutes Intravenous  Once 10/06/13 1509 10/06/13 1705      Assessment/Plan: Small bowel obstruction S/p  Exploratory laparotomy with LOA, ileocecectomy Dr. Derrell Lolling 10/07/12 POD #1 -clamp NGT, may have ice chips.  If doing okay this evening remove NGT and c/w clears. -mobilize -IS -continue PCA for now T1DM -CBGs are stable, insulin pump HTN -stable PCM -TPN UTI Gastroparesis -advance diet slowly Hypokalemia -supplement per pharmacy  VTE prophylaxis -SCDs, lovenox   LOS: 2 days  Bonner Puna, Andreas Sobolewski ANP-BC  10/08/2013 8:26 AM

## 2013-10-08 NOTE — Progress Notes (Signed)
Pt has been intermittently confused today. Discussed these memory impairment/confusion episodes with daughter at bedside. VSS. CBG WNL. Will continue to monitor.

## 2013-10-08 NOTE — Progress Notes (Signed)
Inpatient Diabetes Program Recommendations  AACE/ADA: New Consensus Statement on Inpatient Glycemic Control (2013)  Target Ranges:  Prepandial:   less than 140 mg/dL      Peak postprandial:   less than 180 mg/dL (1-2 hours)      Critically ill patients:  140 - 180 mg/dL     Results for [name], Taylor Adams (MRN [id number]) as of 10/08/2013 12:49  Ref. Range 10/07/2013 23:38 10/08/2013 04:23 10/08/2013 07:40  Glucose-Capillary Latest Range: 70-99 mg/dL 119112 (H) 147150 (H) 86    **POD#1.  Patient restarted on her insulin pump last night.  CBGs stable.  **Patient to start on TPN tonight.  Noted Dr. Wylene Simmerisovec does not want pharmacy to add insulin to patient's TPN.  Instead, Dr. Wylene Simmerisovec plans to adjust patient's basal rates on her insulin pump as needed to handle her glucose elevations secondary to the TPN.  **Spoke with patient in person today.  Husband present at bedside and very knowledgeable about patient's insulin pump as well.  Patient is A&O and able to independently manage her insulin pump at present.  **Patient's normal basal rates are as follows:  12AM-     0.4 units/hr 1:30AM-  0.65 units/hr 5:30AM-  0.875 units/hr 8AM-      0.3 units/hr 3PM-      0.4 units/hr 8PM-      0.3 units/hr  Total basal in 24 hour period= 10.6875 units basal insulin  Carbohydrate ratio= 1 unit for every 30 grams carbohydrates  Correction factor= 1 unit for every 90 mg/dl above target  Target CBG= 130 mg/dl   Will follow. Ambrose FinlandJeannine Johnston [name] RN, MSN, CDE Diabetes Coordinator Inpatient Diabetes Program Team Pager: [phone] (8a-10p)

## 2013-10-08 NOTE — Progress Notes (Signed)
I have seen and examined the pt and agree with PA-Riebock's progress note. Keep NGT to LIWS Con't abx Ambulate

## 2013-10-08 NOTE — Progress Notes (Signed)
Explained PICC procedure, risks and benefits to patient.  She signed consent but wants to wait until later to have procedure done.  Notified staff RN

## 2013-10-08 NOTE — Progress Notes (Signed)
Peripherally Inserted Central Catheter/Midline Placement  The IV Nurse has discussed with the patient and/or persons authorized to consent for the patient, the purpose of this procedure and the potential benefits and risks involved with this procedure.  The benefits include less needle sticks, lab draws from the catheter and patient may be discharged home with the catheter.  Risks include, but not limited to, infection, bleeding, blood clot (thrombus formation), and puncture of an artery; nerve damage and irregular heat beat.  Alternatives to this procedure were also discussed.  PICC/Midline Placement Documentation        Vevelyn PatDuncan, Airika Alkhatib Jean 10/08/2013, 11:13 AM

## 2013-10-09 ENCOUNTER — Encounter (HOSPITAL_COMMUNITY): Payer: Self-pay | Admitting: General Surgery

## 2013-10-09 LAB — BASIC METABOLIC PANEL
BUN: 11 mg/dL (ref 6–23)
BUN: 10 mg/dL (ref 6–23)
CALCIUM: 7.4 mg/dL — AB (ref 8.4–10.5)
CHLORIDE: 105 meq/L (ref 96–112)
CHLORIDE: 110 meq/L (ref 96–112)
CO2: 28 mEq/L (ref 19–32)
CO2: 24 meq/L (ref 19–32)
Calcium: 7.6 mg/dL — ABNORMAL LOW (ref 8.4–10.5)
Creatinine, Ser: 0.55 mg/dL (ref 0.50–1.10)
Creatinine, Ser: 0.53 mg/dL (ref 0.50–1.10)
GFR calc non Af Amer: >90 mL/min (ref >90)
Glucose, Bld: 263 mg/dL — ABNORMAL HIGH (ref 70–99)
Glucose, Bld: 234 mg/dL — ABNORMAL HIGH (ref 70–99)
POTASSIUM: 2.9 meq/L — AB (ref 3.7–5.3)
Potassium: 3.8 mEq/L (ref 3.7–5.3)
SODIUM: 142 meq/L (ref 137–147)
SODIUM: 145 meq/L (ref 137–147)

## 2013-10-09 LAB — GLUCOSE, CAPILLARY
GLUCOSE-CAPILLARY: 228 mg/dL — AB (ref 70–99)
GLUCOSE-CAPILLARY: 247 mg/dL — AB (ref 70–99)
GLUCOSE-CAPILLARY: 172 mg/dL — AB (ref 70–99)
GLUCOSE-CAPILLARY: 224 mg/dL — AB (ref 70–99)
GLUCOSE-CAPILLARY: 191 mg/dL — AB (ref 70–99)
Glucose-Capillary: 267 mg/dL — ABNORMAL HIGH (ref 70–99)

## 2013-10-09 LAB — PHOSPHORUS: Phosphorus: 1.3 mg/dL — ABNORMAL LOW (ref 2.3–4.6)

## 2013-10-09 MED ORDER — TRACE MINERALS CR-CU-F-FE-I-MN-MO-SE-ZN IV SOLN
INTRAVENOUS | Status: AC
  Administered 2013-10-09: 17:00:00 via INTRAVENOUS
  Filled 2013-10-09: qty 1000

## 2013-10-09 MED ORDER — MORPHINE SULFATE 2 MG/ML IJ SOLN
1.0000 mg | INTRAMUSCULAR | Status: DC | PRN
  Administered 2013-10-09: 2 mg via INTRAVENOUS
  Administered 2013-10-09 – 2013-10-12 (×18): 4 mg via INTRAVENOUS
  Filled 2013-10-09 (×3): qty 2
  Filled 2013-10-09: qty 1
  Filled 2013-10-09 (×5): qty 2
  Filled 2013-10-09: qty 1
  Filled 2013-10-09 (×11): qty 2

## 2013-10-09 MED ORDER — FAT EMULSION 20 % IV EMUL
250.0000 mL | INTRAVENOUS | Status: AC
  Administered 2013-10-09: 250 mL via INTRAVENOUS
  Filled 2013-10-09: qty 250

## 2013-10-09 MED ORDER — MORPHINE SULFATE 2 MG/ML IJ SOLN
2.0000 mg | Freq: Once | INTRAMUSCULAR | Status: AC
Start: 2013-10-09 — End: 2013-10-09
  Administered 2013-10-09: 2 mg via INTRAVENOUS

## 2013-10-09 MED ORDER — SODIUM CHLORIDE 0.9 % IV SOLN
30.0000 mmol | Freq: Once | INTRAVENOUS | Status: AC
  Administered 2013-10-09: 30 mmol via INTRAVENOUS
  Filled 2013-10-09: qty 10

## 2013-10-09 MED ORDER — INSULIN PUMP
SUBCUTANEOUS | Status: DC
  Administered 2013-10-09: 0.6 via SUBCUTANEOUS
  Administered 2013-10-09: 1.4 via SUBCUTANEOUS
  Administered 2013-10-10: 09:00:00 via SUBCUTANEOUS
  Administered 2013-10-10: 0.9 via SUBCUTANEOUS
  Administered 2013-10-10: 0.7 via SUBCUTANEOUS
  Filled 2013-10-09: qty 1

## 2013-10-09 MED ORDER — POTASSIUM CHLORIDE 10 MEQ/50ML IV SOLN
10.0000 meq | INTRAVENOUS | Status: AC
  Administered 2013-10-09 (×4): 10 meq via INTRAVENOUS
  Filled 2013-10-09 (×4): qty 50

## 2013-10-09 NOTE — Progress Notes (Signed)
2 Days Post-Op  Subjective: Pt up in bathroom washing up, ambulating.  No flatus or BM.  Pain controlled.  Objective: Vital signs in last 24 hours: Temp:  [97.7 F (36.5 C)-98.4 F (36.9 C)] 98.4 F (36.9 C) (01/07 0557) Pulse Rate:  [84-100] 84 (01/07 0557) Resp:  [18-23] 20 (01/07 0557) BP: (107-122)/(49-65) 107/49 mmHg (01/07 0557) SpO2:  [93 %-98 %] 93 % (01/07 0557) Last BM Date: 10/02/13  Intake/Output from previous day: 01/06 0701 - 01/07 0700 In: 2835 [I.V.:2251; TPN:584] Out: 1690 [Urine:500; Emesis/NG output:1190] Intake/Output this shift:   PE  General appearance: alert, cooperative and NAD.  Resp: clear to auscultation bilaterally  Cardio: regular rate and rhythm, S1, S2 normal, no murmur, click, rub or gallop  GI: hypoactive bowel sounds, abodmen is soft, appropriately tender. Midline incision is clean, staples intact, no erythema.    Lab Results:   Recent Labs  10/07/13 0633 10/07/13 1440 10/08/13 0530  WBC 3.1*  --  9.5  HGB 12.2 12.6 11.9*  HCT 35.3* 37.0 36.0  PLT 226  --  199   BMET  Recent Labs  10/08/13 0530 10/09/13 0523  NA 142 142  K 3.3* 2.9*  CL 103 105  CO2 29 28  GLUCOSE 123* 263*  BUN 11 11  CREATININE 0.65 0.55  CALCIUM 7.5* 7.6*   PT/INR No results found for this basename: LABPROT, INR,  in the last 72 hours ABG  Recent Labs  10/06/13 1354 10/07/13 1440  HCO3 29.2* 28.4*    Studies/Results: No results found.  Anti-infectives: Anti-infectives   Start     Dose/Rate Route Frequency Ordered Stop   10/07/13 1815  piperacillin-tazobactam (ZOSYN) IVPB 3.375 g     3.375 g 12.5 mL/hr over 240 Minutes Intravenous Every 8 hours 10/07/13 1811     10/07/13 1600  cefTRIAXone (ROCEPHIN) 1 g in dextrose 5 % 50 mL IVPB  Status:  Discontinued     1 g 100 mL/hr over 30 Minutes Intravenous Every 24 hours 10/06/13 1816 10/07/13 1811   10/07/13 0900  [MAR Hold]  ceFAZolin (ANCEF) IVPB 1 g/50 mL premix     (On MAR Hold since  10/07/13 1201)   1 g 100 mL/hr over 30 Minutes Intravenous  Once 10/07/13 0849 10/07/13 1321   10/07/13 0000  cefTRIAXone (ROCEPHIN) 1 g in dextrose 5 % 50 mL IVPB  Status:  Discontinued     1 g 100 mL/hr over 30 Minutes Intravenous Every 24 hours 10/06/13 1816 10/06/13 1835   10/06/13 1515  cefTRIAXone (ROCEPHIN) 1 g in dextrose 5 % 50 mL IVPB     1 g 100 mL/hr over 30 Minutes Intravenous  Once 10/06/13 1509 10/06/13 1705      Assessment/Plan: Small bowel obstruction  S/p Exploratory laparotomy with LOA, ileocecectomy Dr. Derrell Lollingamirez 10/07/12  POD #2  -continue with NGT to intermittent suction -mobilize  -IS  -IV pain meds T1DM  -CBGs are increased, insulin pump adjusted this AM HTN  -stable  PCM  -TPN  UTI  Gastroparesis  -advance diet slowly  Hypokalemia  -supplement per pharmacy  VTE prophylaxis  -SCDs, lovenox   LOS: 3 days   Dorianne Perret ANP-BC 10/09/2013 9:16 AM

## 2013-10-09 NOTE — Progress Notes (Signed)
Inpatient Diabetes Program Recommendations  AACE/ADA: New Consensus Statement on Inpatient Glycemic Control (2013)  Target Ranges:  Prepandial:   less than 140 mg/dL      Peak postprandial:   less than 180 mg/dL (1-2 hours)      Critically ill patients:  140 - 180 mg/dL     Results for [name], Jasneet W (MRN [id number]) as of 10/09/2013 09:00  Ref. Range 10/09/2013 00:22 10/09/2013 04:05 10/09/2013 08:18  Glucose-Capillary Latest Range: 70-99 mg/dL 409267 (H) 811228 (H) 914247 (H)    **Noted CBGs elevated secondary to TPN that was started last night at ~6pm.  **Noted Dr. Wylene Simmerisovec has assisted patient to use a temporary basal rate at 140% of normal.  Noted patient was given instructions by Dr. Wylene Simmerisovec to check her CBGs Q2 hours until her CBGs drop below 200 mg/dl.  Then patient should switch to Q4 hour CBG checks with coverage if needed.   Will follow. Ambrose FinlandJeannine Johnston Reiana Poteet RN, MSN, CDE Diabetes Coordinator Inpatient Diabetes Program Team Pager: [phone] (8a-10p)

## 2013-10-09 NOTE — Progress Notes (Signed)
PARENTERAL NUTRITION CONSULT NOTE - Follow-up  Pharmacy Consult for TPN Indication: SBO  No Known Allergies  Patient Measurements: Height: 5\' 2"  (157.5 cm) Weight: 115 lb (52.164 kg) IBW/kg (Calculated) : 50.1  Usual Weight: 52.2 kg  Vital Signs: Temp: 98.4 F (36.9 C) (01/07 0557) Temp src: Oral (01/07 0557) BP: 107/49 mmHg (01/07 0557) Pulse Rate: 84 (01/07 0557) Intake/Output from previous day: 01/06 0701 - 01/07 0700 In: 2835 [I.V.:2251; TPN:584] Out: 1690 [Urine:500; Emesis/NG output:1190] Intake/Output from this shift:    Labs:  Recent Labs  10/06/13 1106 10/07/13 0633 10/07/13 1440 10/08/13 0530  WBC 6.8 3.1*  --  9.5  HGB 14.9 12.2 12.6 11.9*  HCT 42.5 35.3* 37.0 36.0  PLT 290 226  --  199     Recent Labs  10/06/13 1106 10/07/13 0633 10/07/13 1440 10/07/13 1445 10/08/13 0530 10/09/13 0523  NA 133* 140 142  --  142 142  K 5.3 4.4 3.3*  --  3.3* 2.9*  CL 91* 102  --   --  103 105  CO2 28 28  --   --  29 28  GLUCOSE 255* 122*  --  109* 123* 263*  BUN 23 17  --   --  11 11  CREATININE 0.81 0.75  --   --  0.65 0.55  CALCIUM 10.1 8.4  --   --  7.5* 7.6*  MG  --   --   --   --  1.9  --   PHOS  --   --   --   --  2.1* 1.3*  PROT 8.2  --   --   --  5.3*  --   ALBUMIN 3.3*  --   --   --  2.2*  --   AST 27  --   --   --  35  --   ALT 17  --   --   --  14  --   ALKPHOS 125*  --   --   --  67  --   BILITOT 0.6  --   --   --  0.3  --   PREALBUMIN  --   --   --   --  3.7*  --   TRIG  --   --   --   --  144  --    Estimated Creatinine Clearance: 58.4 ml/min (by C-G formula based on Cr of 0.55).    Recent Labs  10/09/13 0022 10/09/13 0405 10/09/13 0818  GLUCAP 267* 228* 247*   Insulin Requirements in the past 12 hours:  Insulin pump (4.5 units)  Current Nutrition:  Clinimix E 5/15 at 59ml/hr providing 1018 kcal, 48 gm protein  Assessment: [name] admitted with severe abdominal pain, N/V x 5d after eating meat for the first time in 3 years.  1/4 CT consistent with small bowel obstruction with transition point in the terminal ileum. S/p exp lap, LOA, ileocecetomy 1/6.   GI: h/o diabetic gastroparesis. S/p exp lap, LOA, ileocecetomy 1/6 for SBO. NGT with 1190 ml out yesterday. No BM, flatus yet. MD suspects pt will be slow to recover GI function. Prealbumin 3.7 (low) at baseline.  Endo: Type 1 DM on insulin pump managed by patient. See note and recommendation by Dr. Wylene Simmer [phone]). CBGs high since TPN start - not surprising- MD adjusted basal rate of insulin pump to 140%. Per Dr. Wylene Simmer, DO NOT ADD INSULIN TO TPN!  Lytes: Phos 1.3, K 2.9.  Will replace with 30 mmol KPhos (also noted KCl 10mEq IV x 4 per MD order). Pt appears to be experiencing refeeding (probably due to low po intake PTA). Ca corrects to 9 (alb 2.2) although ionized Ca low 1.1 on 1/5. Other lytes wnl.  Renal: Scr stable. UOP not charted accurately. Pt remains on NS at 85 ml/hr and order for LR at 7450ml/hr but not hanging.  Pulm: RA  Cards: h/o HTN. VSS.  Hepatobil: WNL. TG 144 at baseline.  Neuro: Depression/anxiety  ID: Zosyn D#3 for MSSA UTI, abd post-op px. Afeb. WBC wnl. ?narrow abx soon if pt continues to be afeb with no leukocytosis.  Best Practices: Lovenox 40mg /d, IV PPI  TPN Access: PICC 1/6  TPN day#: 1  Nutritional Goals:  1400-1610 kCal, 62-72 grams of protein per day  Plan:  1. Continue Clinimix E 5/15 at 1140ml/hr and 20% fat emulsion at 7 ml/hr. Will advance to goal once electrolytes, glucose control improves. 2. Continue MIVF (NS) at 85 ml/hr. [facility].Further reduction of MIVF per MD. 3. Potassium phosphate 30 mmol IV x 1. 4. Will f/u CBGs closely - Dr. Wylene Simmerisovec and diabetes coordinator managing insulin pump in pt on TPN. No insulin will be added to TPN. If CBGs remain hard to control, may need to change back to IV insulin gtt. 5. F/u TPN labs  **MD - with using premade Clinimix for TPN, we are unable to  change amount of electrolytes in bag - bag has 7330mEq/L K and 15 mmol/L Phos; therefore, any additional electrolyte replacement will need to be made by IV bolus outside of TPN**  Christoper Fabianaron Seleta Hovland, PharmD, BCPS Clinical pharmacist, pager [phone]  10/09/2013,9:06 AM

## 2013-10-09 NOTE — Progress Notes (Addendum)
CRITICAL VALUE ALERT  Critical value received:  0735  Date of notification:  10/09/2013  Time of notification:  0735  Critical value read back:yes[facility]  Nurse who received alert:  Ishmael HolterBrandyn Talar Fraley  MD notified (1st page): Anette RiedelEmina Riebick,NP  Time of first page:  0740  MD notified (2nd page):  Time of second page:  Responding MD:  Ivette LoyalEmina Riebick, NP  Time MD responded:  Notified NP face to face and called Pharmacy to notify them because they will be adjusting her TPN

## 2013-10-09 NOTE — Progress Notes (Signed)
Subjective: Had a bit of hallucination, likely due to morphine yesterday.  She is trying to use it as little as possible to get her gut mobility going.  Did some walking yesterday.  Was started on TPN at about 6 last night and her sugars are not surprisingly high this AM.  We made adjustments to her pump, see below.  Objective: Vital signs in last 24 hours: Temp:  [97.7 F (36.5 C)-98.4 F (36.9 C)] 98.4 F (36.9 C) (01/07 0557) Pulse Rate:  [84-100] 84 (01/07 0557) Resp:  [18-23] 20 (01/07 0557) BP: (107-122)/(49-65) 107/49 mmHg (01/07 0557) SpO2:  [93 %-98 %] 93 % (01/07 0557) Weight change:  Last BM Date: 10/02/13  Intake/Output from previous day: 01/06 0701 - 01/07 0700 In: 2835 [I.V.:2251; TPN:584] Out: 1690 [Urine:500; Emesis/NG output:1190] Intake/Output this shift:   General appearance: alert, cooperative and appears stated age  Neck: no adenopathy, no carotid bruit, no JVD and thyroid not enlarged, symmetric, no tenderness/mass/nodules  Resp: clear to auscultation bilaterally  Cardio: regular rate and rhythm, S1, S2 normal, no murmur, click, rub or gallop  GI: incision dressed, mild tenderness  Extremities: extremities normal, atraumatic, no cyanosis or edema  Pulses: 2+ and symmetric  Lymph nodes: Cervical adenopathy: no cervical lymphadenopathy  Neurologic: Alert and oriented X 3, normal strength and tone. Normal symmetric reflexes.   Lab Results:  Recent Labs  10/07/13 0633 10/07/13 1440 10/08/13 0530  WBC 3.1*  --  9.5  HGB 12.2 12.6 11.9*  HCT 35.3* 37.0 36.0  PLT 226  --  199   BMET  Recent Labs  10/08/13 0530 10/09/13 0523  NA 142 142  K 3.3* 2.9*  CL 103 105  CO2 29 28  GLUCOSE 123* 263*  BUN 11 11  CREATININE 0.65 0.55  CALCIUM 7.5* 7.6*    Studies/Results: Dg Abd 2 Views  10/07/2013   CLINICAL DATA:  Small bowel obstruction.  Abdominal pain.  EXAM: ABDOMEN - 2 VIEW  COMPARISON:  10/06/2013  FINDINGS: Nasogastric tube is coiled within the  proximal stomach. Multiple abnormally dilated loops of small bowel are again identified. There are numerous air-fluid levels within both sides of the abdomen.  IMPRESSION: 1. Persistent high-grade small bowel obstruction.   Electronically Signed   By: Signa Kellaylor  Stroud M.D.   On: 10/07/2013 08:28    Medications:  I have reviewed the patient's current medications. Scheduled: . enoxaparin (LOVENOX) injection  40 mg Subcutaneous Q24H  . influenza vac split quadrivalent PF  0.5 mL Intramuscular Tomorrow-1000  . insulin pump   Subcutaneous Q4H  . insulin regular  0-10 Units Intravenous TID WC  . pantoprazole (PROTONIX) IV  40 mg Intravenous QHS  . piperacillin-tazobactam (ZOSYN)  IV  3.375 g Intravenous Q8H  . pneumococcal 23 valent vaccine  0.5 mL Intramuscular Tomorrow-1000   Continuous: . Marland Kitchen.TPN (CLINIMIX-E) Adult 40 mL/hr at 10/08/13 1749   And  . fat emulsion 250 mL (10/08/13 1749)  . insulin (NOVOLIN-R) infusion 1.5 Units/hr (10/07/13 1447)  . lactated ringers 50 mL/hr at 10/07/13 1303  . sodium chloride 0.9 % 1,000 mL infusion 85 mL/hr at 10/08/13 1800   WJX:BJYNWGNFPRN:dextrose, diphenhydrAMINE, diphenhydrAMINE, morphine injection, ondansetron, phenol, sodium chloride  Assessment/Plan: Principal Problem:  SBO (small bowel obstruction)  S/P lysis of adhesions yesterday per general surgery. Discussed NG suction and need to move around and wait for flatus before refeeding. Per surgical protocol. Pain also controlled.  DM Type 1, Continue pump, now with 24 hour temp basal of 140%  Will have testing q2hours until CBG <200, then resume q4h.  She has bolused for her 290 this AM already.. DO NOT ADD INSULIN TO TPN!  Deprtession/Anxiety: If more anxious, give IV benzodiazepine, but orals on hold  Pain control: Morphine per CCS  She is using minimally.  Had some mild hallucination, but better. Incentive spirometer, using it hourly.  HTN: Diovan held, BP controlled. With fever, would not resume at this point.  Also NPO.  Nutrition:TPN until flatus as above. Hypokalemia- Will replace IV.  TPN needs to be adjusted for this as well.   LOS: 3 days   Taylor Adams W 10/09/2013, 8:11 AM

## 2013-10-09 NOTE — Progress Notes (Signed)
I have seen and examined the pt and agree with NP-Reibock's progress note. Still with hypoactive BS- Cont' NGT Encourage ambulation

## 2013-10-10 DIAGNOSIS — E46 Unspecified protein-calorie malnutrition: Secondary | ICD-10-CM

## 2013-10-10 LAB — COMPREHENSIVE METABOLIC PANEL
ALBUMIN: 1.9 g/dL — AB (ref 3.5–5.2)
ALT: 13 U/L (ref 0–35)
AST: 29 U/L (ref 0–37)
Alkaline Phosphatase: 76 U/L (ref 39–117)
BUN: 9 mg/dL (ref 6–23)
CO2: 28 mEq/L (ref 19–32)
Calcium: 7.5 mg/dL — ABNORMAL LOW (ref 8.4–10.5)
Chloride: 110 mEq/L (ref 96–112)
Creatinine, Ser: 0.51 mg/dL (ref 0.50–1.10)
GFR calc non Af Amer: >90 mL/min (ref >90)
Glucose, Bld: 234 mg/dL — ABNORMAL HIGH (ref 70–99)
POTASSIUM: 3.3 meq/L — AB (ref 3.7–5.3)
Sodium: 148 mEq/L — ABNORMAL HIGH (ref 137–147)
TOTAL PROTEIN: 4.9 g/dL — AB (ref 6.0–8.3)
Total Bilirubin: <0.2 mg/dL — ABNORMAL LOW (ref 0.3–1.2)

## 2013-10-10 LAB — GLUCOSE, CAPILLARY
GLUCOSE-CAPILLARY: 174 mg/dL — AB (ref 70–99)
Glucose-Capillary: 144 mg/dL — ABNORMAL HIGH (ref 70–99)
Glucose-Capillary: 173 mg/dL — ABNORMAL HIGH (ref 70–99)
Glucose-Capillary: 185 mg/dL — ABNORMAL HIGH (ref 70–99)
Glucose-Capillary: 213 mg/dL — ABNORMAL HIGH (ref 70–99)
Glucose-Capillary: 238 mg/dL — ABNORMAL HIGH (ref 70–99)

## 2013-10-10 LAB — MAGNESIUM: Magnesium: 2.1 mg/dL (ref 1.5–2.5)

## 2013-10-10 LAB — PHOSPHORUS: PHOSPHORUS: 2.3 mg/dL (ref 2.3–4.6)

## 2013-10-10 MED ORDER — SODIUM CHLORIDE 0.45 % IV SOLN
INTRAVENOUS | Status: DC
  Administered 2013-10-10 – 2013-10-13 (×6): via INTRAVENOUS

## 2013-10-10 MED ORDER — INSULIN PUMP
SUBCUTANEOUS | Status: DC
  Administered 2013-10-10 (×2): via SUBCUTANEOUS
  Administered 2013-10-10: 0.7 via SUBCUTANEOUS
  Administered 2013-10-11 (×2): 0.6 via SUBCUTANEOUS
  Administered 2013-10-11: 0.3 via SUBCUTANEOUS
  Filled 2013-10-10: qty 1

## 2013-10-10 MED ORDER — POTASSIUM CHLORIDE 10 MEQ/50ML IV SOLN
10.0000 meq | INTRAVENOUS | Status: AC
  Administered 2013-10-10 (×4): 10 meq via INTRAVENOUS
  Filled 2013-10-10 (×4): qty 50

## 2013-10-10 MED ORDER — TRACE MINERALS CR-CU-F-FE-I-MN-MO-SE-ZN IV SOLN
INTRAVENOUS | Status: AC
  Administered 2013-10-10: 17:00:00 via INTRAVENOUS
  Filled 2013-10-10: qty 1000

## 2013-10-10 MED ORDER — FAT EMULSION 20 % IV EMUL
250.0000 mL | INTRAVENOUS | Status: AC
  Administered 2013-10-10: 250 mL via INTRAVENOUS
  Filled 2013-10-10: qty 250

## 2013-10-10 NOTE — Progress Notes (Addendum)
PARENTERAL NUTRITION CONSULT NOTE - Follow-up  Pharmacy Consult for TPN Indication: SBO  No Known Allergies  Patient Measurements: Height: 5\' 2"  (157.5 cm) Weight: 115 lb (52.164 kg) IBW/kg (Calculated) : 50.1  Usual Weight: 52.2 kg  Vital Signs: Temp: 97.8 F (36.6 C) (01/08 0548) Temp src: Oral (01/07 2126) BP: 100/55 mmHg (01/08 0548) Pulse Rate: 84 (01/08 0548) Intake/Output from previous day: 01/07 0701 - 01/08 0700 In: 3961.6 [P.O.:240; I.V.:1439; NG/GT:350; IV Piggyback:810; TPN:1122.6] Out: 1250 [Urine:650; Emesis/NG output:600] Intake/Output from this shift:    Labs:  Recent Labs  10/07/13 1440 10/08/13 0530  WBC  --  9.5  HGB 12.6 11.9*  HCT 37.0 36.0  PLT  --  199     Recent Labs  10/07/13 1440  10/08/13 0530 10/09/13 0523 10/09/13 1610 10/10/13 0515  NA 142  --  142 142 145 148*  K 3.3*  --  3.3* 2.9* 3.8 3.3*  CL  --   < > 103 105 110 110  CO2  --   < > 29 28 24 28   GLUCOSE  --   < > 123* 263* 234* 234*  BUN  --   < > 11 11 10 9   CREATININE  --   < > 0.65 0.55 0.53 0.51  CALCIUM  --   < > 7.5* 7.6* 7.4* 7.5*  MG  --   --  1.9  --   --  2.1  PHOS  --   --  2.1* 1.3*  --  2.3  PROT  --   --  5.3*  --   --  4.9*  ALBUMIN  --   --  2.2*  --   --  1.9*  AST  --   --  35  --   --  29  ALT  --   --  14  --   --  13  ALKPHOS  --   --  67  --   --  76  BILITOT  --   --  0.3  --   --  <0.2*  PREALBUMIN  --   --  3.7*  --   --   --   TRIG  --   --  144  --   --   --   < > = values in this interval not displayed. Estimated Creatinine Clearance: 58.4 ml/min (by C-G formula based on Cr of 0.51).    Recent Labs  10/09/13 2350 10/10/13 0404 10/10/13 0809  GLUCAP 213* 238* 144*   Insulin Requirements in the past 24 hours:  Insulin pump (4.7 units)  Current Nutrition:  Clinimix E 5/15 at 3740ml/hr providing 1018 kcal, 48 gm protein  Assessment: [name] admitted with severe abdominal pain, N/V x 5d after eating meat for the first time in 3  years. 1/4 CT consistent with small bowel obstruction with transition point in the terminal ileum. S/p exp lap, LOA, ileocecetomy 1/6.   GI: h/o diabetic gastroparesis. S/p exp lap, LOA, ileocecetomy 1/6 for SBO. NGT with 600 ml out yesterday - trending down. No BM, flatus yet. MD suspects pt will be slow to recover GI function. Prealbumin 3.7 (low) at baseline.  Endo: Type 1 DM on insulin pump managed by patient. See note and recommendation by Dr. Wylene Simmerisovec [phone]). CBGs remain high since TPN start - not surprising- MD adjusted basal rate of insulin pump to 160%. Per Dr. Wylene Simmerisovec, DO NOT ADD INSULIN TO TPN!  Lytes: K 3.3- MD  replacing again. Phos, Mg wnl. Na elevated - suspect multiple days of NS as MIVF contributing to this. Ca corrects to 9.2 (alb 2.2) although ionized Ca low 1.1 on 1/5. Other lytes wnl.  Renal: Scr stable. UOP not charted accurately. Pt remains on NS at 85 ml/hr.  Pulm: RA  Cards: h/o HTN. VSS.  Hepatobil: WNL. TG 144 at baseline.  Neuro: Depression/anxiety  ID: Zosyn D#4 for MSSA UTI, abd post-op px. Afeb. WBC wnl. Bld cx neg so far.  Best Practices: Lovenox 40mg /d, IV PPI  TPN Access: PICC 1/6  TPN day#: 2  Nutritional Goals:  1400-1610 kCal, 62-72 grams of protein per day; 1.6 L fluid Goal: Clinimix 5/15 at 60 ml/hr and 20% fat emulsion at 10 ml/hr to provide 1502 kcal and 72 gm protein (meeting 100% estimated needs)  Plan:  1. Continue Clinimix E 5/15 at 37ml/hr and increase 20% fat emulsion to 10 ml/hr. Will advance to goal once glucose control improves. 2. Change MIVF to 1/2NS at 85 ml/hr.  3. Will f/u CBGs closely - Dr. Wylene Simmer and diabetes coordinator managing insulin pump in pt on TPN. No insulin will be added to TPN. If CBGs remain hard to control, may need to consider starting IV insulin gtt while pt on TPN. 4. F/u a.m. labs  **MD - using premade Clinimix for TPN, we are unable to change amount of electrolytes in bag - bag has 76mEq/L K and 15  mmol/L Phos; therefore, any additional electrolyte replacement will need to be made by outside of TPN**  Christoper Fabian, PharmD, BCPS Clinical pharmacist, pager [phone]  10/10/2013,8:55 AM

## 2013-10-10 NOTE — Progress Notes (Signed)
Subjective: Uneventful 24 hours, no flatus.  Sugars still a but high even with 40% increase in basal, boluses don't seem to pull it down as much.  No flatus yet.  Objective: Vital signs in last 24 hours: Temp:  [97.8 F (36.6 C)-98.2 F (36.8 C)] 97.8 F (36.6 C) (01/08 0548) Pulse Rate:  [72-84] 84 (01/08 0548) Resp:  [16-20] 16 (01/08 0548) BP: (100-114)/(44-57) 100/55 mmHg (01/08 0548) SpO2:  [95 %-96 %] 96 % (01/08 0548) Weight change:  Last BM Date: 10/02/13  Intake/Output from previous day: 01/07 0701 - 01/08 0700 In: 3961.6 [P.O.:240; I.V.:1439; NG/GT:350; IV Piggyback:810; TPN:1122.6] Out: 1250 [Urine:650; Emesis/NG output:600] Intake/Output this shift:   General appearance: alert, cooperative and appears stated age  Neck: no adenopathy, no carotid bruit, no JVD and thyroid not enlarged, symmetric, no tenderness/mass/nodules  Resp: clear to auscultation bilaterally  Cardio: regular rate and rhythm, S1, S2 normal, no murmur, click, rub or gallop  GI: incision dressed, mild tenderness  Extremities: extremities normal, atraumatic, no cyanosis or edema  Pulses: 2+ and symmetric  Lymph nodes: Cervical adenopathy: no cervical lymphadenopathy  Neurologic: Alert and oriented X 3, normal strength and tone. Normal symmetric reflexes.   Lab Results:  Recent Labs  10/07/13 1440 10/08/13 0530  WBC  --  9.5  HGB 12.6 11.9*  HCT 37.0 36.0  PLT  --  199   BMET  Recent Labs  10/09/13 1610 10/10/13 0515  NA 145 148*  K 3.8 3.3*  CL 110 110  CO2 24 28  GLUCOSE 234* 234*  BUN 10 9  CREATININE 0.53 0.51  CALCIUM 7.4* 7.5*    Studies/Results: No results found.  Medications:  I have reviewed the patient's current medications. Scheduled: . enoxaparin (LOVENOX) injection  40 mg Subcutaneous Q24H  . influenza vac split quadrivalent PF  0.5 mL Intramuscular Tomorrow-1000  . insulin pump   Subcutaneous Q4H  . pantoprazole (PROTONIX) IV  40 mg Intravenous QHS  .  piperacillin-tazobactam (ZOSYN)  IV  3.375 g Intravenous Q8H  . pneumococcal 23 valent vaccine  0.5 mL Intramuscular Tomorrow-1000  . potassium chloride  10 mEq Intravenous Q1 Hr x 4   Continuous: . Marland Kitchen.TPN (CLINIMIX-E) Adult 40 mL/hr at 10/09/13 1710   And  . fat emulsion 250 mL (10/09/13 1710)  . lactated ringers 50 mL/hr at 10/07/13 1303  . sodium chloride 0.9 % 1,000 mL infusion 85 mL/hr at 10/08/13 1800   UJW:JXBJYNWGPRN:dextrose, diphenhydrAMINE, diphenhydrAMINE, morphine injection, ondansetron, phenol, sodium chloride  Assessment/Plan: Principal Problem:  SBO (small bowel obstruction)  S/P lysis of adhesions yesterday per general surgery. Discussed NG suction and need to move around and wait for flatus before refeeding. Per surgical protocol. Pain also controlled.  DM Type 1, Continue pump, now with 24 hour temp basal of 160% Will have testing q2hours until CBG <200, then resume q4h. She has bolused for her 290 this AM already.. DO NOT ADD INSULIN TO TPN!  She will also add 1 unit to every correction bolus to compensate as well. Deprtession/Anxiety: If more anxious, give IV benzodiazepine, but orals on hold  Pain control: Morphine per CCS She is using minimally. Had some mild hallucination, but better.  Incentive spirometer, using it hourly.  HTN: Diovan held, BP controlled. With fever, would not resume at this point. Also NPO.  Nutrition:TPN until flatus as above.  Hypokalemia- Will replace IV.  Was 3.3 this AM with glucose in 230's, so effectively lower.  Another 40 meq per PICC.  Per  notes, can't be added to TPN, so will adjust this way.  Likely loss from suction as well as renal somewhat from being off ARB for now.  Will continue to follow.   LOS: 4 days   TISOVEC,RICHARD W 10/10/2013, 8:39 AM

## 2013-10-10 NOTE — Progress Notes (Signed)
3 Days Post-Op  Subjective: No flatus, no n/v.  Tolerating ice chips.  Walking.  Had a rough day, wasn't taking pain meds, better today.  No dysuria.  No sob, cp.    Objective: Vital signs in last 24 hours: Temp:  [97.8 F (36.6 C)-98.2 F (36.8 C)] 97.8 F (36.6 C) (01/08 0548) Pulse Rate:  [72-84] 84 (01/08 0548) Resp:  [16-20] 16 (01/08 0548) BP: (100-114)/(44-57) 100/55 mmHg (01/08 0548) SpO2:  [95 %-96 %] 96 % (01/08 0548) Last BM Date: 10/02/13  Intake/Output from previous day: 01/07 0701 - 01/08 0700 In: 3961.6 [P.O.:240; I.V.:1439; NG/GT:350; IV Piggyback:810; TPN:1122.6] Out: 1250 [Urine:650; Emesis/NG output:600] Intake/Output this shift:   PE  General appearance: alert, cooperative and NAD.  Resp: clear to auscultation bilaterally  Cardio: regular rate and rhythm, S1, S2 normal, no murmur, click, rub or gallop  GI: hypoactive bowel sounds, abodmen is soft, appropriately tender. Midline incision is clean, staples intact, no erythema.    Lab Results:   Recent Labs  10/07/13 1440 10/08/13 0530  WBC  --  9.5  HGB 12.6 11.9*  HCT 37.0 36.0  PLT  --  199   BMET  Recent Labs  10/09/13 1610 10/10/13 0515  NA 145 148*  K 3.8 3.3*  CL 110 110  CO2 24 28  GLUCOSE 234* 234*  BUN 10 9  CREATININE 0.53 0.51  CALCIUM 7.4* 7.5*   PT/INR No results found for this basename: LABPROT, INR,  in the last 72 hours ABG  Recent Labs  10/07/13 1440  HCO3 28.4*    Studies/Results: No results found.  Anti-infectives: Anti-infectives   Start     Dose/Rate Route Frequency Ordered Stop   10/07/13 1815  piperacillin-tazobactam (ZOSYN) IVPB 3.375 g     3.375 g 12.5 mL/hr over 240 Minutes Intravenous Every 8 hours 10/07/13 1811     10/07/13 1600  cefTRIAXone (ROCEPHIN) 1 g in dextrose 5 % 50 mL IVPB  Status:  Discontinued     1 g 100 mL/hr over 30 Minutes Intravenous Every 24 hours 10/06/13 1816 10/07/13 1811   10/07/13 0900  [MAR Hold]  ceFAZolin (ANCEF) IVPB 1  g/50 mL premix     (On MAR Hold since 10/07/13 1201)   1 g 100 mL/hr over 30 Minutes Intravenous  Once 10/07/13 0849 10/07/13 1321   10/07/13 0000  cefTRIAXone (ROCEPHIN) 1 g in dextrose 5 % 50 mL IVPB  Status:  Discontinued     1 g 100 mL/hr over 30 Minutes Intravenous Every 24 hours 10/06/13 1816 10/06/13 1835   10/06/13 1515  cefTRIAXone (ROCEPHIN) 1 g in dextrose 5 % 50 mL IVPB     1 g 100 mL/hr over 30 Minutes Intravenous  Once 10/06/13 1509 10/06/13 1705      Assessment/Plan: Small bowel obstruction  S/p Exploratory laparotomy with LOA, ileocecectomy Dr. Derrell Lollingamirez 10/07/12  POD #3  -clamp NGT, ice chips -mobilize  -IS  -IV pain meds  -zosyn 10/07/13---> ?indication, ok to DC? T1DM  -CBGs are increased, insulin pump adjusted per IM, DM HTN  -stable  PCM  -TPN  UTI  Gastroparesis  -advance diet slowly  Hypokalemia  -supplement per pharmacy  VTE prophylaxis  -SCDs, lovenox   LOS: 4 days    Bonner PunaRIEBOCK, [facility] ANP-BC Pager [phone] 10/10/2013 8:38 AM

## 2013-10-10 NOTE — Progress Notes (Signed)
NG has been clamped for several hours - no nausea No flatus, but feeling some rumbling. Abdomen seems soft without distention.  Will leave clamped overnight. Probably d/c NG in am.  Wilmon ArmsMatthew K. Corliss Skainssuei, MD, [facility]  General/ Trauma Surgery  10/10/2013 2:52 PM

## 2013-10-11 LAB — URINALYSIS, ROUTINE W REFLEX MICROSCOPIC
BILIRUBIN URINE: NEGATIVE
Glucose, UA: NEGATIVE mg/dL
Hgb urine dipstick: NEGATIVE
KETONES UR: NEGATIVE mg/dL
Leukocytes, UA: NEGATIVE
Nitrite: NEGATIVE
PH: 7.5 (ref 5.0–8.0)
Protein, ur: NEGATIVE mg/dL
Specific Gravity, Urine: 1.004 — ABNORMAL LOW (ref 1.005–1.030)
Urobilinogen, UA: 0.2 mg/dL (ref 0.0–1.0)

## 2013-10-11 LAB — BASIC METABOLIC PANEL
BUN: 7 mg/dL (ref 6–23)
CO2: 29 meq/L (ref 19–32)
Calcium: 7.6 mg/dL — ABNORMAL LOW (ref 8.4–10.5)
Chloride: 102 mEq/L (ref 96–112)
Creatinine, Ser: 0.53 mg/dL (ref 0.50–1.10)
GFR calc Af Amer: >90 mL/min (ref >90)
GLUCOSE: 236 mg/dL — AB (ref 70–99)
POTASSIUM: 3.3 meq/L — AB (ref 3.7–5.3)
SODIUM: 140 meq/L (ref 137–147)

## 2013-10-11 LAB — GLUCOSE, CAPILLARY
GLUCOSE-CAPILLARY: 197 mg/dL — AB (ref 70–99)
GLUCOSE-CAPILLARY: 133 mg/dL — AB (ref 70–99)
GLUCOSE-CAPILLARY: 135 mg/dL — AB (ref 70–99)
Glucose-Capillary: 130 mg/dL — ABNORMAL HIGH (ref 70–99)
Glucose-Capillary: 189 mg/dL — ABNORMAL HIGH (ref 70–99)
Glucose-Capillary: 89 mg/dL (ref 70–99)

## 2013-10-11 MED ORDER — OXYCODONE-ACETAMINOPHEN 5-325 MG PO TABS
1.0000 | ORAL_TABLET | ORAL | Status: DC | PRN
  Administered 2013-10-12 (×3): 2 via ORAL
  Administered 2013-10-13: 1 via ORAL
  Filled 2013-10-11 (×4): qty 2
  Filled 2013-10-11: qty 1

## 2013-10-11 MED ORDER — POTASSIUM CHLORIDE 10 MEQ/50ML IV SOLN
10.0000 meq | INTRAVENOUS | Status: AC
  Administered 2013-10-11 (×3): 10 meq via INTRAVENOUS
  Filled 2013-10-11 (×4): qty 50

## 2013-10-11 MED ORDER — TRACE MINERALS CR-CU-F-FE-I-MN-MO-SE-ZN IV SOLN
INTRAVENOUS | Status: DC
  Administered 2013-10-11: 18:00:00 via INTRAVENOUS
  Filled 2013-10-11: qty 1000

## 2013-10-11 MED ORDER — INSULIN PUMP
SUBCUTANEOUS | Status: DC
  Administered 2013-10-12: 0.7 via SUBCUTANEOUS
  Administered 2013-10-13: 0.3 via SUBCUTANEOUS
  Filled 2013-10-11: qty 1

## 2013-10-11 MED ORDER — POLYETHYLENE GLYCOL 3350 17 G PO PACK
17.0000 g | PACK | Freq: Every day | ORAL | Status: DC
  Administered 2013-10-11 – 2013-10-13 (×3): 17 g via ORAL
  Filled 2013-10-11 (×3): qty 1

## 2013-10-11 MED ORDER — POTASSIUM CHLORIDE 10 MEQ/50ML IV SOLN
10.0000 meq | INTRAVENOUS | Status: DC
  Administered 2013-10-11: 10 meq via INTRAVENOUS
  Filled 2013-10-11 (×6): qty 50

## 2013-10-11 MED ORDER — ACETAMINOPHEN 325 MG PO TABS: 325.0000 mg | ORAL_TABLET | Freq: Four times a day (QID) | ORAL | Status: DC | PRN

## 2013-10-11 MED ORDER — FAT EMULSION 20 % IV EMUL
250.0000 mL | INTRAVENOUS | Status: DC
  Administered 2013-10-11: 250 mL via INTRAVENOUS
  Filled 2013-10-11: qty 250

## 2013-10-11 NOTE — Progress Notes (Addendum)
PARENTERAL NUTRITION CONSULT NOTE - Follow-up  Pharmacy Consult for TPN Indication: SBO  No Known Allergies  Patient Measurements: Height: 5\' 2"  (157.5 cm) Weight: 115 lb (52.164 kg) IBW/kg (Calculated) : 50.1  Usual Weight: 52.2 kg  Vital Signs: Temp: 97.5 F (36.4 C) (01/09 0630) Temp src: Oral (01/08 2202) BP: 136/83 mmHg (01/09 0630) Pulse Rate: 91 (01/09 0630) Intake/Output from previous day: 01/08 0701 - 01/09 0700 In: 2884.6 [I.V.:1666; IV Piggyback:100; TPN:1118.6] Out: 1350 [Urine:1350] Intake/Output from this shift:    Labs: No results found for this basename: WBC, HGB, HCT, PLT, APTT, INR,  in the last 72 hours   Recent Labs  10/09/13 0523 10/09/13 1610 10/10/13 0515 10/11/13 0420  NA 142 145 148* 140  K 2.9* 3.8 3.3* 3.3*  CL 105 110 110 102  CO2 28 24 28 29   GLUCOSE 263* 234* 234* 236*  BUN 11 10 9 7   CREATININE 0.55 0.53 0.51 0.53  CALCIUM 7.6* 7.4* 7.5* 7.6*  MG  --   --  2.1  --   PHOS 1.3*  --  2.3  --   PROT  --   --  4.9*  --   ALBUMIN  --   --  1.9*  --   AST  --   --  29  --   ALT  --   --  13  --   ALKPHOS  --   --  76  --   BILITOT  --   --  <0.2*  --    Estimated Creatinine Clearance: 58.4 ml/min (by C-G formula based on Cr of 0.53).    Recent Labs  10/10/13 2018 10/11/13 0002 10/11/13 0353  GLUCAP 174* 189* 197*   Insulin Requirements in the past 24 hours:  Insulin pump   Current Nutrition:  Clinimix E 5/15 at 30ml/hr providing 1018 kcal, 48 gm protein  Assessment: [name] admitted with severe abdominal pain, N/V x 5d after eating meat for the first time in 3 years. 1/4 CT consistent with small bowel obstruction with transition point in the terminal ileum. S/p exp lap, LOA, ileocecetomy 1/6.   GI: h/o diabetic gastroparesis. S/p exp lap, LOA, ileocecetomy 1/6 for SBO. NGT clamped; probably out today. No BM, flatus yet. MD suspects pt will be slow to recover GI function. Prealbumin 3.7 (low) at baseline.  Endo: Type 1  DM on insulin pump managed by patient. See note and recommendation by Dr. Wylene Simmer [phone]). CBGs remain high since TPN start - not surprising- MD adjusting basal rate of insulin pump. Per Dr. Wylene Simmer, DO NOT ADD INSULIN TO TPN!  Lytes: K 3.3- MD replaced. Phos, Mg wnl. Na better today with change to 1/2NS. Ca corrects to 9.3 (alb 2.2) although ionized Ca low 1.1 on 1/5.   Renal: Scr stable. UOP not charted accurately. Pt on 1/2 NS at 85 ml/hr.  Pulm: RA  Cards: h/o HTN. VSS.  Hepatobil: WNL. TG 144 at baseline.  Neuro: Depression/anxiety  ID: Zosyn D#5 for MSSA UTI, abd post-op px. Afeb. WBC wnl. Bld cx neg so far.  Best Practices: Lovenox 40mg /d, IV PPI  TPN Access: PICC 1/6  TPN day#: 3  Nutritional Goals:  1400-1610 kCal, 62-72 grams of protein per day; 1.6 L fluid Goal: Clinimix 5/15 at 60 ml/hr and 20% fat emulsion at 10 ml/hr to provide 1502 kcal and 72 gm protein (meeting 100% estimated needs)  Plan:  1. Continue Clinimix E 5/15 at 50ml/hr and 20% fat emulsion  at 10 ml/hr. Will advance to goal once glucose control improves. 2. Will f/u CBGs closely - Dr. Wylene Simmerisovec and diabetes coordinator managing insulin pump in pt on TPN. No insulin will be added to TPN. If CBGs remain hard to control, may need to consider starting IV insulin gtt while pt on TPN. 4. F/u a.m. BMET, Mg, Phos  **MD - using premade Clinimix for TPN, we are unable to change amount of electrolytes in bag - bag has 8930mEq/L K and 15 mmol/L Phos; therefore, any additional electrolyte replacement will need to be made by outside of TPN**  Christoper Fabianaron Katelen Luepke, PharmD, BCPS Clinical pharmacist, pager [phone]  10/11/2013,7:56 AM

## 2013-10-11 NOTE — Progress Notes (Signed)
Patient ID: [facility], female   DOB: Nov 10, 1950, 63 y.o.   MRN: [id number]  Subjective: Feels better today, lots of "rumbling" she states.  Tolerated NGT clamp x24h.  Ambulating in hallways.  No dysuria.    Objective:  Vital signs:  Filed Vitals:   10/10/13 0548 10/10/13 1456 10/10/13 2202 10/11/13 0630  BP: 100/55 127/60 135/56 136/83  Pulse: 84 73 83 91  Temp: 97.8 F (36.6 C) 97.9 F (36.6 C) 98 F (36.7 C) 97.5 F (36.4 C)  TempSrc:  Oral Oral   Resp: $Remo'16 18 17 17  'XBzFe$ Height:      Weight:      SpO2: 96% 94% 94% 96%    Last BM Date: 10/06/13  Intake/Output   Yesterday:  01/08 0701 - 01/09 0700 In: 2884.6 [I.V.:1666; IV Piggyback:100; TPN:1118.6] Out: 1350 [Urine:1350]   Physical Exam: General appearance: alert, cooperative and NAD.  Resp: clear to auscultation bilaterally  Cardio: regular rate and rhythm, S1, S2 normal, no murmur, click, rub or gallop  GI: normal bowel sounds, abodmen is soft, appropriately tender. Midline incision is clean, staples intact, no erythema.   Problem List:   Principal Problem:   SBO (small bowel obstruction)    Results:   Labs: Results for orders placed during the hospital encounter of 10/06/13 (from the past 48 hour(s))  GLUCOSE, CAPILLARY     Status: Abnormal   Collection Time    10/09/13 12:44 PM      Result Value Range   Glucose-Capillary 172 (*) 70 - 99 mg/dL  BASIC METABOLIC PANEL     Status: Abnormal   Collection Time    10/09/13  4:10 PM      Result Value Range   Sodium 145  137 - 147 mEq/L   Potassium 3.8  3.7 - 5.3 mEq/L   Comment: NO VISIBLE HEMOLYSIS   Chloride 110  96 - 112 mEq/L   CO2 24  19 - 32 mEq/L   Glucose, Bld 234 (*) 70 - 99 mg/dL   BUN 10  6 - 23 mg/dL   Creatinine, Ser 0.53  0.50 - 1.10 mg/dL   Calcium 7.4 (*) 8.4 - 10.5 mg/dL   GFR calc non Af Amer >90  >90 mL/min   GFR calc Af Amer >90  >90 mL/min   Comment: (NOTE)     The eGFR has been calculated using the CKD EPI equation.     This  calculation has not been validated in all clinical situations.     eGFR's persistently <90 mL/min signify possible Chronic Kidney     Disease.  GLUCOSE, CAPILLARY     Status: Abnormal   Collection Time    10/09/13  4:34 PM      Result Value Range   Glucose-Capillary 224 (*) 70 - 99 mg/dL   Comment 1 Documented in Chart     Comment 2 Notify RN    GLUCOSE, CAPILLARY     Status: Abnormal   Collection Time    10/09/13  8:13 PM      Result Value Range   Glucose-Capillary 191 (*) 70 - 99 mg/dL  GLUCOSE, CAPILLARY     Status: Abnormal   Collection Time    10/09/13 11:50 PM      Result Value Range   Glucose-Capillary 213 (*) 70 - 99 mg/dL  GLUCOSE, CAPILLARY     Status: Abnormal   Collection Time    10/10/13  4:04 AM      Result Value  Range   Glucose-Capillary 238 (*) 70 - 99 mg/dL  COMPREHENSIVE METABOLIC PANEL     Status: Abnormal   Collection Time    10/10/13  5:15 AM      Result Value Range   Sodium 148 (*) 137 - 147 mEq/L   Potassium 3.3 (*) 3.7 - 5.3 mEq/L   Chloride 110  96 - 112 mEq/L   CO2 28  19 - 32 mEq/L   Glucose, Bld 234 (*) 70 - 99 mg/dL   BUN 9  6 - 23 mg/dL   Creatinine, Ser 0.51  0.50 - 1.10 mg/dL   Calcium 7.5 (*) 8.4 - 10.5 mg/dL   Total Protein 4.9 (*) 6.0 - 8.3 g/dL   Albumin 1.9 (*) 3.5 - 5.2 g/dL   AST 29  0 - 37 U/L   ALT 13  0 - 35 U/L   Alkaline Phosphatase 76  39 - 117 U/L   Total Bilirubin <0.2 (*) 0.3 - 1.2 mg/dL   GFR calc non Af Amer >90  >90 mL/min   GFR calc Af Amer >90  >90 mL/min   Comment: (NOTE)     The eGFR has been calculated using the CKD EPI equation.     This calculation has not been validated in all clinical situations.     eGFR's persistently <90 mL/min signify possible Chronic Kidney     Disease.  MAGNESIUM     Status: None   Collection Time    10/10/13  5:15 AM      Result Value Range   Magnesium 2.1  1.5 - 2.5 mg/dL  PHOSPHORUS     Status: None   Collection Time    10/10/13  5:15 AM      Result Value Range   Phosphorus  2.3  2.3 - 4.6 mg/dL  GLUCOSE, CAPILLARY     Status: Abnormal   Collection Time    10/10/13  8:09 AM      Result Value Range   Glucose-Capillary 144 (*) 70 - 99 mg/dL  GLUCOSE, CAPILLARY     Status: Abnormal   Collection Time    10/10/13 12:38 PM      Result Value Range   Glucose-Capillary 173 (*) 70 - 99 mg/dL   Comment 1 Notify RN    GLUCOSE, CAPILLARY     Status: Abnormal   Collection Time    10/10/13  4:03 PM      Result Value Range   Glucose-Capillary 185 (*) 70 - 99 mg/dL   Comment 1 Notify RN    GLUCOSE, CAPILLARY     Status: Abnormal   Collection Time    10/10/13  8:18 PM      Result Value Range   Glucose-Capillary 174 (*) 70 - 99 mg/dL   Comment 1 Notify RN     Comment 2 Documented in Chart    GLUCOSE, CAPILLARY     Status: Abnormal   Collection Time    10/11/13 12:02 AM      Result Value Range   Glucose-Capillary 189 (*) 70 - 99 mg/dL   Comment 1 Documented in Chart     Comment 2 Notify RN    GLUCOSE, CAPILLARY     Status: Abnormal   Collection Time    10/11/13  3:53 AM      Result Value Range   Glucose-Capillary 197 (*) 70 - 99 mg/dL   Comment 1 Notify RN     Comment 2 Documented in Chart  BASIC METABOLIC PANEL     Status: Abnormal   Collection Time    10/11/13  4:20 AM      Result Value Range   Sodium 140  137 - 147 mEq/L   Potassium 3.3 (*) 3.7 - 5.3 mEq/L   Chloride 102  96 - 112 mEq/L   CO2 29  19 - 32 mEq/L   Glucose, Bld 236 (*) 70 - 99 mg/dL   BUN 7  6 - 23 mg/dL   Creatinine, Ser 0.53  0.50 - 1.10 mg/dL   Calcium 7.6 (*) 8.4 - 10.5 mg/dL   GFR calc non Af Amer >90  >90 mL/min   GFR calc Af Amer >90  >90 mL/min   Comment: (NOTE)     The eGFR has been calculated using the CKD EPI equation.     This calculation has not been validated in all clinical situations.     eGFR's persistently <90 mL/min signify possible Chronic Kidney     Disease.  GLUCOSE, CAPILLARY     Status: Abnormal   Collection Time    10/11/13  8:40 AM      Result Value  Range   Glucose-Capillary 133 (*) 70 - 99 mg/dL   Comment 1 Notify RN      Imaging / Studies: No results found.  Medications / Allergies: per chart  Antibiotics: Anti-infectives   Start     Dose/Rate Route Frequency Ordered Stop   10/07/13 1815  piperacillin-tazobactam (ZOSYN) IVPB 3.375 g     3.375 g 12.5 mL/hr over 240 Minutes Intravenous Every 8 hours 10/07/13 1811     10/07/13 1600  cefTRIAXone (ROCEPHIN) 1 g in dextrose 5 % 50 mL IVPB  Status:  Discontinued     1 g 100 mL/hr over 30 Minutes Intravenous Every 24 hours 10/06/13 1816 10/07/13 1811   10/07/13 0900  [MAR Hold]  ceFAZolin (ANCEF) IVPB 1 g/50 mL premix     (On MAR Hold since 10/07/13 1201)   1 g 100 mL/hr over 30 Minutes Intravenous  Once 10/07/13 0849 10/07/13 1321   10/07/13 0000  cefTRIAXone (ROCEPHIN) 1 g in dextrose 5 % 50 mL IVPB  Status:  Discontinued     1 g 100 mL/hr over 30 Minutes Intravenous Every 24 hours 10/06/13 1816 10/06/13 1835   10/06/13 1515  cefTRIAXone (ROCEPHIN) 1 g in dextrose 5 % 50 mL IVPB     1 g 100 mL/hr over 30 Minutes Intravenous  Once 10/06/13 1509 10/06/13 1705       Assessment/Plan:  Small bowel obstruction  S/p Exploratory laparotomy with LOA, ileocecectomy Dr. Rosendo Gros 10/07/12  POD #4 -NGT clamped since yesterday morning, DC and start sips of clears today. -mobilize  -IS  -IV pain meds, add oral pain meds  -miralax -zosyn 10/07/13---> ?indication, ok to DC?  T1DM  -CBGs are increased, insulin pump adjusted per IM, DM  HTN  -stable  PCM  -TPN. Wean when she is tolerating orals adequately.  UTI-resistant to PCN, received 1 dose of rocephin.  Repeat UA to ensure resolution.  Gastroparesis  -advance diet slowly  Hypokalemia  -supplement per pharmacy  VTE prophylaxis  -SCDs, lovenox   Erby Pian, [facility] Pager [phone] Office [phone]  10/11/2013  9:15 AM

## 2013-10-11 NOTE — Progress Notes (Signed)
Subjective: Has had some cramping pain, but no flatulence yet.  Getting OOB and walking.  Sugars mid to higher 100's, >200 again this AM (due to dawn phenomenon)  Objective: Vital signs in last 24 hours: Temp:  [97.5 F (36.4 C)-98 F (36.7 C)] 97.5 F (36.4 C) (01/09 0630) Pulse Rate:  [73-91] 91 (01/09 0630) Resp:  [17-18] 17 (01/09 0630) BP: (127-136)/(56-83) 136/83 mmHg (01/09 0630) SpO2:  [94 %-96 %] 96 % (01/09 0630) Weight change:  Last BM Date: 10/06/13  Intake/Output from previous day: 01/08 0701 - 01/09 0700 In: 2884.6 [I.V.:1666; IV Piggyback:100; TPN:1118.6] Out: 1350 [Urine:1350] Intake/Output this shift:   General appearance: alert, cooperative and appears stated age  Neck: no adenopathy, no carotid bruit, no JVD and thyroid not enlarged, symmetric, no tenderness/mass/nodules  Resp: clear to auscultation bilaterally  Cardio: regular rate and rhythm, S1, S2 normal, no murmur, click, rub or gallop  GI: incision dressed, mild tenderness  Extremities: extremities normal, atraumatic, no cyanosis or edema  Pulses: 2+ and symmetric  Lymph nodes: Cervical adenopathy: no cervical lymphadenopathy  Neurologic: Alert and oriented X 3, normal strength and tone. Normal symmetric reflexes.    Lab Results: No results found for this basename: WBC, HGB, HCT, PLT,  in the last 72 hours BMET  Recent Labs  10/10/13 0515 10/11/13 0420  NA 148* 140  K 3.3* 3.3*  CL 110 102  CO2 28 29  GLUCOSE 234* 236*  BUN 9 7  CREATININE 0.51 0.53  CALCIUM 7.5* 7.6*    Studies/Results: No results found.  Medications:  I have reviewed the patient's current medications. Scheduled: . enoxaparin (LOVENOX) injection  40 mg Subcutaneous Q24H  . influenza vac split quadrivalent PF  0.5 mL Intramuscular Tomorrow-1000  . insulin pump   Subcutaneous Q4H  . pantoprazole (PROTONIX) IV  40 mg Intravenous QHS  . piperacillin-tazobactam (ZOSYN)  IV  3.375 g Intravenous Q8H  . pneumococcal 23  valent vaccine  0.5 mL Intramuscular Tomorrow-1000  . potassium chloride  10 mEq Intravenous Q1 Hr x 6  . potassium chloride  10 mEq Intravenous Q1 Hr x 4   Continuous: . sodium chloride 85 mL/hr at 10/10/13 2325  . Marland Kitchen.TPN (CLINIMIX-E) Adult 40 mL/hr at 10/10/13 1712   And  . fat emulsion 250 mL (10/10/13 1712)   ZOX:[id number]:dextrose, diphenhydrAMINE, diphenhydrAMINE, morphine injection, ondansetron, phenol, sodium chloride  Assessment/Plan: Principal Problem:  SBO (small bowel obstruction)  S/P lysis of adhesions per general surgery.Likely removing NG today, possibly clears/sips. Per surgical protocol. Pain also controlled.  DM Type 1, Continue pump, now with 24 hour temp basal of 170% Will have testing q4h. DO NOT ADD INSULIN TO TPN!  Deprtession/Anxiety: If more anxious, give IV benzodiazepine, but orals on hold  Pain control: Morphine per CCS She is using minimally.No hallucinations since first postop day. Incentive spirometer, using it hourly.  HTN: Diovan held, BP controlled. With fever, would not resume at this point. Also NPO.  Nutrition:TPN until flatus as above.  Hypokalemia- Will replace IV another 40 meq today/.  One of my partners will be looking in on her this weekend.  She was instructed to maintain the 170% temp basal until TPN d/ced and will reset herself if not seen before the temp expires tomorrow at 8:11am, we reviewed how to do this (she is an experienced pump user)  LOS: 5 days   TISOVEC,RICHARD W 10/11/2013, 8:17 AM

## 2013-10-11 NOTE — Progress Notes (Signed)
Good bowel sounds NG removed Hopefully will regain bowel function today. Ambulate  Wilmon ArmsMatthew K. Corliss Skainssuei, MD, [facility]  General/ Trauma Surgery  10/11/2013 12:01 PM

## 2013-10-12 LAB — GLUCOSE, CAPILLARY
Glucose-Capillary: 100 mg/dL — ABNORMAL HIGH (ref 70–99)
Glucose-Capillary: 130 mg/dL — ABNORMAL HIGH (ref 70–99)
Glucose-Capillary: 168 mg/dL — ABNORMAL HIGH (ref 70–99)
Glucose-Capillary: 213 mg/dL — ABNORMAL HIGH (ref 70–99)
Glucose-Capillary: 71 mg/dL (ref 70–99)

## 2013-10-12 LAB — BASIC METABOLIC PANEL
BUN: 6 mg/dL (ref 6–23)
CO2: 29 mEq/L (ref 19–32)
CREATININE: 0.49 mg/dL — AB (ref 0.50–1.10)
Calcium: 8.1 mg/dL — ABNORMAL LOW (ref 8.4–10.5)
Chloride: 98 mEq/L (ref 96–112)
GFR calc Af Amer: >90 mL/min (ref >90)
GLUCOSE: 179 mg/dL — AB (ref 70–99)
POTASSIUM: 3.6 meq/L — AB (ref 3.7–5.3)
Sodium: 139 mEq/L (ref 137–147)

## 2013-10-12 LAB — MAGNESIUM: Magnesium: 1.9 mg/dL (ref 1.5–2.5)

## 2013-10-12 LAB — PHOSPHORUS: Phosphorus: 3.5 mg/dL (ref 2.3–4.6)

## 2013-10-12 MED ORDER — TRAMADOL HCL 50 MG PO TABS
50.0000 mg | ORAL_TABLET | Freq: Four times a day (QID) | ORAL | Status: DC | PRN
  Administered 2013-10-13 (×3): 50 mg via ORAL
  Filled 2013-10-12 (×3): qty 1

## 2013-10-12 NOTE — Progress Notes (Signed)
Patient interviewed and examined, agree with PA note above.  Mariella SaaBenjamin T Naira Standiford MD, FACS  10/12/2013 11:19 AM

## 2013-10-12 NOTE — Progress Notes (Signed)
PARENTERAL NUTRITION CONSULT NOTE - FOLLOW UP  Pharmacy Consult for TPN Indication: SBO  No Known Allergies  Patient Measurements: Height: 5\' 2"  (157.5 cm) Weight: 115 lb (52.164 kg) IBW/kg (Calculated) : 50.1 Adjusted Body Weight:  Usual Weight:   Vital Signs: Temp: 97.8 F (36.6 C) (01/10 0520) Temp src: Oral (01/10 0520) BP: 144/61 mmHg (01/10 0520) Pulse Rate: 82 (01/10 0520) Intake/Output from previous day: 01/09 0701 - 01/10 0700 In: 3622.7 [P.O.:120; I.V.:2064.5; IV Piggyback:200; TPN:1238.3] Out: 1700 [Urine:1700] Intake/Output from this shift: Total I/O In: 20 [I.V.:20] Out: -   Labs: No results found for this basename: WBC, HGB, HCT, PLT, APTT, INR,  in the last 72 hours   Recent Labs  10/10/13 0515 10/11/13 0420 10/12/13 0500  NA 148* 140 139  K 3.3* 3.3* 3.6*  CL 110 102 98  CO2 28 29 29   GLUCOSE 234* 236* 179*  BUN 9 7 6   CREATININE 0.51 0.53 0.49*  CALCIUM 7.5* 7.6* 8.1*  MG 2.1  --  1.9  PHOS 2.3  --  3.5  PROT 4.9*  --   --   ALBUMIN 1.9*  --   --   AST 29  --   --   ALT 13  --   --   ALKPHOS 76  --   --   BILITOT <0.2*  --   --    Estimated Creatinine Clearance: 58.4 ml/min (by C-G formula based on Cr of 0.49).    Recent Labs  10/12/13 0007 10/12/13 0752 10/12/13 1214  GLUCAP 213* 71 130*    Medications:  Scheduled:  . enoxaparin (LOVENOX) injection  40 mg Subcutaneous Q24H  . influenza vac split quadrivalent PF  0.5 mL Intramuscular Tomorrow-1000  . insulin pump   Subcutaneous Q4H  . pantoprazole (PROTONIX) IV  40 mg Intravenous QHS  . pneumococcal 23 valent vaccine  0.5 mL Intramuscular Tomorrow-1000  . polyethylene glycol  17 g Oral Daily    Insulin Requirements in the past 24 hours:  Insulin pump  Current Nutrition:  Full liquids Clinimix E 5/15 at 4840ml/hr and Fat emulsion 20% at 1010ml/hr  Assessment: [name] admitted with severe abd pain, N/V x 5d after eating meat for the first time in 3 yr. 1/4 CT consistent  with SBO with transition point in the terminal ileum. S/p exp lap, LOA, ileocecetomy 1/6.   GI: h/o diabetic gastroparesis. S/p exp lap, LOA, ileocecetomy 1/6 for SBO. NGT clamped; probably out today. BM x 5 since yest per MD note (1x per, tolerating CLs- adv to FLs, ambulating. (+)BS, abd soft, appropriately tender. Prealbumin 3.7 (low) at baseline. Spoke with PA- wean to off today.  Endo: Type 1 DM on insulin pump managed by pt. See note and recs by Dr. Wylene Simmerisovec [phone]). CBGs remain high since TPN start - not surprising- MD adjusting basal rate of insulin pump. Per Dr. Wylene Simmerisovec, DO NOT ADD INSULIN TO TPN!  Lytes: K 3.6. Phos, Mg wnl. Na better today with change to 1/2NS. Ca corrects to 9.3 (alb 2.2) although ionized Ca low 1.1 on 1/5.   Renal: Scr stable. UOP not charted accurately. Pt on 1/2 NS at 85 ml/hr.  Pulm: RA  Cards: h/o HTN. VSS.  Hepatobil: WNL. TG 144 at baseline.  Neuro: Depression/anxiety  ID: Zosyn D#5 for MSSA UTI, abd post-op px. Afeb. WBC wnl. Bld cx neg so far.  Best Practices: Lovenox 40mg /d, IV PPI  TPN Access: PICC 1/6  TPN day#: 3  Nutritional  Goals:  1400-1610 kCal, 62-72 grams of protein per day; 1.6 L fluid Goal: Clinimix 5/15 at 60 ml/hr and 20% fat emulsion at 10 ml/hr to provide 1502 kcal and 72 gm protein (meeting 100% estimated needs)  Plan:  1. Decrease TPN to half rate and d/c 2. DC TPN labs  Marisue Humble, PharmD Clinical Pharmacist [facility]

## 2013-10-12 NOTE — Progress Notes (Signed)
Patient ID: Taylor Adams [facility], female   DOB: Apr 15, 1951, 63 y.o.   MRN: [id number]   Subjective: 3 BMs last night 2BMs this morning. Tolerated clears.  Ambulating in hallways.   Objective:  Vital signs:  Filed Vitals:   10/11/13 0630 10/11/13 1417 10/11/13 2022 10/12/13 0520  BP: 136/83 137/81 140/65 144/61  Pulse: 91 90 85 82  Temp: 97.5 F (36.4 C) 97.8 F (36.6 C) 98.5 F (36.9 C) 97.8 F (36.6 C)  TempSrc:  Oral Oral Oral  Resp: $Remo'17 18 18 16  'TzETm$ Height:      Weight:      SpO2: 96% 96% 95% 97%    Last BM Date: 10/11/13  Intake/Output   Yesterday:  01/09 0701 - 01/10 0700 In: 3622.7 [P.O.:120; I.V.:2064.5; IV Piggyback:200; TPN:1238.3] Out: 1700 [Urine:1700]     Physical Exam:  General appearance: alert, cooperative and NAD.  Resp: clear to auscultation bilaterally  Cardio: regular rate and rhythm, S1, S2 normal, no murmur, click, rub or gallop  GI: normal bowel sounds, abodmen is soft, appropriately tender. Midline incision is clean, staples intact, no erythema.    Problem List:   Principal Problem:   SBO (small bowel obstruction)    Results:   Labs: Results for orders placed during the hospital encounter of 10/06/13 (from the past 48 hour(s))  GLUCOSE, CAPILLARY     Status: Abnormal   Collection Time    10/10/13 12:38 PM      Result Value Range   Glucose-Capillary 173 (*) 70 - 99 mg/dL   Comment 1 Notify RN    GLUCOSE, CAPILLARY     Status: Abnormal   Collection Time    10/10/13  4:03 PM      Result Value Range   Glucose-Capillary 185 (*) 70 - 99 mg/dL   Comment 1 Notify RN    GLUCOSE, CAPILLARY     Status: Abnormal   Collection Time    10/10/13  8:18 PM      Result Value Range   Glucose-Capillary 174 (*) 70 - 99 mg/dL   Comment 1 Notify RN     Comment 2 Documented in Chart    GLUCOSE, CAPILLARY     Status: Abnormal   Collection Time    10/11/13 12:02 AM      Result Value Range   Glucose-Capillary 189 (*) 70 - 99 mg/dL   Comment 1 Documented  in Chart     Comment 2 Notify RN    GLUCOSE, CAPILLARY     Status: Abnormal   Collection Time    10/11/13  3:53 AM      Result Value Range   Glucose-Capillary 197 (*) 70 - 99 mg/dL   Comment 1 Notify RN     Comment 2 Documented in Chart    BASIC METABOLIC PANEL     Status: Abnormal   Collection Time    10/11/13  4:20 AM      Result Value Range   Sodium 140  137 - 147 mEq/L   Potassium 3.3 (*) 3.7 - 5.3 mEq/L   Chloride 102  96 - 112 mEq/L   CO2 29  19 - 32 mEq/L   Glucose, Bld 236 (*) 70 - 99 mg/dL   BUN 7  6 - 23 mg/dL   Creatinine, Ser 0.53  0.50 - 1.10 mg/dL   Calcium 7.6 (*) 8.4 - 10.5 mg/dL   GFR calc non Af Amer >90  >90 mL/min   GFR calc Af Amer >90  >  90 mL/min   Comment: (NOTE)     The eGFR has been calculated using the CKD EPI equation.     This calculation has not been validated in all clinical situations.     eGFR's persistently <90 mL/min signify possible Chronic Kidney     Disease.  GLUCOSE, CAPILLARY     Status: Abnormal   Collection Time    10/11/13  8:40 AM      Result Value Range   Glucose-Capillary 133 (*) 70 - 99 mg/dL   Comment 1 Notify RN    GLUCOSE, CAPILLARY     Status: None   Collection Time    10/11/13 11:57 AM      Result Value Range   Glucose-Capillary 89  70 - 99 mg/dL  URINALYSIS, ROUTINE W REFLEX MICROSCOPIC     Status: Abnormal   Collection Time    10/11/13  3:48 PM      Result Value Range   Color, Urine YELLOW  YELLOW   APPearance CLEAR  CLEAR   Specific Gravity, Urine 1.004 (*) 1.005 - 1.030   pH 7.5  5.0 - 8.0   Glucose, UA NEGATIVE  NEGATIVE mg/dL   Hgb urine dipstick NEGATIVE  NEGATIVE   Bilirubin Urine NEGATIVE  NEGATIVE   Ketones, ur NEGATIVE  NEGATIVE mg/dL   Protein, ur NEGATIVE  NEGATIVE mg/dL   Urobilinogen, UA 0.2  0.0 - 1.0 mg/dL   Nitrite NEGATIVE  NEGATIVE   Leukocytes, UA NEGATIVE  NEGATIVE   Comment: MICROSCOPIC NOT DONE ON URINES WITH NEGATIVE PROTEIN, BLOOD, LEUKOCYTES, NITRITE, OR GLUCOSE <1000 mg/dL.  GLUCOSE,  CAPILLARY     Status: Abnormal   Collection Time    10/11/13  4:59 PM      Result Value Range   Glucose-Capillary 130 (*) 70 - 99 mg/dL  GLUCOSE, CAPILLARY     Status: Abnormal   Collection Time    10/11/13  8:25 PM      Result Value Range   Glucose-Capillary 135 (*) 70 - 99 mg/dL  GLUCOSE, CAPILLARY     Status: Abnormal   Collection Time    10/12/13 12:07 AM      Result Value Range   Glucose-Capillary 213 (*) 70 - 99 mg/dL  BASIC METABOLIC PANEL     Status: Abnormal   Collection Time    10/12/13  5:00 AM      Result Value Range   Sodium 139  137 - 147 mEq/L   Potassium 3.6 (*) 3.7 - 5.3 mEq/L   Chloride 98  96 - 112 mEq/L   CO2 29  19 - 32 mEq/L   Glucose, Bld 179 (*) 70 - 99 mg/dL   BUN 6  6 - 23 mg/dL   Creatinine, Ser 0.49 (*) 0.50 - 1.10 mg/dL   Calcium 8.1 (*) 8.4 - 10.5 mg/dL   GFR calc non Af Amer >90  >90 mL/min   GFR calc Af Amer >90  >90 mL/min   Comment: (NOTE)     The eGFR has been calculated using the CKD EPI equation.     This calculation has not been validated in all clinical situations.     eGFR's persistently <90 mL/min signify possible Chronic Kidney     Disease.  MAGNESIUM     Status: None   Collection Time    10/12/13  5:00 AM      Result Value Range   Magnesium 1.9  1.5 - 2.5 mg/dL  PHOSPHORUS  Status: None   Collection Time    10/12/13  5:00 AM      Result Value Range   Phosphorus 3.5  2.3 - 4.6 mg/dL  GLUCOSE, CAPILLARY     Status: None   Collection Time    10/12/13  7:52 AM      Result Value Range   Glucose-Capillary 71  70 - 99 mg/dL    Imaging / Studies: No results found.  Medications / Allergies: per chart  Antibiotics: Anti-infectives   Start     Dose/Rate Route Frequency Ordered Stop   10/07/13 1815  piperacillin-tazobactam (ZOSYN) IVPB 3.375 g  Status:  Discontinued     3.375 g 12.5 mL/hr over 240 Minutes Intravenous Every 8 hours 10/07/13 1811 10/11/13 0951   10/07/13 1600  cefTRIAXone (ROCEPHIN) 1 g in dextrose 5 % 50  mL IVPB  Status:  Discontinued     1 g 100 mL/hr over 30 Minutes Intravenous Every 24 hours 10/06/13 1816 10/07/13 1811   10/07/13 0900  [MAR Hold]  ceFAZolin (ANCEF) IVPB 1 g/50 mL premix     (On MAR Hold since 10/07/13 1201)   1 g 100 mL/hr over 30 Minutes Intravenous  Once 10/07/13 0849 10/07/13 1321   10/07/13 0000  cefTRIAXone (ROCEPHIN) 1 g in dextrose 5 % 50 mL IVPB  Status:  Discontinued     1 g 100 mL/hr over 30 Minutes Intravenous Every 24 hours 10/06/13 1816 10/06/13 1835   10/06/13 1515  cefTRIAXone (ROCEPHIN) 1 g in dextrose 5 % 50 mL IVPB     1 g 100 mL/hr over 30 Minutes Intravenous  Once 10/06/13 1509 10/06/13 1705       Assessment/Plan:  Small bowel obstruction  S/p Exploratory laparotomy with LOA, ileocecectomy Dr. Rosendo Gros 10/07/12  POD #5  -had multiple BMs, advance to full liquid diet -mobilize  -IS  -IV pain meds, add oral pain meds  -miralax  T1DM  -CBGs have improved, insulin pump adjusted per IM, DM  HTN  -stable  PCM  -TPN. Wean off TPN  UTI-resolved Gastroparesis  -advance diet slowly  Hypokalemia  -supplement per pharmacy  VTE prophylaxis  -SCDs, lovenox  Erby Pian, [facility] Surgery Pager [phone] Office [phone]  10/12/2013 10:47 AM

## 2013-10-12 NOTE — Progress Notes (Signed)
Subjective: NG out.  Getting OOB and walking.  Sugars 71-213 - Pump and basals discussed. Having BMS. Clears started.   Objective: Vital signs in last 24 hours: Temp:  [97.8 F (36.6 C)-98.5 F (36.9 C)] 97.8 F (36.6 C) (01/10 0520) Pulse Rate:  [82-90] 82 (01/10 0520) Resp:  [16-18] 16 (01/10 0520) BP: (137-144)/(61-81) 144/61 mmHg (01/10 0520) SpO2:  [95 %-97 %] 97 % (01/10 0520) Weight change:  Last BM Date: 10/11/13  Intake/Output from previous day: 01/09 0701 - 01/10 0700 In: 3622.7 [P.O.:120; I.V.:2064.5; IV Piggyback:200; TPN:1238.3] Out: 1700 [Urine:1700] Intake/Output this shift:   General appearance: alert, cooperative and appears stated age  Resp: clear to auscultation bilaterally  Cardio: regular rate and rhythm, S1, S2 normal, no murmur, click, rub or gallop  GI: incision dressed, mild tenderness  Extremities: extremities normal, atraumatic, no cyanosis or edema  Neurologic: Alert and oriented X 3, normal strength and tone. Normal symmetric reflexes.    Lab Results: No results found for this basename: WBC, HGB, HCT, PLT,  in the last 72 hours BMET  Recent Labs  10/11/13 0420 10/12/13 0500  NA 140 139  K 3.3* 3.6*  CL 102 98  CO2 29 29  GLUCOSE 236* 179*  BUN 7 6  CREATININE 0.53 0.49*  CALCIUM 7.6* 8.1*    Studies/Results: No results found.  Medications:  I have reviewed the patient's current medications. Scheduled: . enoxaparin (LOVENOX) injection  40 mg Subcutaneous Q24H  . influenza vac split quadrivalent PF  0.5 mL Intramuscular Tomorrow-1000  . insulin pump   Subcutaneous Q4H  . pantoprazole (PROTONIX) IV  40 mg Intravenous QHS  . pneumococcal 23 valent vaccine  0.5 mL Intramuscular Tomorrow-1000  . polyethylene glycol  17 g Oral Daily   Continuous: . sodium chloride 85 mL/hr at 10/12/13 0349  . Marland Kitchen.TPN (CLINIMIX-E) Adult 40 mL/hr at 10/11/13 1827   And  . fat emulsion 250 mL (10/11/13 1827)   AVW:[id number]:acetaminophen, dextrose,  diphenhydrAMINE, diphenhydrAMINE, morphine injection, ondansetron, oxyCODONE-acetaminophen, phenol, sodium chloride  Assessment/Plan: SBO (small bowel obstruction) - S/P lysis of adhesions per general surgery.  NG today out.  Clears to be startred.  TNA to be stopped tomorrow. Pain also controlled.   DM Type 1, Continue pump. CBGs expected to go up c orals and TNA.  Will leave testing q4h. DO NOT ADD INSULIN TO TPN! Due to CBGs 71-200 watch for increasing Insulin too fast. 12 am 0.4 - leave alone 4am 0.65 - increase to 0.7 9:30 am 0.4 - increase to 0.6 11 am 0.35 - increase to 0.40 3 pm 0.4 - increase to 0.5 8 pm 0.3 - leave alone  Will probably need more. Bolus as needed and we will adjust as needed.  Deprtession/Anxiety: Anxiety and mood fine today Pain control: Morphine per [facility] She is using minimally.No hallucinations.  Hopefully on PO narcs tomorrow and home tom night or Monday. Incentive spirometer, using it hourly.  HTN: Diovan held, BP controlled. Restart Diovan tom or Monday  Nutrition:TPN until tom and stop as scheduled.  Hypokalemia- At 3.6 she is better.   LOS: 6 days   Taylor Adams 10/12/2013, 10:17 AM

## 2013-10-13 LAB — GLUCOSE, CAPILLARY
GLUCOSE-CAPILLARY: 157 mg/dL — AB (ref 70–99)
GLUCOSE-CAPILLARY: 97 mg/dL (ref 70–99)
Glucose-Capillary: 140 mg/dL — ABNORMAL HIGH (ref 70–99)
Glucose-Capillary: 53 mg/dL — ABNORMAL LOW (ref 70–99)

## 2013-10-13 MED ORDER — TRAMADOL HCL 50 MG PO TABS: 50.0000 mg | ORAL_TABLET | Freq: Four times a day (QID) | ORAL | Status: DC | PRN

## 2013-10-13 MED ORDER — PANTOPRAZOLE SODIUM 40 MG PO TBEC: 40.0000 mg | DELAYED_RELEASE_TABLET | Freq: Every day | ORAL | Status: DC

## 2013-10-13 NOTE — Progress Notes (Signed)
Subjective: NG out.  Getting OOB and walking.  Sugars 97-168. Tolerating soft foods.  - Pump and basals discussed. Having BMs. Clears started.   Objective: Vital signs in last 24 hours: Temp:  [97.5 F (36.4 C)-98.2 F (36.8 C)] 98.2 F (36.8 C) (01/11 0524) Pulse Rate:  [78-93] 93 (01/11 0524) Resp:  [18] 18 (01/11 0524) BP: (124-146)/(55-67) 139/66 mmHg (01/11 0524) SpO2:  [95 %-99 %] 97 % (01/11 0524) Weight change:  Last BM Date: 10/12/13  Intake/Output from previous day: 01/10 0701 - 01/11 0700 In: 1040 [I.V.:1040] Out: -  Intake/Output this shift:   General appearance: alert, cooperative and appears stated age  Resp: clear to auscultation bilaterally  Cardio: regular rate and rhythm, S1, S2 normal, no murmur, click, rub or gallop  GI: incision dressed, mild tenderness  Extremities: extremities normal, atraumatic, no cyanosis or edema  Neurologic: Alert and oriented X 3, normal strength and tone. Normal symmetric reflexes.    Lab Results: No results found for this basename: WBC, HGB, HCT, PLT,  in the last 72 hours BMET  Recent Labs  10/11/13 0420 10/12/13 0500  NA 140 139  K 3.3* 3.6*  CL 102 98  CO2 29 29  GLUCOSE 236* 179*  BUN 7 6  CREATININE 0.53 0.49*  CALCIUM 7.6* 8.1*    Studies/Results: No results found.  Medications:  I have reviewed the patient's current medications. Scheduled: . enoxaparin (LOVENOX) injection  40 mg Subcutaneous Q24H  . influenza vac split quadrivalent PF  0.5 mL Intramuscular Tomorrow-1000  . insulin pump   Subcutaneous Q4H  . pantoprazole (PROTONIX) IV  40 mg Intravenous QHS  . pneumococcal 23 valent vaccine  0.5 mL Intramuscular Tomorrow-1000  . polyethylene glycol  17 g Oral Daily   Continuous: . sodium chloride 85 mL/hr at 10/13/13 0219   ZOX:[id number]:acetaminophen, dextrose, diphenhydrAMINE, diphenhydrAMINE, morphine injection, ondansetron, oxyCODONE-acetaminophen, phenol, sodium chloride,  traMADol  Assessment/Plan: SBO (small bowel obstruction) - S/P lysis of adhesions per general surgery.  Tolerating soft foods.  TNA stopped. Pain controlled.   DM Type 1, Continue pump. CBGs excellent on current dosing.  Will leave testing q4h.  No changes or adjustments today. The changes we made on 1/10 are listed below: 12 am 0.4 - leave alone 4am 0.65 - increase to 0.7 9:30 am 0.4 - increase to 0.6 11 am 0.35 - increase to 0.40 3 pm 0.4 - increase to 0.5 8 pm 0.3 - leave alone  Bolus as needed and we will adjust as needed.  Deprtession/Anxiety: Anxiety and mood fine today Pain control:  Hopefully home on PO narcs soon Incentive spirometer, using it hourly.  HTN: Diovan held, BP controlled. Restart Diovan at home Nutrition:tolerating POs    LOS: 7 days   Taylor Adams M 10/13/2013, 8:44 AM

## 2013-10-13 NOTE — Discharge Instructions (Signed)
[facility]      [facility] [phone]  OPEN ABDOMINAL SURGERY: POST OP INSTRUCTIONS  Always review your discharge instruction sheet given to you by the facility where your surgery was performed.  IF YOU HAVE DISABILITY OR FAMILY LEAVE FORMS, YOU MUST BRING THEM TO THE OFFICE FOR PROCESSING.  PLEASE DO NOT GIVE THEM TO YOUR DOCTOR.  1. A prescription for pain medication may be given to you upon discharge.  Take your pain medication as prescribed, if needed.  If narcotic pain medicine is not needed, then you may take acetaminophen (Tylenol) or ibuprofen (Advil) as needed. 2. Take your usually prescribed medications unless otherwise directed. 3. If you need a refill on your pain medication, please contact your pharmacy. They will contact our office to request authorization.  Prescriptions will not be filled after 5pm or on week-ends. 4. You should follow a light diet the first few days after arrival home, such as soup and crackers, pudding, etc.unless your doctor has advised otherwise. A high-fiber, low fat diet can be resumed as tolerated.   Be sure to include lots of fluids daily. Most patients will experience some swelling and bruising on the chest and neck area.  Ice packs will help.  Swelling and bruising can take several days to resolve 5. Most patients will experience some swelling and bruising in the area of the incision. Ice pack will help. Swelling and bruising can take several days to resolve..  6. It is common to experience some constipation if taking pain medication after surgery.  Increasing fluid intake and taking a stool softener will usually help or prevent this problem from occurring.  A mild laxative (Milk of Magnesia or Miralax) should be taken according to package directions if there are no bowel movements after 48 hours. 7.  You may have steri-strips (small skin tapes) in place directly over the incision.  These strips should be left on the skin for 7-10 days.  If your  surgeon used skin glue on the incision, you may shower in 24 hours.  The glue will flake off over the next 2-3 weeks.  Any sutures or staples will be removed at the office during your follow-up visit. You may find that a light gauze bandage over your incision may keep your staples from being rubbed or pulled. You may shower and replace the bandage daily. 8. ACTIVITIES:  You may resume regular (light) daily activities beginning the next day--such as daily self-care, walking, climbing stairs--gradually increasing activities as tolerated.  You may have sexual intercourse when it is comfortable.  Refrain from any heavy lifting or straining until approved by your doctor. a. You may drive when you no longer are taking prescription pain medication, you can comfortably wear a seatbelt, and you can safely maneuver your car and apply brakes b. Return to Work: ___________________________________ 9. You should see your doctor in the office for a follow-up appointment approximately two weeks after your surgery.  Make sure that you call for this appointment within a day or two after you arrive home to insure a convenient appointment time. OTHER INSTRUCTIONS:  _____________________________________________________________ _____________________________________________________________  WHEN TO CALL YOUR DOCTOR: 1. Fever over 101.0 2. Inability to urinate 3. Nausea and/or vomiting 4. Extreme swelling or bruising 5. Continued bleeding from incision. 6. Increased pain, redness, or drainage from the incision. 7. Difficulty swallowing or breathing 8. Muscle cramping or spasms. 9. Numbness or tingling in hands or feet or around lips.  The clinic staff is available to   answer your questions during regular business hours.  Please don't hesitate to call and ask to speak to one of the nurses if you have concerns.  For further questions, please visit [email or web address]   

## 2013-10-13 NOTE — Progress Notes (Signed)
[facility]   DOB: 1951/04/10, 63 y.o.   MRN: [id number] 6 Days Post-Op  Subjective: No complaints this morning. Tolerating her diet well. Bowels moving.  Objective: Vital signs in last 24 hours: Temp:  [97.5 F (36.4 C)-98.2 F (36.8 C)] 98.2 F (36.8 C) (01/11 0524) Pulse Rate:  [78-93] 93 (01/11 0524) Resp:  [18] 18 (01/11 0524) BP: (124-146)/(55-67) 139/66 mmHg (01/11 0524) SpO2:  [95 %-99 %] 97 % (01/11 0524) Last BM Date: 10/13/13  Intake/Output from previous day: 01/10 0701 - 01/11 0700 In: 1040 [I.V.:1040] Out: -  Intake/Output this shift:    General appearance: alert, cooperative, no distress and slowed mentation GI: normal findings: soft, non-tender Incision/Wound: clean and dry without evidence of infection.  Lab Results:  No results found for this basename: WBC, HGB, HCT, PLT,  in the last 72 hours BMET  Recent Labs  10/11/13 0420 10/12/13 0500  NA 140 139  K 3.3* 3.6*  CL 102 98  CO2 29 29  GLUCOSE 236* 179*  BUN 7 6  CREATININE 0.53 0.49*  CALCIUM 7.6* 8.1*     Studies/Results: No results found.  Anti-infectives: Anti-infectives   Start     Dose/Rate Route Frequency Ordered Stop   10/07/13 1815  piperacillin-tazobactam (ZOSYN) IVPB 3.375 g  Status:  Discontinued     3.375 g 12.5 mL/hr over 240 Minutes Intravenous Every 8 hours 10/07/13 1811 10/11/13 0951   10/07/13 1600  cefTRIAXone (ROCEPHIN) 1 g in dextrose 5 % 50 mL IVPB  Status:  Discontinued     1 g 100 mL/hr over 30 Minutes Intravenous Every 24 hours 10/06/13 1816 10/07/13 1811   10/07/13 0900  [MAR Hold]  ceFAZolin (ANCEF) IVPB 1 g/50 mL premix     (On MAR Hold since 10/07/13 1201)   1 g 100 mL/hr over 30 Minutes Intravenous  Once 10/07/13 0849 10/07/13 1321   10/07/13 0000  cefTRIAXone (ROCEPHIN) 1 g in dextrose 5 % 50 mL IVPB  Status:  Discontinued     1 g 100 mL/hr over 30 Minutes Intravenous Every 24 hours 10/06/13 1816 10/06/13 1835   10/06/13 1515   cefTRIAXone (ROCEPHIN) 1 g in dextrose 5 % 50 mL IVPB     1 g 100 mL/hr over 30 Minutes Intravenous  Once 10/06/13 1509 10/06/13 1705      Assessment/Plan: s/p Procedure(s): EXPLORATORY LAPAROTOMY LYSIS OF ADHESION ILEOCECETOMY Doing well without apparent complication. Okay for discharge. Return to the office in one week for staple removal.   LOS: 7 days    Lupe Bonner T 10/13/2013

## 2013-10-14 ENCOUNTER — Telehealth (INDEPENDENT_AMBULATORY_CARE_PROVIDER_SITE_OTHER): Payer: Self-pay

## 2013-10-14 LAB — CULTURE, BLOOD (ROUTINE X 2)
Culture: NO GROWTH
Culture: NO GROWTH

## 2013-10-14 NOTE — Telephone Encounter (Addendum)
DR. Derrell Lollingamirez gave order for staple removal of 14 days post op per Maryan PulsChristy Moore CMA appt 10-21-13 @ @230  patient aware

## 2013-10-14 NOTE — Telephone Encounter (Addendum)
Patient states she been having urgency/frequency of urination , she states his has been going on before surgery advised her to call PCP to be assessed . Patient is also asking when she can have her staples removed. I expressed we will call her with date /time.

## 2013-10-14 NOTE — Telephone Encounter (Signed)
Dr. Derrell Lollingamirez wants patient to have staples removed in two weeks (14 days)

## 2013-10-16 ENCOUNTER — Ambulatory Visit: Payer: 59 | Admitting: Internal Medicine

## 2013-10-17 ENCOUNTER — Encounter (INDEPENDENT_AMBULATORY_CARE_PROVIDER_SITE_OTHER): Payer: 59

## 2013-10-21 ENCOUNTER — Ambulatory Visit (INDEPENDENT_AMBULATORY_CARE_PROVIDER_SITE_OTHER): Payer: 59 | Admitting: General Surgery

## 2013-10-21 ENCOUNTER — Encounter (INDEPENDENT_AMBULATORY_CARE_PROVIDER_SITE_OTHER): Payer: Self-pay | Admitting: General Surgery

## 2013-10-21 VITALS — BP 100/62 | HR 76 | Temp 97.4°F | Resp 14 | Ht 62.0 in | Wt 102.4 lb

## 2013-10-21 DIAGNOSIS — Z9889 Other specified postprocedural states: Secondary | ICD-10-CM

## 2013-10-21 MED ORDER — TRAMADOL HCL 50 MG PO TABS: 50.0000 mg | ORAL_TABLET | Freq: Four times a day (QID) | ORAL | Status: DC | PRN

## 2013-10-21 NOTE — Progress Notes (Signed)
Patient ID: [facility]   DOB: November 19, 1950, 63 y.o.   MRN: [id number] Post op course The patient has done well postoperatively. The patient has decreased appetite at this time however is taking some by mouth. Patient is having normal bowel function. She does say she has a fatigue at times. She also is taking  Tramadol for pain on regular basis.  On Exam: Her midline wound is clean dry and intact with staples in place.  Assessment and Plan 63 year old female status post small bowel excision and reanastomosis secondary to small bowel volvulus 1. We will remove her staples this clinic visit 2. I will give her a refill of her tramadol #30 3. We will give her a followup appointment in 4 weeks for postop check and possible releasable work.   Axel FillerArmando Tessi Eustache, MD [facility] General [facility] Surgery Trauma & Emergency Surgery

## 2013-10-29 NOTE — Discharge Summary (Signed)
Physician Discharge Summary  Taylor Adams [facility] ZOX:[id number] DOB: 07-Nov-1950 DOA: 10/06/2013  PCP: Gaspar Garbe, MD  Consultation: internal medicine    Diabetes coordinator   Admit date: 10/06/2013 Discharge date: 10/13/2013  Recommendations for Outpatient Follow-up:   Follow-up Information   Follow up with Lajean Saver, MD. Schedule an appointment as soon as possible for a visit in 1 week.   Specialty:  General Surgery   Contact information:   [address] [place] [address] [phone]      Discharge Diagnoses:  1. Small bowel obstruction 2. Type I diabetes mellitus 3. Hypertension 4. Protein calorie malnutrition 5. UTI 6. Gastroparesis 7. hypokalemia   Surgical Procedure: Exploratory laparotomy with LOA, ileocecectomy Dr. Derrell Lolling 10/07/12    Discharge Condition: stable  Disposition: home  Diet recommendation: carb modified   Filed Weights   10/06/13 1816  Weight: 115 lb (52.164 kg)     Hospital Course:  Taylor Adams is a 63 yo female who has a h/o type I DM, hypertension and gastroparesis who presented to [facility] with abdominal pain.  A CT revealed a high grade small bowel obstruction.  NGT Was placed, repeat films in AM.  She did not improve overnight and therefore underwent an exploratory laparotomy with LOA and ileocecectomy.  She did not have any intraoperative issues.  She was advanced slowly, NGT kept in for several days due to history of gastroparesis.  Internal medicine and diabetes coordinator were consulted to help with insulin pump and managing her diabetes.  She was treated for a UTI, repeat UA was normal.  She also had hypokalemia which was supplemented and monitored.  She was started on TPN due to expected ileus.  This was stopped as her bowel function returned and oral intake was resumed.  On POD#6 the patient was tolerating a diet, pain well controlled, having bowel movements.  She was therefore felt stable for discharge.  She was advised  to follow up in 1 week for staples removal per Dr. Johna Sheriff.   Discharge Instructions  Discharge Orders   Future Appointments Provider Department Dept Phone   11/21/2013 11:30 AM Axel Filler, MD [facility], [place] [phone]   Future Orders Complete By Expires   Discharge patient  As directed        Medication List    STOP taking these medications       traMADol 50 MG tablet  Commonly known as:  ULTRAM      TAKE these medications       CALCIUM PO  Take 1 tablet by mouth daily.     CHROMIUM PO  Take 1 tablet by mouth daily.     DIOVAN 80 MG tablet  Generic drug:  valsartan  Take 80 mg by mouth daily.     insulin pump 100 unit/ml Soln  Inject into the skin continuous.     MAGNESIUM PO  Take 1 tablet by mouth daily.     PROBIOTIC PO  Take 1 tablet by mouth daily.           Follow-up Information   Follow up with Lajean Saver, MD. Schedule an appointment as soon as possible for a visit in 1 week.   Specialty:  General Surgery   Contact information:   [address] [place] [address] [phone]        The results of significant diagnostics from this hospitalization (including imaging, microbiology, ancillary and laboratory) are listed below for reference.    Significant  Diagnostic Studies: Ct Abdomen Pelvis W Contrast  10/06/2013   CLINICAL DATA:  Nausea, vomiting and abdominal pain.  EXAM: CT ABDOMEN AND PELVIS WITH CONTRAST  TECHNIQUE: Multidetector CT imaging of the abdomen and pelvis was performed using the standard protocol following bolus administration of intravenous contrast.  CONTRAST:  OMNIPAQUE IOHEXOL 300 MG/ML  SOLN  COMPARISON:  CT of the abdomen and pelvis 06/04/2011.  FINDINGS: Lung Bases: Trace right pleural effusion.  Small hiatal hernia.  Abdomen/Pelvis: Mild periportal edema in the liver. The appearance of the liver is otherwise unremarkable. The appearance of the gallbladder, pancreas, spleen, bilateral  adrenal glands and bilateral kidneys is unremarkable. Small volume of ascites, most pronounced in the low anatomic pelvis. In the low anatomic pelvis there is some apparent peritoneal enhancement, which could indicate some peritonitis. Extensive dilatation of the small bowel which generally measures up to approximately 4.4 cm in diameter. However, shortly before the terminal ileum there is massive dilatation of the distal ileum which measures up to 7.3 cm in diameter. Beyond this there is abrupt transition to completely decompressed terminal ileum and nearly completely decompressed colon. The stomach is also essentially completely decompressed, totally distorted related to mass effect from the adjacent dilated loops of small bowel. No pneumoperitoneum.  Musculoskeletal: There are no aggressive appearing lytic or blastic lesions noted in the visualized portions of the skeleton.  IMPRESSION: 1. Findings, as above, compatible with small bowel obstruction. There is an apparent transition point in the region of the distal/terminal ileum, as discussed above. Surgical consultation is recommended. 2. Small volume of ascites, most pronounced in the pelvis where there is some peritoneal enhancement. This may indicate peritonitis. The possibility of perforation is difficult to entirely exclude, but is not strongly favored at this time. No frank pneumoperitoneum is noted. 3. Trace right pleural effusion. 4. Small hiatal hernia.   Electronically Signed   By: Trudie Reed M.D.   On: 10/06/2013 14:54   Dg Abd 2 Views  10/07/2013   CLINICAL DATA:  Small bowel obstruction.  Abdominal pain.  EXAM: ABDOMEN - 2 VIEW  COMPARISON:  10/06/2013  FINDINGS: Nasogastric tube is coiled within the proximal stomach. Multiple abnormally dilated loops of small bowel are again identified. There are numerous air-fluid levels within both sides of the abdomen.  IMPRESSION: 1. Persistent high-grade small bowel obstruction.   Electronically Signed    By: Signa Kell M.D.   On: 10/07/2013 08:28   Dg Abd Acute W/chest  10/06/2013   CLINICAL DATA:  Abdominal pain with distention.  EXAM: ACUTE ABDOMEN SERIES (ABDOMEN 2 VIEW & CHEST 1 VIEW)  COMPARISON:  CT 06/14/2011  FINDINGS: Chest radiograph demonstrates clear lungs. Normal appearance of the heart and mediastinum. The trachea is midline. There are multiple air-fluid levels in the small bowel with small bowel distention. Lucency underneath the left hemidiaphragm could be related to the stomach. Small bowel loops measure up to 4.1 cm on the supine images. 1.2 cm oval-shaped radiopaque structure in the right hemipelvis of uncertain etiology. This may represent an ingested structure. No significant gas within the colon.  IMPRESSION: Small bowel dilatation with multiple air-fluid levels. Findings are suggestive for a high-grade small-bowel obstruction. Lucency underneath the left hemidiaphragm may be related to the stomach but difficult to completely exclude free air. Recommend further evaluation with CT.  No acute chest findings.  These results were called by telephone at the time of interpretation on 10/06/2013 at 1:08 PM to Dr. Gwyneth Sprout , who verbally  acknowledged these results.   Electronically Signed   By: Richarda OverlieAdam  Henn M.D.   On: 10/06/2013 13:09    Principal Problem:   SBO (small bowel obstruction)   I did not personally see or examine the patient on the day of discharge.      Signed:  Ashok NorrisEmina Weldon Nouri, ANP-BC

## 2013-11-06 ENCOUNTER — Telehealth (INDEPENDENT_AMBULATORY_CARE_PROVIDER_SITE_OTHER): Payer: Self-pay | Admitting: General Surgery

## 2013-11-06 NOTE — Telephone Encounter (Signed)
Pt called to ask is she should be having pain this late after her surgery.  Pt noted to be slightly SOB and admits she is experiencing the pain now.  She describes pain at the incision that is "shooting" and goes across to the lower abdomen.  It does come in waves.  She has not taken her pain medicine for last two days to try to "tough it out."  Discussed with Dr. Derrell Lollingamirez; may be related to scar tissue forming.  Advised her to take the tramadol and can also apply ice to site for numbing comfort.  Can try Aleve 2 pills Q12H or Ibuprofen 800 mg Q8H.  She understands and will call back prn.

## 2013-11-07 ENCOUNTER — Other Ambulatory Visit (INDEPENDENT_AMBULATORY_CARE_PROVIDER_SITE_OTHER): Payer: Self-pay | Admitting: General Surgery

## 2013-11-12 ENCOUNTER — Telehealth (INDEPENDENT_AMBULATORY_CARE_PROVIDER_SITE_OTHER): Payer: Self-pay | Admitting: *Deleted

## 2013-11-12 NOTE — Telephone Encounter (Signed)
Patient called to report that she is having some bleeding coming from her incision site.  I explained that this sounds like just some trapped blood that was still under the incision site that is just draining out.  Patient denied any signs or symptoms of infection.  Patient is satisfied with my explanation regarding the bleeding.  Patient did however bring up that she still has a decreased appetite and has generalized fatigue.  Patient reports that she is unable to get out and run errands with her husband due to being so tired.  Patient also states that she is out of her Tramadol but still having pain control issues.  Explained that I would send a message to Dr. Derrell Lollingamirez to make him aware then we will let her know what his recommendations are.  Patient states understanding and agreeable at this time.

## 2013-11-13 ENCOUNTER — Telehealth (INDEPENDENT_AMBULATORY_CARE_PROVIDER_SITE_OTHER): Payer: Self-pay | Admitting: General Surgery

## 2013-11-13 NOTE — Telephone Encounter (Signed)
Called pharmacy and refilled the Rx per order Dr. Derrell Lollingamirez.

## 2013-11-13 NOTE — Telephone Encounter (Signed)
OK to refill her tramadol

## 2013-11-13 NOTE — Telephone Encounter (Signed)
Pt called [facility] to refill her Tramadol.  Advise if okay to refill.

## 2013-11-15 NOTE — Telephone Encounter (Signed)
Spoke to Dr. Derrell Lollingamirez and pre-authorization for medication has been taken care of

## 2013-11-20 ENCOUNTER — Telehealth (INDEPENDENT_AMBULATORY_CARE_PROVIDER_SITE_OTHER): Payer: Self-pay | Admitting: General Surgery

## 2013-11-20 NOTE — Telephone Encounter (Signed)
Pt called to report she has re-agrivated a pain in her surgical area, prompted by vomiting over the week-end.  She has had previous problems with possible nerve involvement at the site, so she wonders if that has been re-ignited from the vomiting.  Has appt tomorrow.  Suggested she apply ice packs and take NSAIDs today.  She agrees and will keep appt.

## 2013-11-21 ENCOUNTER — Encounter (INDEPENDENT_AMBULATORY_CARE_PROVIDER_SITE_OTHER): Payer: Self-pay | Admitting: General Surgery

## 2013-11-21 ENCOUNTER — Ambulatory Visit (INDEPENDENT_AMBULATORY_CARE_PROVIDER_SITE_OTHER): Payer: 59 | Admitting: General Surgery

## 2013-11-21 VITALS — BP 100/64 | HR 64 | Resp 16 | Ht 62.0 in | Wt 97.0 lb

## 2013-11-21 DIAGNOSIS — Z9889 Other specified postprocedural states: Secondary | ICD-10-CM

## 2013-11-21 MED ORDER — TRAMADOL HCL 50 MG PO TABS: 50.0000 mg | ORAL_TABLET | Freq: Four times a day (QID) | ORAL | Status: DC | PRN

## 2013-11-21 NOTE — Progress Notes (Signed)
Patient ID: [facility]by [facility]anda [facility]   DOB: Sep 24, 1951, 63 y.o.   MRN: [id number] Post op course The patient is a 63 year old female status post exploratory laparoscopy with small bowel resection and anastomosis secondary to small bowel volvulus.  Since her last visit the patient states she's had some cramping and some borborygmi. She states over the last week she had a stomach virus which encompassed some emesis and some diarrhea. She states she has been dehydrated secondary to this. She states prior to this she was eating well, and Having normal bowel function.  She did state she was having some abdominal cramping and this time as well. She states and the bowel was helping with this.  On Exam: Her midline wound is well-healed  Assessment and Plan The patient is a 63 year old female status post exploratory laparoscopy with small bowel resection and anastomosis secondary to small bowel volvulus. 1. I discussed with the patient that she most likely should not return to work for the next 2 weeks. We'll have the patient follow back up in 2 weeks and make a decision at that time as far as her return to work. 2. I discussed with her stay hydrated secondary to her gastroenteritis. 3. We also discussed taking Imodium for any intense cramping.    Axel FillerArmando Damarri Rampy, MD [facility] General & Minimally Invasive Surgery Trauma & Emergency Surgery

## 2013-12-05 ENCOUNTER — Encounter (INDEPENDENT_AMBULATORY_CARE_PROVIDER_SITE_OTHER): Payer: Self-pay | Admitting: General Surgery

## 2013-12-05 ENCOUNTER — Ambulatory Visit (INDEPENDENT_AMBULATORY_CARE_PROVIDER_SITE_OTHER): Payer: 59 | Admitting: General Surgery

## 2013-12-05 ENCOUNTER — Encounter (INDEPENDENT_AMBULATORY_CARE_PROVIDER_SITE_OTHER): Payer: Self-pay

## 2013-12-05 VITALS — BP 112/72 | HR 80 | Temp 98.0°F | Resp 14 | Ht 60.0 in | Wt 103.6 lb

## 2013-12-05 DIAGNOSIS — Z9889 Other specified postprocedural states: Secondary | ICD-10-CM

## 2013-12-05 NOTE — Progress Notes (Signed)
Patient ID: Taylor BarretteWanda W [facility], female   DOB: Aug 28, 1951, 63 y.o.   MRN: [id number] Post op course The patient is a 63 year old female status post exploratory laparoscopy and small bowel resection secondary to small bowel volvulus.  The patient has been doing well since her last clinic visit. She did state that her abdominal pain and cramping has gotten better and was likely related to gastroenteritis.  On Exam: Wound is clean dry and intact  Assessment and Plan 63 year old female status post exploratory laparoscopy small bowel resection 1. The patient can resume work for one week, going back half days initially for 2 weeks and then proceeding full days. 2. The patient follow up as needed   Taylor FillerArmando Taylor Dow, MD [facility] General & Minimally Invasive Surgery Trauma & Emergency Surgery

## 2013-12-19 ENCOUNTER — Encounter (INDEPENDENT_AMBULATORY_CARE_PROVIDER_SITE_OTHER): Payer: 59 | Admitting: General Surgery

## 2015-10-04 HISTORY — PX: CATARACT EXTRACTION: SUR2

## 2016-01-28 ENCOUNTER — Encounter: Payer: Self-pay | Admitting: Gastroenterology

## 2016-02-16 ENCOUNTER — Other Ambulatory Visit: Payer: Self-pay

## 2016-02-16 DIAGNOSIS — Z1231 Encounter for screening mammogram for malignant neoplasm of breast: Secondary | ICD-10-CM

## 2016-02-17 ENCOUNTER — Encounter: Payer: Self-pay | Admitting: Gastroenterology

## 2016-02-25 ENCOUNTER — Other Ambulatory Visit: Payer: Self-pay

## 2016-02-25 ENCOUNTER — Ambulatory Visit
Admission: RE | Admit: 2016-02-25 | Discharge: 2016-02-25 | Disposition: A | Discharge status: Home / Self Care | Payer: Commercial Managed Care - HMO | Source: Ambulatory Visit

## 2016-02-25 DIAGNOSIS — Z1231 Encounter for screening mammogram for malignant neoplasm of breast: Secondary | ICD-10-CM

## 2016-04-13 ENCOUNTER — Telehealth: Payer: Self-pay | Admitting: *Deleted

## 2016-04-13 ENCOUNTER — Telehealth: Payer: Self-pay

## 2016-04-13 NOTE — Telephone Encounter (Signed)
Dr Wylene Simmerisovec,  Patient has an insulin pump and is scheduled for a colonoscopy on 7/28. Do you manage her pump? If you do what would be her instructions in holding pump

## 2016-04-13 NOTE — Telephone Encounter (Signed)
Who manages her pump?

## 2016-04-13 NOTE — Telephone Encounter (Signed)
Robbin,      I have you listed as Dr Elana AlmNandigam's CMA.  Could you please obtain INSULIN PUMP instructions from MD managing her diabetes?                                                                                                                                                                                                                          Thank you, Angela//PV

## 2016-04-13 NOTE — Telephone Encounter (Signed)
Dr Guerry Bruinichard Tisovec

## 2016-04-13 NOTE — Telephone Encounter (Signed)
Please fax orders to Ralonda Tartt at [phone]

## 2016-04-13 NOTE — Telephone Encounter (Signed)
See other phone note  Sent request to Dr Wylene Simmerisovec

## 2016-04-15 ENCOUNTER — Ambulatory Visit (AMBULATORY_SURGERY_CENTER): Payer: Self-pay

## 2016-04-15 VITALS — Ht 62.0 in | Wt 108.2 lb

## 2016-04-15 DIAGNOSIS — Z860100 Personal history of colon polyps, unspecified: Secondary | ICD-10-CM

## 2016-04-15 MED ORDER — SUPREP BOWEL PREP KIT 17.5-3.13-1.6 GM/177ML PO SOLN: 1.0000 | Freq: Once | ORAL | Status: DC

## 2016-04-15 NOTE — Progress Notes (Signed)
No allergies to eggs or soy No diet meds No home oxygen PONV probably with general anesthesia  Has email and internet; registered for emmi

## 2016-04-18 ENCOUNTER — Encounter: Payer: Self-pay | Admitting: Gastroenterology

## 2016-04-22 NOTE — Telephone Encounter (Signed)
Left message with nurse Dr Wylene Simmerisovec to return my call about insulin pump. Patient has a procedure on the 28th

## 2016-04-25 NOTE — Telephone Encounter (Signed)
Letter received from [facility] concerning insulin pump. Use Temp Basal of 80% for 4 hours-start 1 hour prior to procedure. Do Not bolus unless BG >200

## 2016-04-26 NOTE — Telephone Encounter (Signed)
Pt aware of all insulin instructions  See previous phone note.. Letter sent to be scanned in with all instructions

## 2016-04-29 ENCOUNTER — Other Ambulatory Visit: Payer: Self-pay

## 2016-04-29 ENCOUNTER — Encounter: Payer: Self-pay | Admitting: Gastroenterology

## 2016-04-29 ENCOUNTER — Ambulatory Visit (AMBULATORY_SURGERY_CENTER): Payer: Commercial Managed Care - HMO | Admitting: Gastroenterology

## 2016-04-29 VITALS — BP 115/68 | HR 65 | Temp 96.6°F | Resp 18 | Ht 62.0 in | Wt 108.0 lb

## 2016-04-29 DIAGNOSIS — Z86010 Personal history of colonic polyps: Secondary | ICD-10-CM

## 2016-04-29 LAB — GLUCOSE, CAPILLARY
GLUCOSE-CAPILLARY: 253 mg/dL — AB (ref 65–99)
GLUCOSE-CAPILLARY: 230 mg/dL — AB (ref 65–99)

## 2016-04-29 NOTE — Patient Instructions (Signed)
YOU HAD AN ENDOSCOPIC PROCEDURE TODAY AT THE [facility]:   Refer to the procedure report that was given to you for any specific questions about what was found during the examination.  If the procedure report does not answer your questions, please call your gastroenterologist to clarify.  If you requested that your care partner not be given the details of your procedure findings, then the procedure report has been included in a sealed envelope for you to review at your convenience later.  YOU SHOULD EXPECT: Some feelings of bloating in the abdomen. Passage of more gas than usual.  Walking can help get rid of the air that was put into your GI tract during the procedure and reduce the bloating. If you had a lower endoscopy (such as a colonoscopy or flexible sigmoidoscopy) you may notice spotting of blood in your stool or on the toilet paper. If you underwent a bowel prep for your procedure, you may not have a normal bowel movement for a few days.  Please Note:  You might notice some irritation and congestion in your nose or some drainage.  This is from the oxygen used during your procedure.  There is no need for concern and it should clear up in a day or so.  SYMPTOMS TO REPORT IMMEDIATELY:   Following lower endoscopy (colonoscopy or flexible sigmoidoscopy):  Excessive amounts of blood in the stool  Significant tenderness or worsening of abdominal pains  Swelling of the abdomen that is new, acute  Fever of 100F or higher For urgent or emergent issues, a gastroenterologist can be reached at any hour by calling [phone].   DIET: Your first meal following the procedure should be a small meal and then it is ok to progress to your normal diet. Heavy or fried foods are harder to digest and may make you feel nauseous or bloated.  Likewise, meals heavy in dairy and vegetables can increase bloating.  Drink plenty of fluids but you should avoid alcoholic beverages for 24 hours.  ACTIVITY:   You should plan to take it easy for the rest of today and you should NOT DRIVE or use heavy machinery until tomorrow (because of the sedation medicines used during the test).    FOLLOW UP: Our staff will call the number listed on your records the next business day following your procedure to check on you and address any questions or concerns that you may have regarding the information given to you following your procedure. If we do not reach you, we will leave a message.  However, if you are feeling well and you are not experiencing any problems, there is no need to return our call.  We will assume that you have returned to your regular daily activities without incident.  SIGNATURES/CONFIDENTIALITY: You and/or your care partner have signed paperwork which will be entered into your electronic medical record.  These signatures attest to the fact that that the information above on your After Visit Summary has been reviewed and is understood.  Full responsibility of the confidentiality of this discharge information lies with you and/or your care-partner.  Please read over handout about hemorrhoids, diverticulosis, and high fiber diets  Please continue your normal medications  Next colonoscopy-10 years

## 2016-04-29 NOTE — Op Note (Signed)
[facility] Patient Name: Taylor Adams Procedure Date: 04/29/2016 7:43 AM MRN: [id number] Endoscopist: Napoleon Form , MD Age: 65 Referring MD:  Date of Birth: 17-Aug-1951 Gender: Female Account #: [id number] Procedure:                Colonoscopy Indications:              Screening for colorectal malignant neoplasm, Last                            colonoscopy: 2007 Medicines:                Monitored Anesthesia Care Procedure:                Pre-Anesthesia Assessment:                           - Prior to the procedure, a History and Physical                            was performed, and patient medications and                            allergies were reviewed. The patient's tolerance of                            previous anesthesia was also reviewed. The risks                            and benefits of the procedure and the sedation                            options and risks were discussed with the patient.                            All questions were answered, and informed consent                            was obtained. Prior Anticoagulants: The patient has                            taken no previous anticoagulant or antiplatelet                            agents. ASA Grade Assessment: II - A patient with                            mild systemic disease. After reviewing the risks                            and benefits, the patient was deemed in                            satisfactory condition to undergo the procedure.                           -  Prior to the procedure, a History and Physical                            was performed, and patient medications and                            allergies were reviewed. The patient's tolerance of                            previous anesthesia was also reviewed. The risks                            and benefits of the procedure and the sedation                            options and risks were discussed with the  patient.                            All questions were answered, and informed consent                            was obtained. Prior Anticoagulants: The patient has                            taken no previous anticoagulant or antiplatelet                            agents. ASA Grade Assessment: II - A patient with                            mild systemic disease. After reviewing the risks                            and benefits, the patient was deemed in                            satisfactory condition to undergo the procedure.                           After obtaining informed consent, the colonoscope                            was passed under direct vision. Throughout the                            procedure, the patient's blood pressure, pulse, and                            oxygen saturations were monitored continuously. The                            Model CF-HQ190L [id number]) scope was introduced  through the anus and advanced to the the                            ileocolonic anastomosis. The colonoscopy was                            performed without difficulty. The patient tolerated                            the procedure well. The quality of the bowel                            preparation was good. Ileocolonic anastamosis were                            photographed. Scope In: 8:12:26 AM Scope Out: 8:31:20 AM Scope Withdrawal Time: 0 hours 11 minutes 9 seconds  Total Procedure Duration: 0 hours 18 minutes 54 seconds  Findings:                 The perianal and digital rectal examinations were                            normal. Identified site of ileo colonic anastamosis                            with intact well healed suture line                           Scattered small-mouthed diverticula were found in                            the sigmoid colon.                           Non-bleeding internal hemorrhoids were found during                             retroflexion. The hemorrhoids were small.                           The exam was otherwise without abnormality. Complications:            No immediate complications. Estimated Blood Loss:     Estimated blood loss: none. Impression:               - Diverticulosis in the sigmoid colon.                           - Non-bleeding internal hemorrhoids.                           - The examination was otherwise normal.                           - No specimens collected. Recommendation:           - Patient has  a contact number available for                            emergencies. The signs and symptoms of potential                            delayed complications were discussed with the                            patient. Return to normal activities tomorrow.                            Written discharge instructions were provided to the                            patient.                           - Resume previous diet.                           - Continue present medications.                           - Repeat colonoscopy in 10 years for screening                            purposes.                           - Return to GI clinic PRN. Napoleon Form, MD 04/29/2016 8:37:08 AM This report has been signed electronically.

## 2016-04-29 NOTE — Progress Notes (Signed)
A and O x3. Report to RN. Tolerated MAC anesthesia well. 

## 2016-04-29 NOTE — Progress Notes (Signed)
No egg or soy allergy known to patient  No issues with past sedation with any surgeries  or procedures, no intubation problems  No diet pills per patient No home 02 use per patient  No blood thinners per patient  Pt denies issues with constipation   

## 2016-05-02 ENCOUNTER — Telehealth: Payer: Self-pay

## 2016-05-02 NOTE — Telephone Encounter (Signed)
Left a message at [phone] for the pt to call us back if any questions or concerns. maw

## 2016-05-02 NOTE — Telephone Encounter (Signed)
I

## 2016-05-11 NOTE — Telephone Encounter (Signed)
Telephone call was charted on.  Entered twice in error. maw

## 2016-05-17 ENCOUNTER — Encounter: Payer: Self-pay | Admitting: Gastroenterology

## 2016-05-17 ENCOUNTER — Ambulatory Visit (INDEPENDENT_AMBULATORY_CARE_PROVIDER_SITE_OTHER): Payer: Commercial Managed Care - HMO | Admitting: Gastroenterology

## 2016-05-17 VITALS — BP 104/70 | HR 62 | Ht 62.0 in | Wt 109.0 lb

## 2016-05-17 DIAGNOSIS — R1084 Generalized abdominal pain: Secondary | ICD-10-CM

## 2016-05-17 DIAGNOSIS — K6389 Other specified diseases of intestine: Secondary | ICD-10-CM | POA: Diagnosis not present

## 2016-05-17 DIAGNOSIS — R140 Abdominal distension (gaseous): Secondary | ICD-10-CM | POA: Diagnosis not present

## 2016-05-17 MED ORDER — RIFAXIMIN 550 MG PO TABS: 550.0000 mg | ORAL_TABLET | Freq: Three times a day (TID) | ORAL | 0 refills | Status: DC

## 2016-05-17 NOTE — Patient Instructions (Signed)
You have been scheduled for an Upper GI Series and Small Bowel Follow Thru at [facility]. Your appointment is on 05/25/2016 at 11am. Please arrive 15 minutes prior to your test for registration. Make certain not to have anything to eat or drink after midnight on the night before your test. If you need to reschedule, please contact radiology at [phone]. --------------------------------------------------------------------------------------------------------------- An upper GI series uses x rays to help diagnose problems of the upper GI tract, which includes the esophagus, stomach, and duodenum. The duodenum is the first part of the small intestine. An upper GI series is conducted by a radiology technologist or a radiologist-a doctor who specializes in x-ray imaging-at a hospital or outpatient center. While sitting or standing in front of an x-ray machine, the patient drinks barium liquid, which is often white and has a chalky consistency and taste. The barium liquid coats the lining of the upper GI tract and makes signs of disease show up more clearly on x rays. X-ray video, called fluoroscopy, is used to view the barium liquid moving through the esophagus, stomach, and duodenum. Additional x rays and fluoroscopy are performed while the patient lies on an x-ray table. To fully coat the upper GI tract with barium liquid, the technologist or radiologist may press on the abdomen or ask the patient to change position. Patients hold still in various positions, allowing the technologist or radiologist to take x rays of the upper GI tract at different angles. If a technologist conducts the upper GI series, a radiologist will later examine the images to look for problems.  This test typically takes about 1 hour to complete --------------------------------------------------------------------------------------------------------------------------------------------- The Small Bowel Follow Thru examination is used  to visualize the entire small bowel (intestines); specifically the connection between the small and large intestine. You will be positioned on a flat x-ray table and an image of your abdomen taken. Then the technologist will show the x-ray to the radiologist. The radiologist will instruct your technologist how much (1-2 cups) barium sulfate you will drink and when to begin taking the timed x-rays, usually 15-30 minutes after you begin drinking. Barium is a harmless substance that will highlight your small intestine by absorbing x-ray. The taste is chalky and it feels very heavy both in the cup and in your stomach.  After the first x-ray is taken and shown to the radiologist, he/she will determine when the next image is to be taken. This is repeated until the barium has reached the end of the small intestine and enters the beginning of the colon (cecum). At such time when the barium spills into the colon, you will be positioned on the x-ray table once again. The radiologist will use a fluoroscopic camera to take some detailed pictures of the connection between your small intestine and colon. The fluoroscope is an x-ray unit that works with a television/computer screen. The radiologist will apply pressure to your abdomen with his/her hand and a lead glove, a plastic paddle, or a paddle with an inflated rubber balloon on the end. This is to spread apart your loops of intestine so he/she can see all areas.   This test typically takes around 1 hour to complete.   Drink plenty of water (8-10 cups/day) for a few days following the procedure to avoid constipation and blockage. The barium will make your stools white for a few days. --------------------------------------------------------------------------------------------------------------------------------------------   Use IB Guard 1-2 capsules twice a day as needed  We will send Xifaxian to your pharmacy 550 mg  three times a day for 14 days

## 2016-05-20 NOTE — Progress Notes (Signed)
[facility]    [id number]    July 11, 1951  Primary Care Physician:Richard Laymond Purser, MD  Referring Physician: Gaspar Garbe, MD [address] [place], [place] [address]  Chief complaint:  Abdominal distention and discomfort  HPI:  65 year old white female previously followed by Dr. Juanda Chance with insulin-dependent diabetes with episodic abdominal pain localized to the periumbilical area associated with severe cramps is here for follow-up visit. She has had similar complaints for many years and had episode of high-grade SBO in January 2015 when she underwent exploratory laparotomy with lysis of adhesions and ileocectomy. She is worried that she may be getting bowel obstruction again. In 2002 she was diagnosed with bacterial overgrowth on a breath Hydrogen test.  She has documented gastroparesis. She was on Reglan but it was discontinued 2 years ago given no significant improvement though her symptoms are atypical for gastroparesis. Patient had a colonoscopy in 2002 with findings of a tubular adenoma. A repeat colonoscopy in 2007 was normal. Recent colonoscopy in July 2017 was unremarkable as well except for sigmoid diverticulosis .  Other past GI workup include  small bowel follow-through in 2005 was normal. An upper abdominal ultrasound in 2005 was negative. Her celiac profile was normal in 2005. Patient denies any episodes of vomiting, she has intermittent nausea associated with significant bloating and abdominal cramps. Weight is stable. Her bowel habits are irregular, alternating constipation and diarrhea.   Outpatient Encounter Prescriptions as of 05/17/2016  Medication Sig  . B-D ULTRA-FINE 33 LANCETS MISC   . BAYER CONTOUR NEXT TEST test strip   . Biotin 5000 MCG TABS Take by mouth.  Marland Kitchen CALCIUM PO Take 1 tablet by mouth daily.  . Cholecalciferol (VITAMIN D3) 1000 units CAPS Take by mouth 2 (two) times daily.  . CHROMIUM PO Take 1 tablet by mouth daily.  Marland Kitchen DIOVAN 80 MG  tablet Take 80 mg by mouth daily.   Marland Kitchen DYMISTA 137-50 MCG/ACT SUSP USE 2 SPRAYS INTO EACH NOSTRIL EVERY DAY  . GLUCAGEN HYPOKIT 1 MG SOLR injection USE AS NEEDED FOR SEVERE HYPOGLYCEMIA  . hyoscyamine (LEVSIN SL) 0.125 MG SL tablet Place 0.125 mg under the tongue every 4 (four) hours as needed.  . Insulin Human (INSULIN PUMP) 100 unit/ml SOLN Inject into the skin continuous.  Marland Kitchen MAGNESIUM PO Take 1 tablet by mouth daily.  . Multiple Vitamin (MULTIVITAMIN) tablet Take 1 tablet by mouth daily.  Marland Kitchen NOVOLOG 100 UNIT/ML injection   . PREMARIN vaginal cream INSERT 1 GRAM THREE TIMES A WEEK AS DIRECTED  . Probiotic Product (PROBIOTIC PO) Take 1 tablet by mouth daily.  . traMADol (ULTRAM) 50 MG tablet Take 1 tablet (50 mg total) by mouth every 6 (six) hours as needed.  Marland Kitchen Ubiquinol 100 MG CAPS Take by mouth.  . rifaximin (XIFAXAN) 550 MG TABS tablet Take 1 tablet (550 mg total) by mouth 3 (three) times daily. For 14 days   No facility-administered encounter medications on file as of 05/17/2016.     Allergies as of 05/17/2016 - Review Complete 04/29/2016  Allergen Reaction Noted  . Codeine Itching 04/15/2016  . Latex Itching 04/15/2016    Past Medical History:  Diagnosis Date  . Benign colon polyp   . Cataracts, bilateral   . Diabetes mellitus   . Diarrhea   . Gastroparesis   . Intestinal bacterial overgrowth   . SBO (small bowel obstruction) (HCC)     Past Surgical History:  Procedure Laterality  Date  . ABDOMINAL HYSTERECTOMY    . APPENDECTOMY    . BREAST SURGERY     biopsy  . CATARACT EXTRACTION, BILATERAL    . ILEOCECETOMY  10/07/2013   Procedure: Ferne CoeILEOCECETOMY;  Surgeon: Axel FillerArmando Ramirez, MD;  [facility];  Service: General;;  . LAPAROTOMY N/A 10/07/2013   Procedure: EXPLORATORY LAPAROTOMY;  Surgeon: Axel FillerArmando Ramirez, MD;  [facility];  Service: General;  Laterality: N/A;  . LYSIS OF ADHESION  10/07/2013   Procedure: LYSIS OF ADHESION;  Surgeon: Axel FillerArmando Ramirez, MD;  Location: [facility]  OR;  Service: General;;  . resection of lymph node in neck    . ROTATOR CUFF REPAIR     right side    Family History  Problem Relation Age of Onset  . Colon polyps Father   . Heart disease Father   . Diabetes Father   . Diabetes Mother   . Heart disease Mother   . Heart disease Brother   . Colon cancer Neg Hx   . Rectal cancer Neg Hx   . Stomach cancer Neg Hx   . Esophageal cancer Neg Hx     Social History   Social History  . Marital status: Married    Spouse name: N/A  . Number of children: N/A  . Years of education: N/A   Occupational History  . Not on file.   Social History Main Topics  . Smoking status: Never Smoker  . Smokeless tobacco: Never Used  . Alcohol use 0.0 oz/week     Comment: one to a few times monthly  . Drug use: No  . Sexual activity: Not on file   Other Topics Concern  . Not on file   Social History Narrative  . No narrative on file      Review of systems: Review of Systems  Constitutional: Negative for fever and chills.  HENT: Negative.  Jaw pain on chewing at times Eyes: Negative for blurred vision.  Respiratory: Negative for cough, shortness of breath and wheezing.   Cardiovascular: Negative for chest pain and palpitations.  Gastrointestinal: as per HPI Genitourinary: Negative for dysuria, urgency, frequency and hematuria.  Musculoskeletal: Positive for myalgias, back pain and joint pain.  Skin: Negative for itching and rash.  Neurological: Negative for dizziness, tremors, focal weakness, seizures and loss of consciousness.  Endo/Heme/Allergies: Positive for seasonal allergies.  Psychiatric/Behavioral: Negative for depression, suicidal ideas and hallucinations.  All other systems reviewed and are negative.   Physical Exam: Vitals:   05/17/16 0825  BP: 104/70  Pulse: 62   Body mass index is 19.94 kg/m. Gen:      No acute distress HEENT:  EOMI, sclera anicteric Neck:     No masses; no thyromegaly Lungs:    Clear to  auscultation bilaterally; normal respiratory effort CV:         Regular rate and rhythm; no murmurs Abd:      + bowel sounds; soft, non-tender; no palpable masses, no distension Ext:    No edema; adequate peripheral perfusion Skin:      Warm and dry; no rash Neuro: alert and oriented x 3 Psych: normal mood and affect  Data Reviewed:Reviewed chart in epic   Assessment and Plan/Recommendations:  65 year old female status post appendectomy and abdominal hysterectomy, high-grade SBO in January 2015 status post ex-lap, ileo cecectomy and lysis of adhesions here with complaints of generalized abdominal bloating and cramping worse  2 -3 hours postprandial  Though she has chart history of gastroparesis, her symptoms  are not very consistent with that, if patient has partial small bowel obstruction as she likely did in the past. Gastric emptying scan study can be erroneously positive given the slow small bowel transit Advise patient to avoid excessive fiber and increase fluid intake We will do a course of rifaximin for possible small intestinal bacterial overgrowth Use IB Guard 1-2 capsules twice a day as needed for abdominal cramps Obtain small bowel follow-through series to delineate any areas of partial obstruction 25 minutes was spent face-to-face with the patient. Greater than 50% of the time used for counseling as well as treatment plan and follow-up. She had multiple questions which were answered to her satisfaction  K. Scherry RanVeena Shamiyah Ngu , MD [phone] Mon-Fri 8a-5p [phone] after 5p, weekends, holidays  CC: Tisovec, Adelfa Kohichard W, MD

## 2016-05-25 ENCOUNTER — Other Ambulatory Visit: Payer: Self-pay

## 2016-05-25 ENCOUNTER — Telehealth: Payer: Self-pay | Admitting: Gastroenterology

## 2016-05-25 ENCOUNTER — Ambulatory Visit (HOSPITAL_COMMUNITY): Payer: Commercial Managed Care - HMO

## 2016-05-25 MED ORDER — VSL#3 PO CAPS: 1.0000 | ORAL_CAPSULE | Freq: Every day | ORAL | 11 refills | Status: AC

## 2016-05-25 NOTE — Telephone Encounter (Signed)
If she is doing better, can hold off small bowel follow . Thanks

## 2016-05-25 NOTE — Telephone Encounter (Signed)
Patient is aware. Rx to her pharmacy to see if there will be any insurance coverage or if it will be in her price range.

## 2016-05-25 NOTE — Telephone Encounter (Signed)
She can continue the align or if she would like to try different probiotic, please recommend her to start VSL#3, 11 2 billion units 1 capsule daily

## 2016-05-25 NOTE — Telephone Encounter (Signed)
She is on a probiotic and a course of Xifaxan. She says she feels considerably better. Does she still need the UGI with sbft?

## 2016-05-25 NOTE — Telephone Encounter (Signed)
Also, she is on Align. She cannot remember what probiotic you had spoken of. Please advise.

## 2016-05-30 ENCOUNTER — Ambulatory Visit (HOSPITAL_COMMUNITY): Payer: Commercial Managed Care - HMO

## 2016-06-02 ENCOUNTER — Telehealth: Payer: Self-pay | Admitting: Gastroenterology

## 2016-06-02 NOTE — Telephone Encounter (Signed)
The patient would like us to contact her insurance about why we want her to take the VSL #3. She is already on Align.

## 2016-06-03 NOTE — Telephone Encounter (Signed)
Patient wants us to contact her insurance to try to get it approved.

## 2016-06-07 NOTE — Telephone Encounter (Signed)
[facility]

## 2016-07-21 ENCOUNTER — Telehealth: Payer: Self-pay | Admitting: Gastroenterology

## 2016-07-21 ENCOUNTER — Other Ambulatory Visit: Payer: Self-pay

## 2016-07-21 MED ORDER — RIFAXIMIN 550 MG PO TABS: 550.0000 mg | ORAL_TABLET | Freq: Three times a day (TID) | ORAL | 0 refills | Status: DC

## 2016-07-21 NOTE — Telephone Encounter (Signed)
Patient notified

## 2016-07-21 NOTE — Telephone Encounter (Signed)
Patient calls. She states she has been well and symptom free until 2-3 days ago. Abdominal discomfort, stomach cramps and multiple diarrhea stools. She feels slightly nauseated. She is on daily Align. She has taken IBgard today without relief. She is asking to repeat a course of Xifaxan. Afebrile. No blood in her stools.

## 2016-07-21 NOTE — Telephone Encounter (Signed)
Patient confirms she did not hear back from her insurance about coverage for VSL#3

## 2016-07-21 NOTE — Telephone Encounter (Signed)
Advise her to discontinue Align for 4 weeks. Avoid excessive insoluble fiber, ok to continue soluble fiber (benefiber) if she is taking it. Ok to do one more course of Xifaxan for 14 days. Thanks

## 2016-07-21 NOTE — Telephone Encounter (Signed)
Rx sent to [facility]

## 2016-07-29 ENCOUNTER — Encounter: Payer: Self-pay | Admitting: Gastroenterology

## 2016-07-29 ENCOUNTER — Ambulatory Visit (INDEPENDENT_AMBULATORY_CARE_PROVIDER_SITE_OTHER): Payer: Commercial Managed Care - HMO | Admitting: Gastroenterology

## 2016-07-29 VITALS — BP 96/56 | HR 68 | Ht 61.5 in | Wt 108.0 lb

## 2016-07-29 DIAGNOSIS — K641 Second degree hemorrhoids: Secondary | ICD-10-CM

## 2016-07-29 NOTE — Progress Notes (Signed)
PROCEDURE NOTE: The patient presents with symptomatic grade II  hemorrhoids, requesting rubber band ligation of his/her hemorrhoidal disease.  All risks, benefits and alternative forms of therapy were described and informed consent was obtained.  In the Left Lateral Decubitus position anoscopic examination revealed grade II hemorrhoids in the right anterior, right posterior and left lateral position(s).  The anorectum was pre-medicated with 0.125% nitroglycerin and recticare The decision was made to band the right anterior internal hemorrhoid, and the [facility] System was used to perform band ligation without complication.  Digital anorectal examination was then performed to assure proper positioning of the band, and to adjust the banded tissue as required.  The patient was discharged home without pain or other issues.  Dietary and behavioral recommendations were given and along with follow-up instructions.     The following adjunctive treatments were recommended: Recti care small amount per rectum for next 2-3 days as needed  The patient will return in 2-4 weeks for  follow-up and possible additional banding as required. No complications were encountered and the patient tolerated the procedure well.  Iona BeardK. Veena Yen Wandell , MD [phone] Mon-Fri 8a-5p [phone] after 5p, weekends, holidays

## 2016-07-29 NOTE — Patient Instructions (Signed)

## 2016-09-28 ENCOUNTER — Encounter: Payer: Commercial Managed Care - HMO | Admitting: Gastroenterology

## 2016-10-03 HISTORY — PX: YAG LASER APPLICATION: SHX6189

## 2016-10-12 ENCOUNTER — Encounter: Payer: Self-pay | Admitting: Gastroenterology

## 2016-10-12 ENCOUNTER — Ambulatory Visit (INDEPENDENT_AMBULATORY_CARE_PROVIDER_SITE_OTHER): Payer: Commercial Managed Care - HMO | Admitting: Gastroenterology

## 2016-10-12 VITALS — BP 94/50 | HR 60 | Ht 61.5 in | Wt 111.2 lb

## 2016-10-12 DIAGNOSIS — K641 Second degree hemorrhoids: Secondary | ICD-10-CM | POA: Diagnosis not present

## 2016-10-12 NOTE — Progress Notes (Signed)
PROCEDURE NOTE: The patient presents with symptomatic grade II hemorrhoids, requesting rubber band ligation of his/her hemorrhoidal disease.  All risks, benefits and alternative forms of therapy were described and informed consent was obtained.   The anorectum was pre-medicated with 0.125% Nitroglycerine and Recticare The decision was made to band the Right posterior internal hemorrhoid, and the [facility] O'Regan System was used to perform band ligation without complication.  Digital anorectal examination was then performed to assure proper positioning of the band, and to adjust the banded tissue as required.  The patient was discharged home without pain or other issues.  Dietary and behavioral recommendations were given and along with follow-up instructions.      The patient will return in 2-4 weeks for  follow-up and possible additional banding as required. No complications were encountered and the patient tolerated the procedure well.  K. Veena Lillyian Heidt , MD [phone] Mon-Fri 8a-5p [phone] after 5p, weekends, holidays  

## 2016-10-12 NOTE — Patient Instructions (Signed)

## 2016-10-17 ENCOUNTER — Ambulatory Visit
Admission: RE | Admit: 2016-10-17 | Discharge: 2016-10-17 | Disposition: A | Discharge status: Home / Self Care | Payer: Commercial Managed Care - HMO | Source: Ambulatory Visit | Attending: Otolaryngology | Admitting: Otolaryngology

## 2016-10-17 ENCOUNTER — Other Ambulatory Visit (INDEPENDENT_AMBULATORY_CARE_PROVIDER_SITE_OTHER): Payer: Self-pay | Admitting: Otolaryngology

## 2016-10-17 DIAGNOSIS — J329 Chronic sinusitis, unspecified: Secondary | ICD-10-CM

## 2016-11-04 ENCOUNTER — Other Ambulatory Visit: Payer: Self-pay | Admitting: Otolaryngology

## 2016-11-15 NOTE — Progress Notes (Signed)
Reviewed pt's procedure time and med/ insulin pump with Janine, Diabetes Coordinator. Surgery to be moved to main OR so that an Insulin Drip can be used if necessary.  Herbert SetaHeather, Dr Avel Sensoreoh's surgery scheduler notified.

## 2016-11-22 ENCOUNTER — Encounter: Payer: Commercial Managed Care - HMO | Admitting: Gastroenterology

## 2016-12-21 ENCOUNTER — Encounter (HOSPITAL_COMMUNITY)
Admission: RE | Admit: 2016-12-21 | Discharge: 2016-12-21 | Disposition: A | Discharge status: Home / Self Care | Payer: Commercial Managed Care - HMO | Source: Ambulatory Visit | Attending: Otolaryngology | Admitting: Otolaryngology

## 2016-12-21 ENCOUNTER — Encounter (HOSPITAL_COMMUNITY): Payer: Self-pay

## 2016-12-21 DIAGNOSIS — I498 Other specified cardiac arrhythmias: Secondary | ICD-10-CM | POA: Insufficient documentation

## 2016-12-21 DIAGNOSIS — J320 Chronic maxillary sinusitis: Secondary | ICD-10-CM | POA: Diagnosis not present

## 2016-12-21 DIAGNOSIS — J342 Deviated nasal septum: Secondary | ICD-10-CM | POA: Diagnosis not present

## 2016-12-21 DIAGNOSIS — E119 Type 2 diabetes mellitus without complications: Secondary | ICD-10-CM | POA: Diagnosis not present

## 2016-12-21 DIAGNOSIS — R001 Bradycardia, unspecified: Secondary | ICD-10-CM | POA: Insufficient documentation

## 2016-12-21 DIAGNOSIS — Z01810 Encounter for preprocedural cardiovascular examination: Secondary | ICD-10-CM | POA: Diagnosis present

## 2016-12-21 DIAGNOSIS — Z01812 Encounter for preprocedural laboratory examination: Secondary | ICD-10-CM | POA: Diagnosis present

## 2016-12-21 DIAGNOSIS — J322 Chronic ethmoidal sinusitis: Secondary | ICD-10-CM | POA: Insufficient documentation

## 2016-12-21 HISTORY — DX: Headache, unspecified: R51.9

## 2016-12-21 HISTORY — DX: Anxiety disorder, unspecified: F41.9

## 2016-12-21 HISTORY — DX: Unspecified osteoarthritis, unspecified site: M19.90

## 2016-12-21 HISTORY — DX: Nausea with vomiting, unspecified: R11.2

## 2016-12-21 HISTORY — DX: Other specified postprocedural states: Z98.890

## 2016-12-21 HISTORY — DX: Headache: R51

## 2016-12-21 HISTORY — DX: Major depressive disorder, single episode, unspecified: F32.9

## 2016-12-21 HISTORY — DX: Depression, unspecified: F32.A

## 2016-12-21 LAB — BASIC METABOLIC PANEL
Anion gap: 10 (ref 5–15)
BUN: 13 mg/dL (ref 6–20)
CHLORIDE: 100 mmol/L — AB (ref 101–111)
CO2: 26 mmol/L (ref 22–32)
CREATININE: 0.7 mg/dL (ref 0.44–1.00)
Calcium: 10.2 mg/dL (ref 8.9–10.3)
GFR calc Af Amer: >60 mL/min (ref >60)
GFR calc non Af Amer: >60 mL/min (ref >60)
GLUCOSE: 139 mg/dL — AB (ref 65–99)
POTASSIUM: 5.1 mmol/L (ref 3.5–5.1)
SODIUM: 136 mmol/L (ref 135–145)

## 2016-12-21 LAB — CBC
HEMATOCRIT: 38.7 % (ref 36.0–46.0)
Hemoglobin: 12.9 g/dL (ref 12.0–15.0)
MCH: 28.7 pg (ref 26.0–34.0)
MCHC: 33.3 g/dL (ref 30.0–36.0)
MCV: 86 fL (ref 78.0–100.0)
PLATELETS: 245 10*3/uL (ref 150–400)
RBC: 4.5 MIL/uL (ref 3.87–5.11)
RDW: 13.7 % (ref 11.5–15.5)
WBC: 5.8 10*3/uL (ref 4.0–10.5)

## 2016-12-21 LAB — GLUCOSE, CAPILLARY: GLUCOSE-CAPILLARY: 171 mg/dL — AB (ref 65–99)

## 2016-12-21 NOTE — Pre-Procedure Instructions (Signed)
[facility]  12/21/2016      [id number] [place], [place] - [address] [place] [address] Phone: [phone] Fax: [phone]    Your procedure is scheduled on Wednesday March 28.  Report to [facility] Admitting at 5:30 A.M.  Call this number if you have problems the morning of surgery:  [phone]   Remember:  Do not eat food or drink liquids after midnight.  Take these medicines the morning of surgery with A SIP OF WATER: tramadol (ultram) if needed,xanax if needed, flonase,  STOP taking anything with pseudoephedrine in it (Sudafed)  7 days prior to surgery STOP taking any Aspirin, Aleve, Naproxen, Ibuprofen, Motrin, Advil, Goody's, BC's, all herbal medications, fish oil, and all vitamins   WHAT DO I DO ABOUT MY DIABETES MEDICATION?   . INSULIN PUMP (Insulin Human): Reduce basal rate by 20% at midnight the night before surgery, or contact the doctor that manages your diabetes for instructions.   . Do not take oral diabetes medicines (pills) the morning of surgery.  . The day of surgery, do not take other diabetes injectables, including Byetta (exenatide), Bydureon (exenatide ER), Victoza (liraglutide), or Trulicity (dulaglutide).  . If your CBG is greater than 220 mg/dL, you may take  of your sliding scale (correction) dose of insulin.    How to Manage Your Diabetes Before and After Surgery  Why is it important to control my blood sugar before and after surgery? . Improving blood sugar levels before and after surgery helps healing and can limit problems. . A way of improving blood sugar control is eating a healthy diet by: o  Eating less sugar and carbohydrates o  Increasing activity/exercise o  Talking with your doctor about reaching your blood sugar goals . High blood sugars (greater than 180 mg/dL) can raise your risk of infections and slow your recovery, so you will need to focus on controlling your  diabetes during the weeks before surgery. . Make sure that the doctor who takes care of your diabetes knows about your planned surgery including the date and location.  How do I manage my blood sugar before surgery? . Check your blood sugar at least 4 times a day, starting 2 days before surgery, to make sure that the level is not too high or low. o Check your blood sugar the morning of your surgery when you wake up and every 2 hours until you get to the Short Stay unit. . If your blood sugar is less than 70 mg/dL, you will need to treat for low blood sugar: o Do not take insulin. o Treat a low blood sugar (less than 70 mg/dL) with  cup of clear juice (cranberry or apple), 4 glucose tablets, OR glucose gel. o Recheck blood sugar in 15 minutes after treatment (to make sure it is greater than 70 mg/dL). If your blood sugar is not greater than 70 mg/dL on recheck, call [phone] for further instructions. . Report your blood sugar to the short stay nurse when you get to Short Stay.  . If you are admitted to the hospital after surgery: o Your blood sugar will be checked by the staff and you will probably be given insulin after surgery (instead of oral diabetes medicines) to make sure you have good blood sugar levels. o The goal for blood sugar control after surgery is 80-180 mg/dL.  Do not wear jewelry, make-up or nail polish.  Do not wear lotions, powders, or perfumes, or deoderant.  Do not shave 48 hours prior to surgery.  Men may shave face and neck.  Do not bring valuables to the hospital.  [facility] is not responsible for any belongings or valuables.  Contacts, dentures or bridgework may not be worn into surgery.  Leave your suitcase in the car.  After surgery it may be brought to your room.  For patients admitted to the hospital, discharge time will be determined by your treatment team.  Patients discharged the day of surgery will not be allowed to drive  home.    Special instructions:    [place]- Preparing For Surgery  Before surgery, you can play an important role. Because skin is not sterile, your skin needs to be as free of germs as possible. You can reduce the number of germs on your skin by washing with CHG (chlorahexidine gluconate) Soap before surgery.  CHG is an antiseptic cleaner which kills germs and bonds with the skin to continue killing germs even after washing.  Please do not use if you have an allergy to CHG or antibacterial soaps. If your skin becomes reddened/irritated stop using the CHG.  Do not shave (including legs and underarms) for at least 48 hours prior to first CHG shower. It is OK to shave your face.  Please follow these instructions carefully.   1. Shower the NIGHT BEFORE SURGERY and the MORNING OF SURGERY with CHG.   2. If you chose to wash your hair, wash your hair first as usual with your normal shampoo.  3. After you shampoo, rinse your hair and body thoroughly to remove the shampoo.  4. Use CHG as you would any other liquid soap. You can apply CHG directly to the skin and wash gently with a scrungie or a clean washcloth.   5. Apply the CHG Soap to your body ONLY FROM THE NECK DOWN.  Do not use on open wounds or open sores. Avoid contact with your eyes, ears, mouth and genitals (private parts). Wash genitals (private parts) with your normal soap.  6. Wash thoroughly, paying special attention to the area where your surgery will be performed.  7. Thoroughly rinse your body with warm water from the neck down.  8. DO NOT shower/wash with your normal soap after using and rinsing off the CHG Soap.  9. Pat yourself dry with a CLEAN TOWEL.   10. Wear CLEAN PAJAMAS   11. Place CLEAN SHEETS on your bed the night of your first shower and DO NOT SLEEP WITH PETS.    Day of Surgery: Do not apply any deodorants/lotions. Please wear clean clothes to the hospital/surgery center.

## 2016-12-21 NOTE — Progress Notes (Addendum)
PCP: Dr. Toy Cookeyichard Tisovec-he manages pt's diabetes  Fasting sugars 100 Pt. Reports she is on diovan not for blood pressure,but it helps with management of diabetes.  Notified diabetes coordinator, Beryl MeagerJenny Simpson R.N. Of pt's surgery date and time.

## 2016-12-22 LAB — HEMOGLOBIN A1C
Hgb A1c MFr Bld: 6.6 % — ABNORMAL HIGH (ref 4.8–5.6)
Mean Plasma Glucose: 143 mg/dL

## 2016-12-22 NOTE — Progress Notes (Signed)
Anesthesia chart review: Patient is a 66 year old female scheduled for septoplasty, bilateral turbinate reduction, bilateral endoscopic maxillary antrostomy with tissue removal, bilateral total ethmoidectomy on 12/28/2016 by Dr. Suszanne Connerseoh.  History includes never smoker, DM1 (insulin pump), gastroparesis, post-operative N/V, anxiety, depression, headaches, hysterectomy, appendectomy, SBO (from volvulus from adhesions) s/p exploratory laparotomy with LOA/ileocecetomy 10/07/13.   PCP is Dr. Guerry Bruinichard Tisovec. He manages her insulin pump.  Meds include Xanax, Diovan, Flonase, Levsin SL, insulin pump, magnesium oxide, ubiquinol CoQ10, Sudafed (on hold).  BP (!) 128/57   Pulse (!) 55   Temp 36.7 C   Resp 18   Ht 5\' 2"  (1.575 m)   Wt 111 lb 1.8 oz (50.4 kg)   SpO2 100%   BMI 20.32 kg/m   EKG 12/21/16: SB at 51 bpm, marked sinus arrhythmia, low voltage QRS.  Preoperative labs noted. CBC within normal limits. Creatinine 0.70. Glucose 139. A1c 6.6.  If no acute changes then I anticipate that she can proceed as planned. She does have an insulin pump. She is to reduce basal rate by 20% at MN the night before surgery unless otherwise instructed by Dr. Wylene Simmerisovec. DM coordinator notified of patient's surgery date/time by PAT RN. OR room is booked for 2 hours.  Velna Ochsllison Shaunessy Dobratz, PA-C [facility]esthesiology Phone [phone] 12/22/2016 5:25 PM

## 2016-12-27 ENCOUNTER — Encounter (HOSPITAL_COMMUNITY): Payer: Self-pay | Admitting: Anesthesiology

## 2016-12-27 NOTE — Anesthesia Preprocedure Evaluation (Addendum)
Anesthesia Evaluation  Patient identified by MRN, date of birth, ID band Patient awake    Reviewed: Allergy & Precautions, NPO status , Patient's Chart, lab work & pertinent test results  History of Anesthesia Complications (+) PONV and history of anesthetic complications  Airway Mallampati: I  TM Distance: >3 FB Neck ROM: Full    Dental  (+) Teeth Intact, Dental Advisory Given   Pulmonary neg pulmonary ROS,    breath sounds clear to auscultation       Cardiovascular negative cardio ROS   Rhythm:Regular Rate:Normal     Neuro/Psych  Headaches, PSYCHIATRIC DISORDERS Anxiety Depression    GI/Hepatic negative GI ROS, Neg liver ROS,   Endo/Other  diabetes, Type 2, Insulin Dependent  Renal/GU negative Renal ROS  negative genitourinary   Musculoskeletal  (+) Arthritis ,   Abdominal   Peds negative pediatric ROS (+)  Hematology negative hematology ROS (+)   Anesthesia Other Findings Day of surgery medications reviewed with the patient.  Reproductive/Obstetrics negative OB ROS                            Anesthesia Physical Anesthesia Plan  ASA: II  Anesthesia Plan: General   Post-op Pain Management:    Induction: Intravenous  Airway Management Planned: Oral ETT  Additional Equipment:   Intra-op Plan:   Post-operative Plan: Extubation in OR  Informed Consent: I have reviewed the patients History and Physical, chart, labs and discussed the procedure including the risks, benefits and alternatives for the proposed anesthesia with the patient or authorized representative who has indicated his/her understanding and acceptance.   Dental advisory given  Plan Discussed with:   Anesthesia Plan Comments: (Suspend insulin pump during case. Check blood sugar q45 min. )       Anesthesia Quick Evaluation

## 2016-12-28 ENCOUNTER — Encounter (HOSPITAL_COMMUNITY): Payer: Self-pay | Admitting: *Deleted

## 2016-12-28 ENCOUNTER — Ambulatory Visit (HOSPITAL_COMMUNITY)
Admission: RE | Admit: 2016-12-28 | Discharge: 2016-12-28 | Disposition: A | Discharge status: Home / Self Care | Payer: Commercial Managed Care - HMO | Source: Ambulatory Visit | Attending: Otolaryngology | Admitting: Otolaryngology

## 2016-12-28 ENCOUNTER — Encounter (HOSPITAL_COMMUNITY)
Admission: RE | Disposition: A | Discharge status: Home / Self Care | Payer: Self-pay | Source: Ambulatory Visit | Attending: Otolaryngology

## 2016-12-28 ENCOUNTER — Ambulatory Visit (HOSPITAL_COMMUNITY): Payer: Commercial Managed Care - HMO | Admitting: Anesthesiology

## 2016-12-28 ENCOUNTER — Ambulatory Visit (HOSPITAL_COMMUNITY): Payer: Commercial Managed Care - HMO | Admitting: Vascular Surgery

## 2016-12-28 DIAGNOSIS — J328 Other chronic sinusitis: Secondary | ICD-10-CM | POA: Insufficient documentation

## 2016-12-28 DIAGNOSIS — J342 Deviated nasal septum: Secondary | ICD-10-CM | POA: Diagnosis not present

## 2016-12-28 DIAGNOSIS — E119 Type 2 diabetes mellitus without complications: Secondary | ICD-10-CM | POA: Diagnosis not present

## 2016-12-28 DIAGNOSIS — Z79899 Other long term (current) drug therapy: Secondary | ICD-10-CM | POA: Insufficient documentation

## 2016-12-28 DIAGNOSIS — J3489 Other specified disorders of nose and nasal sinuses: Secondary | ICD-10-CM | POA: Diagnosis not present

## 2016-12-28 DIAGNOSIS — F419 Anxiety disorder, unspecified: Secondary | ICD-10-CM | POA: Insufficient documentation

## 2016-12-28 DIAGNOSIS — J338 Other polyp of sinus: Secondary | ICD-10-CM | POA: Diagnosis not present

## 2016-12-28 DIAGNOSIS — J343 Hypertrophy of nasal turbinates: Secondary | ICD-10-CM | POA: Insufficient documentation

## 2016-12-28 DIAGNOSIS — Z794 Long term (current) use of insulin: Secondary | ICD-10-CM | POA: Diagnosis not present

## 2016-12-28 DIAGNOSIS — J329 Chronic sinusitis, unspecified: Secondary | ICD-10-CM | POA: Diagnosis present

## 2016-12-28 HISTORY — PX: NASAL SEPTOPLASTY W/ TURBINOPLASTY: SHX2070

## 2016-12-28 HISTORY — PX: SINUS ENDO WITH FUSION: SHX5329

## 2016-12-28 LAB — GLUCOSE, CAPILLARY
GLUCOSE-CAPILLARY: 182 mg/dL — AB (ref 65–99)
GLUCOSE-CAPILLARY: 155 mg/dL — AB (ref 65–99)
GLUCOSE-CAPILLARY: 106 mg/dL — AB (ref 65–99)
Glucose-Capillary: 261 mg/dL — ABNORMAL HIGH (ref 65–99)
Glucose-Capillary: 117 mg/dL — ABNORMAL HIGH (ref 65–99)

## 2016-12-28 SURGERY — SEPTOPLASTY, NOSE, WITH NASAL TURBINATE REDUCTION
Anesthesia: General | Site: Nose | Laterality: Bilateral

## 2016-12-28 MED ORDER — LACTATED RINGERS IV SOLN: INTRAVENOUS | Status: DC

## 2016-12-28 MED ORDER — SUGAMMADEX SODIUM 200 MG/2ML IV SOLN
INTRAVENOUS | Status: DC | PRN
  Administered 2016-12-28: 100 mg via INTRAVENOUS

## 2016-12-28 MED ORDER — BACITRACIN ZINC 500 UNIT/GM EX OINT
TOPICAL_OINTMENT | CUTANEOUS | Status: DC | PRN
  Administered 2016-12-28: 1 via TOPICAL

## 2016-12-28 MED ORDER — MIDAZOLAM HCL 2 MG/2ML IJ SOLN
INTRAMUSCULAR | Status: AC
  Filled 2016-12-28: qty 2

## 2016-12-28 MED ORDER — LIDOCAINE 2% (20 MG/ML) 5 ML SYRINGE
INTRAMUSCULAR | Status: AC
  Filled 2016-12-28: qty 5

## 2016-12-28 MED ORDER — MEPERIDINE HCL 25 MG/ML IJ SOLN: 6.2500 mg | INTRAMUSCULAR | Status: DC | PRN

## 2016-12-28 MED ORDER — 0.9 % SODIUM CHLORIDE (POUR BTL) OPTIME
TOPICAL | Status: DC | PRN
  Administered 2016-12-28: 1000 mL

## 2016-12-28 MED ORDER — ONDANSETRON HCL 4 MG/2ML IJ SOLN
INTRAMUSCULAR | Status: DC | PRN
  Administered 2016-12-28 (×2): 4 mg via INTRAVENOUS

## 2016-12-28 MED ORDER — ROCURONIUM BROMIDE 50 MG/5ML IV SOSY
PREFILLED_SYRINGE | INTRAVENOUS | Status: AC
  Filled 2016-12-28: qty 5

## 2016-12-28 MED ORDER — OXYCODONE-ACETAMINOPHEN 5-325 MG PO TABS: 1.0000 | ORAL_TABLET | ORAL | 0 refills | Status: DC | PRN

## 2016-12-28 MED ORDER — OXYCODONE-ACETAMINOPHEN 5-325 MG PO TABS
1.0000 | ORAL_TABLET | Freq: Once | ORAL | Status: AC
  Administered 2016-12-28: 2 via ORAL

## 2016-12-28 MED ORDER — AMOXICILLIN 875 MG PO TABS: 875.0000 mg | ORAL_TABLET | Freq: Two times a day (BID) | ORAL | 0 refills | Status: AC

## 2016-12-28 MED ORDER — FENTANYL CITRATE (PF) 100 MCG/2ML IJ SOLN
INTRAMUSCULAR | Status: DC | PRN
  Administered 2016-12-28 (×4): 50 ug via INTRAVENOUS
  Administered 2016-12-28: 100 ug via INTRAVENOUS
  Administered 2016-12-28 (×2): 50 ug via INTRAVENOUS

## 2016-12-28 MED ORDER — CEFAZOLIN SODIUM 1 G IJ SOLR
INTRAMUSCULAR | Status: DC | PRN
  Administered 2016-12-28: 2 g via INTRAMUSCULAR

## 2016-12-28 MED ORDER — OXYMETAZOLINE HCL 0.05 % NA SOLN
NASAL | Status: DC | PRN
  Administered 2016-12-28: 1

## 2016-12-28 MED ORDER — SUGAMMADEX SODIUM 200 MG/2ML IV SOLN
INTRAVENOUS | Status: AC
  Filled 2016-12-28: qty 2

## 2016-12-28 MED ORDER — HYDROMORPHONE HCL 1 MG/ML IJ SOLN
INTRAMUSCULAR | Status: AC
Start: 2016-12-28 — End: 2016-12-28
  Administered 2016-12-28: 0.5 mg via INTRAVENOUS
  Filled 2016-12-28: qty 0.5

## 2016-12-28 MED ORDER — BACIT-POLY-NEO HC 1 % EX OINT
TOPICAL_OINTMENT | CUTANEOUS | Status: AC
  Filled 2016-12-28: qty 15

## 2016-12-28 MED ORDER — DOUBLE ANTIBIOTIC 500-10000 UNIT/GM EX OINT
TOPICAL_OINTMENT | CUTANEOUS | Status: AC
Start: 2016-12-28 — End: ?
  Filled 2016-12-28: qty 1

## 2016-12-28 MED ORDER — SODIUM CHLORIDE 0.9 % IR SOLN
Status: DC | PRN
  Administered 2016-12-28: 1000 mL

## 2016-12-28 MED ORDER — LACTATED RINGERS IV SOLN
INTRAVENOUS | Status: DC | PRN
  Administered 2016-12-28: 07:00:00 via INTRAVENOUS

## 2016-12-28 MED ORDER — OXYCODONE-ACETAMINOPHEN 5-325 MG PO TABS
ORAL_TABLET | ORAL | Status: AC
  Administered 2016-12-28: 2 via ORAL
  Filled 2016-12-28: qty 2

## 2016-12-28 MED ORDER — PHENYLEPHRINE HCL 10 MG/ML IJ SOLN
INTRAMUSCULAR | Status: DC | PRN
  Administered 2016-12-28: 40 ug via INTRAVENOUS

## 2016-12-28 MED ORDER — OXYMETAZOLINE HCL 0.05 % NA SOLN
NASAL | Status: AC
  Filled 2016-12-28: qty 15

## 2016-12-28 MED ORDER — DIPHENHYDRAMINE HCL 25 MG PO CAPS
25.0000 mg | ORAL_CAPSULE | Freq: Once | ORAL | Status: AC
  Administered 2016-12-28: 25 mg via ORAL
  Filled 2016-12-28 (×2): qty 1

## 2016-12-28 MED ORDER — HYDROMORPHONE HCL 1 MG/ML IJ SOLN
0.2500 mg | INTRAMUSCULAR | Status: DC | PRN
  Administered 2016-12-28 (×2): 0.5 mg via INTRAVENOUS

## 2016-12-28 MED ORDER — PHENYLEPHRINE 40 MCG/ML (10ML) SYRINGE FOR IV PUSH (FOR BLOOD PRESSURE SUPPORT)
PREFILLED_SYRINGE | INTRAVENOUS | Status: AC
  Filled 2016-12-28: qty 10

## 2016-12-28 MED ORDER — ONDANSETRON HCL 4 MG/2ML IJ SOLN
INTRAMUSCULAR | Status: AC
  Filled 2016-12-28: qty 2

## 2016-12-28 MED ORDER — FENTANYL CITRATE (PF) 250 MCG/5ML IJ SOLN
INTRAMUSCULAR | Status: AC
  Filled 2016-12-28: qty 5

## 2016-12-28 MED ORDER — PROPOFOL 10 MG/ML IV BOLUS
INTRAVENOUS | Status: DC | PRN
  Administered 2016-12-28: 120 mg via INTRAVENOUS

## 2016-12-28 MED ORDER — CEFAZOLIN SODIUM 1 G IJ SOLR
INTRAMUSCULAR | Status: AC
  Filled 2016-12-28: qty 20

## 2016-12-28 MED ORDER — PROPOFOL 10 MG/ML IV BOLUS
INTRAVENOUS | Status: AC
  Filled 2016-12-28: qty 40

## 2016-12-28 MED ORDER — HYDROMORPHONE HCL 1 MG/ML IJ SOLN
INTRAMUSCULAR | Status: AC
  Administered 2016-12-28: 0.5 mg via INTRAVENOUS
  Filled 2016-12-28: qty 0.5

## 2016-12-28 MED ORDER — SCOPOLAMINE 1 MG/3DAYS TD PT72
MEDICATED_PATCH | TRANSDERMAL | Status: DC | PRN
  Administered 2016-12-28: 1 via TRANSDERMAL

## 2016-12-28 MED ORDER — ROCURONIUM BROMIDE 100 MG/10ML IV SOLN
INTRAVENOUS | Status: DC | PRN
  Administered 2016-12-28: 40 mg via INTRAVENOUS
  Administered 2016-12-28 (×2): 10 mg via INTRAVENOUS

## 2016-12-28 MED ORDER — LIDOCAINE-EPINEPHRINE 2 %-1:100000 IJ SOLN
INTRAMUSCULAR | Status: AC
  Filled 2016-12-28: qty 1

## 2016-12-28 MED ORDER — MIDAZOLAM HCL 5 MG/5ML IJ SOLN
INTRAMUSCULAR | Status: DC | PRN
  Administered 2016-12-28: 2 mg via INTRAVENOUS

## 2016-12-28 MED ORDER — PHENYLEPHRINE HCL 10 MG/ML IJ SOLN
INTRAVENOUS | Status: DC | PRN
  Administered 2016-12-28: 25 ug/min via INTRAVENOUS

## 2016-12-28 MED ORDER — LIDOCAINE HCL (CARDIAC) 20 MG/ML IV SOLN
INTRAVENOUS | Status: DC | PRN
  Administered 2016-12-28: 60 mg via INTRAVENOUS

## 2016-12-28 MED ORDER — LIDOCAINE-EPINEPHRINE (PF) 2 %-1:200000 IJ SOLN
INTRAMUSCULAR | Status: DC | PRN
  Administered 2016-12-28: 2.5 mL

## 2016-12-28 MED ORDER — PROMETHAZINE HCL 25 MG/ML IJ SOLN: 6.2500 mg | INTRAMUSCULAR | Status: DC | PRN

## 2016-12-28 SURGICAL SUPPLY — 46 items
BLADE 10 SAFETY STRL DISP (BLADE) ×2 IMPLANT
BLADE RAD40 ROTATE 4M 4 5PK (BLADE) IMPLANT
BLADE RAD60 ROTATE M4 4 5PK (BLADE) IMPLANT
BLADE ROTATE TRICUT 4X13 M4 (BLADE) ×2 IMPLANT
BLADE SURG 15 STRL LF DISP TIS (BLADE) IMPLANT
BLADE SURG 15 STRL SS (BLADE)
BLADE TRICUT ROTATE M4 4 5PK (BLADE) ×2 IMPLANT
CANISTER SUCT 3000ML PPV (MISCELLANEOUS) ×2 IMPLANT
COAGULATOR SUCT SWTCH 10FR 6 (ELECTROSURGICAL) ×2 IMPLANT
CONT SPEC 4OZ CLIKSEAL STRL BL (MISCELLANEOUS) ×2 IMPLANT
COVER SURGICAL LIGHT HANDLE (MISCELLANEOUS) IMPLANT
DRAPE PROXIMA HALF (DRAPES) IMPLANT
ELECT REM PT RETURN 9FT ADLT (ELECTROSURGICAL) ×2
ELECTRODE REM PT RTRN 9FT ADLT (ELECTROSURGICAL) ×1 IMPLANT
FLOSEAL 10ML (HEMOSTASIS) IMPLANT
GAUZE SPONGE 2X2 8PLY STRL LF (GAUZE/BANDAGES/DRESSINGS) ×1 IMPLANT
GLOVE BIOGEL PI IND STRL 7.0 (GLOVE) ×1 IMPLANT
GLOVE BIOGEL PI INDICATOR 7.0 (GLOVE) ×1
GLOVE ECLIPSE 7.5 STRL STRAW (GLOVE) ×2 IMPLANT
GLOVE SURG SS PI 6.5 STRL IVOR (GLOVE) ×2 IMPLANT
GLOVE SURG SS PI 7.5 STRL IVOR (GLOVE) ×2 IMPLANT
GOWN STRL REUS W/ TWL LRG LVL3 (GOWN DISPOSABLE) ×2 IMPLANT
GOWN STRL REUS W/TWL LRG LVL3 (GOWN DISPOSABLE) ×2
KIT BASIN OR (CUSTOM PROCEDURE TRAY) ×2 IMPLANT
KIT ROOM TURNOVER OR (KITS) ×2 IMPLANT
NEEDLE HYPO 25GX1X1/2 BEV (NEEDLE) ×2 IMPLANT
NEEDLE SPNL 25GX3.5 QUINCKE BL (NEEDLE) ×2 IMPLANT
NS IRRIG 1000ML POUR BTL (IV SOLUTION) ×2 IMPLANT
PAD ARMBOARD 7.5X6 YLW CONV (MISCELLANEOUS) ×4 IMPLANT
SHEATH ENDOSCRUB 0 DEG (SHEATH) ×2 IMPLANT
SPLINT NASAL DOYLE BI-VL (GAUZE/BANDAGES/DRESSINGS) ×2 IMPLANT
SPONGE GAUZE 2X2 STER 10/PKG (GAUZE/BANDAGES/DRESSINGS) ×1
SPONGE NEURO XRAY DETECT 1X3 (DISPOSABLE) ×2 IMPLANT
SUT CHROMIC 3 0 SH 27 (SUTURE) ×2 IMPLANT
SUT CHROMIC 4 0 SH 27 (SUTURE) ×2 IMPLANT
SUT PLAIN 4 0 ~~LOC~~ 1 (SUTURE) ×2 IMPLANT
SUT PROLENE 2 0 FS (SUTURE) ×2 IMPLANT
TOWEL OR 17X24 6PK STRL BLUE (TOWEL DISPOSABLE) ×2 IMPLANT
TRACKER ENT INSTRUMENT (MISCELLANEOUS) ×2 IMPLANT
TRACKER ENT PATIENT (MISCELLANEOUS) ×2 IMPLANT
TRAY ENT MC OR (CUSTOM PROCEDURE TRAY) ×2 IMPLANT
TUBE CONNECTING 12X1/4 (SUCTIONS) ×2 IMPLANT
TUBE SALEM SUMP 16 FR W/ARV (TUBING) ×2 IMPLANT
TUBING EXTENTION W/L.L. (IV SETS) ×2 IMPLANT
TUBING STRAIGHTSHOT EPS 5PK (TUBING) ×2 IMPLANT
WATER STERILE IRR 1000ML POUR (IV SOLUTION) ×2 IMPLANT

## 2016-12-28 NOTE — Op Note (Signed)
DATE OF PROCEDURE: 12/28/2016  OPERATIVE REPORT   SURGEON: Newman PiesSu Lysha Schrade, MD   PREOPERATIVE DIAGNOSES:  1. Severe nasal septal deviation.  2. Bilateral inferior turbinate hypertrophy.  3. Chronic nasal obstruction. 4. Bilateral chronic maxillary and ethmoid sinusitis. 5. Bilateral sinonasal polyposis.  POSTOPERATIVE DIAGNOSES:  1. Severe nasal septal deviation.  2. Bilateral inferior turbinate hypertrophy.  3. Chronic nasal obstruction. 4. Bilateral chronic maxillary and ethmoid sinusitis. 5. Bilateral sinonasal polyposis.  PROCEDURE PERFORMED:  1. Septoplasty.  2. Bilateral partial inferior turbinate resection.  3. Bilateral endoscopic total ethmoidectomy. 4. Bilateral endoscopic maxillary antrostomy with tissue removal. 5. FUSION stereotactic image guidance.  ANESTHESIA: General endotracheal tube anesthesia.   COMPLICATIONS: None.   ESTIMATED BLOOD LOSS: 150 mL.   INDICATION FOR PROCEDURE: Taylor Adams is a 66 y.o. female with a history of chronic rhinosinusitis and chronic nasal obstruction. The patient was  previously treated with multiple courses of antibiotics, decongestant, antihistamine, OTC allergy medications, and steroid nasal sprays. However, the patient continued to be symptomatic. On examination, the patient was noted to have bilateral severe inferior turbinate hypertrophy and significant nasal septal deviation, causing significant nasal obstruction. On her CT scan, the patient was noted to have opacification of her bilateral maxillary and ethmoid sinuses. The ostiomeatal complexes were occluded with mucosal edema and polypoid tissue. Based on the above findings, the decision was made for the patient to undergo the above-stated procedures. The risks, benefits, alternatives, and details of the procedures were discussed with the patient. Questions were invited and answered. Informed consent was obtained.   DESCRIPTION OF PROCEDURE: The patient was taken to the operating room  and placed supine on the operating table. General endotracheal tube anesthesia was administered by the anesthesiologist. The patient was positioned, and prepped and draped in the standard fashion for nasal surgery. The FUSION stereotactic image guidance marker was placed. The image guidance system was functional throughout the case. Pledgets soaked with Afrin were placed in both nasal cavities for decongestion. The pledgets were subsequently removed. The above mentioned severe septal deviation was again noted. 1% lidocaine with 1:100,000 epinephrine was injected onto the nasal septum bilaterally. A hemitransfixion incision was made on the left side. The mucosal flap was carefully elevated on the left side. A cartilaginous incision was made 1 cm superior to the caudal margin of the nasal septum. Mucosal flap was also elevated on the right side in the similar fashion. It should be noted that due to the severe septal deviation, the deviated portion of the cartilaginous and bony septum had to be removed in piecemeal fashion. Once the deviated portions were removed, a straight midline septum was achieved. The septum was then quilted with 4-0 plain gut sutures. The hemitransfixion incision was closed with interrupted 4-0 chromic sutures.   Attention was then focused on the left maxillary and ethmoid sinuses. Using a 0 endoscope, the left nasal cavity was examined. The left middle turbinate was carefully medialized. The uncinate process was resected with a freer elevator. Polypoid tissue was noted to obstruct the middle meatus. The polypoid tissue was removed using a combination of Blakesley forceps, Tru-Cut forceps, and microdebrider. The opening to the maxillary sinus was entered and enlarged. Polypoid tissue was removed from the maxillary sinus. The maxillary sinus was copiously irrigated. The anterior and posterior ethmoid cavities were then entered. The bony partitions were taken down using Tru-Cut forceps and  microdebrider. The ethmoid sinuses were also copiously irrigated. The same procedure was repeated on the right side without exception.  Attention was then focused on the inferior turbinates. The inferior one half of both hypertrophied inferior turbinate was crossclamped with a Kelly clamp. The inferior one half of each inferior turbinate was then resected with a pair of cross cutting scissors. Hemostasis was achieved with a suction cautery device.   The care of the patient was turned over to the anesthesiologist. The patient was awakened from anesthesia without difficulty. The patient was extubated and transferred to the recovery room in good condition.   OPERATIVE FINDINGS: Severe nasal septal deviation and bilateral inferior turbinate hypertrophy. Bilateral chronic maxillary and ethmoid sinusitis, with polyposis.  SPECIMEN:Bilateral sinonasal contents.   FOLLOWUP CARE: The patient be discharged home once she is awake and alert. The patient will be placed on Percocet 1 tablets p.o. q.4 hours p.r.n. pain, and amoxicillin 875 mg p.o. b.i.d. for 5 days. The patient will follow up in my office in approximately 1 week for splint removal.   [name] Philomena Doheny, MD

## 2016-12-28 NOTE — Transfer of Care (Signed)
Immediate Anesthesia Transfer of Care Note  Patient: Arby BarretteWanda W [facility]  Procedure(s) Performed: Procedure(s): NASAL SEPTOPLASTY WITH  BILATARAL TURBINATE REDUCTION (Bilateral) ENDOSCOPIC TOTAL ETHMOIDECTOMY,ENDOSCOPIC MAXILLARY ANTROSTOMY  (Bilateral)  Patient Location: PACU  Anesthesia Type:General  Level of Consciousness: awake, alert  and oriented  Airway & Oxygen Therapy: Patient Spontanous Breathing and Patient connected to face mask oxygen  Post-op Assessment: Report given to RN and Post -op Vital signs reviewed and stable  Post vital signs: Reviewed and stable  Last Vitals:  Vitals:   12/28/16 0614 12/28/16 1005  BP: (!) 98/57   Pulse: (!) 50   Resp: (!) 22   Temp: 36.7 C (P) 36.1 C    Last Pain:  Vitals:   12/28/16 0614  TempSrc: Oral  PainSc:       Patients Stated Pain Goal: 6 (12/28/16 0557)  Complications: No apparent anesthesia complications

## 2016-12-28 NOTE — H&P (Signed)
Cc: Chronic rhinosinusitis, chronic nasal obstruction  HPI: The patient is a 66 year old female who returns today for her follow-up evaluation.  The patient was last seen 3 weeks ago.  At that time, she was noted to have chronic rhinosinusitis, nasal septal deviation, and bilateral inferior turbinate hypertrophy.  The patient was experiencing recurrent headaches.  She was treated with Augmentin and Flonase nasal spray.  The patient could not tolerate the use of systemic steroid due to her diabetes.  According to the patient, she continues to have frequent headache and facial pain.  She also complains of chronic nasal obstruction, worse on the right side.  She recently underwent a paranasal sinus CT scan.  The CT showed severe nasal septal deviation, bilateral inferior turbinate hypertrophy, and opacification of her bilateral maxillary and ethmoid sinuses.  Her ostiomeatal complexes were occluded with mucosal edema.    Exam: The nasal cavities were decongested and anesthetised with a combination of oxymetazoline and 4% lidocaine solution. The flexible scope was inserted into the right nasal cavity. Endoscopy of the inferior and middle meatus was performed. Edematous nad erythematous mucosa was noted.  Significant nasal septal deviation to the right contacting the lateral wall. Polypoid mucosa was noted. Olfactory cleft was clear. Nasopharynx was clear. Turbinates were hypertrophied but without mass. The procedure was repeated on the contralateral side with similar findings. The patient tolerated the procedure well. Instructions were given to avoid eating or drinking for 2 hours.   Assessment: 1.  Bilateral chronic rhinosinusitis, involving the maxillary and ethmoid sinuses bilaterally.   2.  Severe nasal septal deviation and bilateral inferior turbinate hypertrophy, causing bilateral nasal obstruction.  More than 95% of her nasal passageways are obstructed.    Plan: 1.  The nasal endoscopy findings and  the CT images are reviewed with the patient and her husband.  2.  Repeat her augmentin antibiotics. The patient should continue with her Flonase nasal spray daily.  3.  She is also encouraged to perform daily nasal saline irrigation. The instructions on how to perform the irrigation are reviewed.   4.  The treatment options also include surgical intervention with septoplasty, turbinate reduction, and bilateral endoscopic surgery (maxillary and ethmoid sinuses).  The risks, benefits, alternatives and details of the surgical procedures are extensively discussed.   5. The patient would like to proceed with the procedures.

## 2016-12-28 NOTE — Anesthesia Procedure Notes (Signed)
Procedure Name: Intubation Date/Time: 12/28/2016 7:46 AM Performed by: Candis Shine Pre-anesthesia Checklist: Patient identified, Emergency Drugs available, Suction available and Patient being monitored Patient Re-evaluated:Patient Re-evaluated prior to inductionOxygen Delivery Method: Circle System Utilized Preoxygenation: Pre-oxygenation with 100% oxygen Intubation Type: IV induction Ventilation: Mask ventilation without difficulty Laryngoscope Size: Mac and 3 Grade View: Grade I Tube type: Oral Tube size: 7.0 mm Number of attempts: 1 Airway Equipment and Method: Stylet Placement Confirmation: ETT inserted through vocal cords under direct vision,  positive ETCO2 and breath sounds checked- equal and bilateral Secured at: 22 cm Tube secured with: Tape Dental Injury: Teeth and Oropharynx as per pre-operative assessment

## 2016-12-28 NOTE — Discharge Instructions (Signed)

## 2016-12-29 ENCOUNTER — Encounter (HOSPITAL_COMMUNITY): Payer: Self-pay | Admitting: Otolaryngology

## 2016-12-29 NOTE — Anesthesia Postprocedure Evaluation (Addendum)
Anesthesia Post Note  [facility]  Procedure(s) Performed: Procedure(s) (LRB): NASAL SEPTOPLASTY WITH  BILATARAL TURBINATE REDUCTION (Bilateral) ENDOSCOPIC TOTAL ETHMOIDECTOMY,ENDOSCOPIC MAXILLARY ANTROSTOMY  (Bilateral)  Patient location during evaluation: PACU Anesthesia Type: General Level of consciousness: awake and alert Pain management: pain level controlled Vital Signs Assessment: post-procedure vital signs reviewed and stable Respiratory status: spontaneous breathing, nonlabored ventilation, respiratory function stable and patient connected to nasal cannula oxygen Cardiovascular status: blood pressure returned to baseline and stable Postop Assessment: no signs of nausea or vomiting Anesthetic complications: no        Last Vitals:  Vitals:   12/28/16 1205 12/28/16 1220  BP:  (!) 129/44  Pulse:  81  Resp:  12  Temp: 36.7 C     Last Pain:  Vitals:   12/28/16 1220  TempSrc:   PainSc: 4    Pain Goal: Patients Stated Pain Goal: 4 (12/28/16 1125)               Shelton SilvasKevin D Miroslava Santellan

## 2017-01-12 ENCOUNTER — Encounter: Payer: Self-pay | Admitting: Gastroenterology

## 2017-01-12 ENCOUNTER — Ambulatory Visit (INDEPENDENT_AMBULATORY_CARE_PROVIDER_SITE_OTHER): Payer: Commercial Managed Care - HMO | Admitting: Gastroenterology

## 2017-01-12 VITALS — BP 88/60 | HR 64 | Ht 61.5 in | Wt 111.2 lb

## 2017-01-12 DIAGNOSIS — K641 Second degree hemorrhoids: Secondary | ICD-10-CM

## 2017-01-12 NOTE — Patient Instructions (Signed)

## 2017-01-12 NOTE — Progress Notes (Signed)
PROCEDURE NOTE: The patient presents with symptomatic grade II  hemorrhoids, requesting rubber band ligation of his/her hemorrhoidal disease.  All risks, benefits and alternative forms of therapy were described and informed consent was obtained.   The anorectum was pre-medicated with 0.125% Nitroglycerine and Recticare The decision was made to band the left lateral internal hemorrhoid, and the [facility] O'Regan System was used to perform band ligation without complication.  Digital anorectal examination was then performed to assure proper positioning of the band, and to adjust the banded tissue as required.  The patient was discharged home without pain or other issues.  Dietary and behavioral recommendations were given and along with follow-up instructions.      The patient will return for  follow-up and possible additional banding as required. No complications were encountered and the patient tolerated the procedure well.  Iona Beard , MD [phone] Mon-Fri 8a-5p [phone] after 5p, weekends, holidays

## 2017-03-03 NOTE — Addendum Note (Signed)
Addendum  created 03/03/17 1129 by Chet Greenley D, MD   Sign clinical note    

## 2017-03-29 ENCOUNTER — Other Ambulatory Visit: Payer: Self-pay | Admitting: Internal Medicine

## 2017-03-29 DIAGNOSIS — Z1231 Encounter for screening mammogram for malignant neoplasm of breast: Secondary | ICD-10-CM

## 2017-04-07 ENCOUNTER — Ambulatory Visit
Admission: RE | Admit: 2017-04-07 | Discharge: 2017-04-07 | Disposition: A | Discharge status: Home / Self Care | Payer: Commercial Managed Care - HMO | Source: Ambulatory Visit | Attending: Internal Medicine | Admitting: Internal Medicine

## 2017-04-07 DIAGNOSIS — Z1231 Encounter for screening mammogram for malignant neoplasm of breast: Secondary | ICD-10-CM

## 2017-06-06 DIAGNOSIS — E162 Hypoglycemia, unspecified: Secondary | ICD-10-CM | POA: Diagnosis not present

## 2017-06-06 DIAGNOSIS — E161 Other hypoglycemia: Secondary | ICD-10-CM | POA: Diagnosis not present

## 2017-06-15 DIAGNOSIS — Z23 Encounter for immunization: Secondary | ICD-10-CM | POA: Diagnosis not present

## 2017-06-28 DIAGNOSIS — H00014 Hordeolum externum left upper eyelid: Secondary | ICD-10-CM | POA: Diagnosis not present

## 2017-07-05 DIAGNOSIS — H00014 Hordeolum externum left upper eyelid: Secondary | ICD-10-CM | POA: Diagnosis not present

## 2017-07-12 DIAGNOSIS — H40049 Steroid responder, unspecified eye: Secondary | ICD-10-CM | POA: Diagnosis not present

## 2017-07-12 DIAGNOSIS — H00014 Hordeolum externum left upper eyelid: Secondary | ICD-10-CM | POA: Diagnosis not present

## 2017-08-02 DIAGNOSIS — H00014 Hordeolum externum left upper eyelid: Secondary | ICD-10-CM | POA: Diagnosis not present

## 2017-08-02 DIAGNOSIS — H40049 Steroid responder, unspecified eye: Secondary | ICD-10-CM | POA: Diagnosis not present

## 2017-08-04 DIAGNOSIS — E108 Type 1 diabetes mellitus with unspecified complications: Secondary | ICD-10-CM | POA: Diagnosis not present

## 2017-08-22 DIAGNOSIS — M859 Disorder of bone density and structure, unspecified: Secondary | ICD-10-CM | POA: Diagnosis not present

## 2017-08-22 DIAGNOSIS — I10 Essential (primary) hypertension: Secondary | ICD-10-CM | POA: Diagnosis not present

## 2017-08-22 DIAGNOSIS — E103293 Type 1 diabetes mellitus with mild nonproliferative diabetic retinopathy without macular edema, bilateral: Secondary | ICD-10-CM | POA: Diagnosis not present

## 2017-08-30 DIAGNOSIS — E1143 Type 2 diabetes mellitus with diabetic autonomic (poly)neuropathy: Secondary | ICD-10-CM | POA: Diagnosis not present

## 2017-08-30 DIAGNOSIS — Z4681 Encounter for fitting and adjustment of insulin pump: Secondary | ICD-10-CM | POA: Diagnosis not present

## 2017-08-30 DIAGNOSIS — L2089 Other atopic dermatitis: Secondary | ICD-10-CM | POA: Diagnosis not present

## 2017-08-30 DIAGNOSIS — G6289 Other specified polyneuropathies: Secondary | ICD-10-CM | POA: Diagnosis not present

## 2017-08-30 DIAGNOSIS — H02886 Meibomian gland dysfunction of left eye, unspecified eyelid: Secondary | ICD-10-CM | POA: Diagnosis not present

## 2017-08-30 DIAGNOSIS — Z1389 Encounter for screening for other disorder: Secondary | ICD-10-CM | POA: Diagnosis not present

## 2017-08-30 DIAGNOSIS — Z0000 Encounter for general adult medical examination without abnormal findings: Secondary | ICD-10-CM | POA: Diagnosis not present

## 2017-08-30 DIAGNOSIS — Z23 Encounter for immunization: Secondary | ICD-10-CM | POA: Diagnosis not present

## 2017-08-30 DIAGNOSIS — I7300 Raynaud's syndrome without gangrene: Secondary | ICD-10-CM | POA: Diagnosis not present

## 2017-08-30 DIAGNOSIS — M5030 Other cervical disc degeneration, unspecified cervical region: Secondary | ICD-10-CM | POA: Diagnosis not present

## 2017-08-30 DIAGNOSIS — F418 Other specified anxiety disorders: Secondary | ICD-10-CM | POA: Diagnosis not present

## 2017-08-30 DIAGNOSIS — H02883 Meibomian gland dysfunction of right eye, unspecified eyelid: Secondary | ICD-10-CM | POA: Diagnosis not present

## 2017-08-30 DIAGNOSIS — H00014 Hordeolum externum left upper eyelid: Secondary | ICD-10-CM | POA: Diagnosis not present

## 2017-08-30 DIAGNOSIS — E1040 Type 1 diabetes mellitus with diabetic neuropathy, unspecified: Secondary | ICD-10-CM | POA: Diagnosis not present

## 2017-08-30 DIAGNOSIS — Z6820 Body mass index (BMI) 20.0-20.9, adult: Secondary | ICD-10-CM | POA: Diagnosis not present

## 2017-08-30 DIAGNOSIS — E103293 Type 1 diabetes mellitus with mild nonproliferative diabetic retinopathy without macular edema, bilateral: Secondary | ICD-10-CM | POA: Diagnosis not present

## 2017-10-23 DIAGNOSIS — E1040 Type 1 diabetes mellitus with diabetic neuropathy, unspecified: Secondary | ICD-10-CM | POA: Diagnosis not present

## 2017-10-23 DIAGNOSIS — E103293 Type 1 diabetes mellitus with mild nonproliferative diabetic retinopathy without macular edema, bilateral: Secondary | ICD-10-CM | POA: Diagnosis not present

## 2017-11-27 DIAGNOSIS — M5030 Other cervical disc degeneration, unspecified cervical region: Secondary | ICD-10-CM | POA: Diagnosis not present

## 2017-11-27 DIAGNOSIS — Z4681 Encounter for fitting and adjustment of insulin pump: Secondary | ICD-10-CM | POA: Diagnosis not present

## 2017-11-27 DIAGNOSIS — I7300 Raynaud's syndrome without gangrene: Secondary | ICD-10-CM | POA: Diagnosis not present

## 2017-11-27 DIAGNOSIS — I10 Essential (primary) hypertension: Secondary | ICD-10-CM | POA: Diagnosis not present

## 2017-11-27 DIAGNOSIS — G6289 Other specified polyneuropathies: Secondary | ICD-10-CM | POA: Diagnosis not present

## 2017-11-27 DIAGNOSIS — E1040 Type 1 diabetes mellitus with diabetic neuropathy, unspecified: Secondary | ICD-10-CM | POA: Diagnosis not present

## 2017-11-27 DIAGNOSIS — F418 Other specified anxiety disorders: Secondary | ICD-10-CM | POA: Diagnosis not present

## 2017-11-27 DIAGNOSIS — E1143 Type 2 diabetes mellitus with diabetic autonomic (poly)neuropathy: Secondary | ICD-10-CM | POA: Diagnosis not present

## 2017-11-27 DIAGNOSIS — M859 Disorder of bone density and structure, unspecified: Secondary | ICD-10-CM | POA: Diagnosis not present

## 2017-11-27 DIAGNOSIS — E103293 Type 1 diabetes mellitus with mild nonproliferative diabetic retinopathy without macular edema, bilateral: Secondary | ICD-10-CM | POA: Diagnosis not present

## 2017-11-27 DIAGNOSIS — Z6820 Body mass index (BMI) 20.0-20.9, adult: Secondary | ICD-10-CM | POA: Diagnosis not present

## 2017-12-06 DIAGNOSIS — H40049 Steroid responder, unspecified eye: Secondary | ICD-10-CM | POA: Diagnosis not present

## 2017-12-06 DIAGNOSIS — H26492 Other secondary cataract, left eye: Secondary | ICD-10-CM | POA: Diagnosis not present

## 2017-12-06 DIAGNOSIS — E103293 Type 1 diabetes mellitus with mild nonproliferative diabetic retinopathy without macular edema, bilateral: Secondary | ICD-10-CM | POA: Diagnosis not present

## 2017-12-06 DIAGNOSIS — Z961 Presence of intraocular lens: Secondary | ICD-10-CM | POA: Diagnosis not present

## 2017-12-20 DIAGNOSIS — R509 Fever, unspecified: Secondary | ICD-10-CM | POA: Diagnosis not present

## 2017-12-20 DIAGNOSIS — R6883 Chills (without fever): Secondary | ICD-10-CM | POA: Diagnosis not present

## 2017-12-20 DIAGNOSIS — R05 Cough: Secondary | ICD-10-CM | POA: Diagnosis not present

## 2018-01-08 DIAGNOSIS — R300 Dysuria: Secondary | ICD-10-CM | POA: Diagnosis not present

## 2018-01-08 DIAGNOSIS — N390 Urinary tract infection, site not specified: Secondary | ICD-10-CM | POA: Diagnosis not present

## 2018-01-09 DIAGNOSIS — H02839 Dermatochalasis of unspecified eye, unspecified eyelid: Secondary | ICD-10-CM | POA: Diagnosis not present

## 2018-01-09 DIAGNOSIS — H26492 Other secondary cataract, left eye: Secondary | ICD-10-CM | POA: Diagnosis not present

## 2018-01-09 DIAGNOSIS — E119 Type 2 diabetes mellitus without complications: Secondary | ICD-10-CM | POA: Diagnosis not present

## 2018-01-09 DIAGNOSIS — Z961 Presence of intraocular lens: Secondary | ICD-10-CM | POA: Diagnosis not present

## 2018-01-16 DIAGNOSIS — J209 Acute bronchitis, unspecified: Secondary | ICD-10-CM | POA: Diagnosis not present

## 2018-01-16 DIAGNOSIS — R05 Cough: Secondary | ICD-10-CM | POA: Diagnosis not present

## 2018-01-16 DIAGNOSIS — R042 Hemoptysis: Secondary | ICD-10-CM | POA: Diagnosis not present

## 2018-02-01 ENCOUNTER — Other Ambulatory Visit (INDEPENDENT_AMBULATORY_CARE_PROVIDER_SITE_OTHER): Payer: Medicare Other

## 2018-02-01 ENCOUNTER — Ambulatory Visit (INDEPENDENT_AMBULATORY_CARE_PROVIDER_SITE_OTHER): Payer: Medicare Other | Admitting: Internal Medicine

## 2018-02-01 ENCOUNTER — Encounter: Payer: Self-pay | Admitting: Internal Medicine

## 2018-02-01 VITALS — BP 106/60 | HR 62 | Ht 62.0 in | Wt 111.0 lb

## 2018-02-01 DIAGNOSIS — J45991 Cough variant asthma: Secondary | ICD-10-CM | POA: Diagnosis not present

## 2018-02-01 LAB — CBC WITH DIFFERENTIAL/PLATELET
BASOS ABS: 0 10*3/uL (ref 0.0–0.1)
BASOS PCT: 0.1 % (ref 0.0–3.0)
EOS ABS: 0.4 10*3/uL (ref 0.0–0.7)
Eosinophils Relative: 8.6 % — ABNORMAL HIGH (ref 0.0–5.0)
HCT: 36.2 % (ref 36.0–46.0)
Hemoglobin: 12.1 g/dL (ref 12.0–15.0)
Lymphocytes Relative: 40.1 % (ref 12.0–46.0)
Lymphs Abs: 2 10*3/uL (ref 0.7–4.0)
MCHC: 33.4 g/dL (ref 30.0–36.0)
MCV: 84.9 fl (ref 78.0–100.0)
MONO ABS: 0.4 10*3/uL (ref 0.1–1.0)
Monocytes Relative: 8.5 % (ref 3.0–12.0)
Neutro Abs: 2.2 10*3/uL (ref 1.4–7.7)
Neutrophils Relative %: 42.7 % — ABNORMAL LOW (ref 43.0–77.0)
Platelets: 247 10*3/uL (ref 150.0–400.0)
RBC: 4.26 Mil/uL (ref 3.87–5.11)
RDW: 14.3 % (ref 11.5–15.5)
WBC: 5.1 10*3/uL (ref 4.0–10.5)

## 2018-02-01 LAB — NITRIC OXIDE: NITRIC OXIDE: 27

## 2018-02-01 MED ORDER — BUDESONIDE-FORMOTEROL FUMARATE 80-4.5 MCG/ACT IN AERO: 2.0000 | INHALATION_SPRAY | Freq: Two times a day (BID) | RESPIRATORY_TRACT | 11 refills | Status: DC

## 2018-02-01 MED ORDER — BUDESONIDE-FORMOTEROL FUMARATE 80-4.5 MCG/ACT IN AERO: 2.0000 | INHALATION_SPRAY | Freq: Two times a day (BID) | RESPIRATORY_TRACT | 11 refills | Status: AC

## 2018-02-01 NOTE — Progress Notes (Signed)
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 Pulmonary Consult    Referred by Dr. Wylene Simmer.  Pt states she has had hx of bronchitis- last and worse case was in March 2019. She states she had been having hemoptysis and SOB, but this has already resolved.   symbicort helped and stopped it 3 weeks prior to OV  And no relapse Cough was "all different colors" all different times more day than noct rx 3 different rounds of abx and hemoptysis was scant and has resolved with nl cxr reported but not avail at ov   nasal congestions worse in am / no longer purulent sputum, voice still hoarse   No obvious day to day or daytime variability or assoc ongoing  excess/ purulent sputum or mucus plugs or hemoptysis or cp or chest tightness, subjective wheeze or overt sinus or hb symptoms. No unusual exposure hx or h/o childhood pna/ asthma or knowledge of premature birth.  Sleeping ok flat now  without nocturnal  or early am exacerbation  of respiratory  c/o's or need for noct saba. Also denies any obvious fluctuation of symptoms with weather or environmental changes or other aggravating or alleviating factors except as outlined above   Current Allergies, Complete Past Medical History, Past Surgical History, Family History, and Social  History were reviewed in [name].  ROS  The following are not active complaints unless bolded Hoarseness, sore throat, dysphagia, dental problems, itching, sneezing,  nasal congestion or discharge of excess mucus or purulent secretions, ear ache,   fever, chills, sweats, unintended wt loss or wt gain, classically pleuritic or exertional cp,  orthopnea pnd or arm/hand swelling  or leg swelling, presyncope, palpitations, abdominal pain, anorexia, nausea, vomiting, diarrhea  or change in bowel habits or change in bladder habits, change in stools or change in urine, dysuria, hematuria,  rash, arthralgias, visual complaints, headache, numbness, weakness or ataxia or problems with walking or coordination,  change in mood or  memory.        Current Meds  Medication Sig  . ALPRAZolam (XANAX) 0.5 MG tablet Take 0.5 mg by mouth 3 (three) times daily as needed for anxiety.  . B-D ULTRA-FINE 33 LANCETS MISC   . BAYER CONTOUR NEXT TEST test strip   . Biotin 5000 MCG TABS Take 5,000 mcg by mouth daily.   . Calcium Carbonate-Vitamin D (CALTRATE 600+D PO) Take 1 tablet by mouth daily.  . Cholecalciferol (VITAMIN D3) 1000 units CAPS Take 1,000 Units by mouth 2 (two) times daily.   . Chromium Picolinate 800 MCG TABS Take 800 mcg by mouth daily.  Marland Kitchen DIOVAN 80 MG tablet Take 80 mg by mouth daily.   Marland Kitchen  GLUCAGEN HYPOKIT 1 MG SOLR injection USE AS NEEDED FOR SEVERE HYPOGLYCEMIA  . hydrOXYzine (ATARAX/VISTARIL) 25 MG tablet Take 25 mg by mouth 3 (three) times daily as needed.  . hyoscyamine (LEVSIN SL) 0.125 MG SL tablet Place 0.125 mg under the tongue every 4 (four) hours as needed for cramping.   . Insulin Human (INSULIN PUMP) 100 unit/ml SOLN Inject into the skin continuous. Novolog 100 unit/mL Basal Rate:  12 am to 11 am  0.4 11 am to 3 pm   0.45 3 pm to 8 pm    0.55  8 pm to 12 am  0.45  . magnesium oxide (MAG-OX) 400 MG tablet Take 400 mg by mouth at bedtime.  . Multiple  Vitamin (MULTIVITAMIN) tablet Take 1 tablet by mouth daily. Women's 50+  . Probiotic Product (VSL#3) CAPS Take 1 capsule by mouth daily.          Review of Systems     Objective:   Physical Exam Somber amb wf nad  Wt Readings from Last 3 Encounters:  02/01/18 111 lb (50.3 kg)  01/12/17 111 lb 3.2 oz (50.4 kg)  12/21/16 111 lb 1.8 oz (50.4 kg)     Vital signs reviewed - Note on arrival 02 sats  97% on RA     HEENT: nl dentition,   and oropharynx. Nl external ear canals without cough reflex - moderate bilateral non-specific turbinate edema  s purulent secretions    NECK :  without JVD/Nodes/TM/ nl carotid upstrokes bilaterally   LUNGS: no acc muscle use,  Nl contour chest which is clear to A and P bilaterally without cough on insp or exp maneuvers   CV:  RRR  no s3 or murmur or increase in P2, and no edema   ABD:  soft and nontender with nl inspiratory excursion in the supine position. No bruits or organomegaly appreciated, bowel sounds nl  MS:  Nl gait/ ext warm without deformities, calf tenderness, cyanosis or clubbing No obvious joint restrictions   SKIN: warm and dry without lesions    NEURO:  alert, approp, nl sensorium with  no motor or cerebellar deficits apparent.     Labs ordered 02/01/2018   Allergy profile     Assessment:

## 2018-02-01 NOTE — Patient Instructions (Addendum)
If having cough/ wheeze/ chest tightness try>>>  symbicort 80 up to Take 2 puffs first thing in am and then another 2 puffs about 12 hours later.     Work on inhaler technique:  relax and gently blow all the way out then take a nice smooth deep breath back in, triggering the inhaler at same time you start breathing in(trigger right before breath if using spacer) .  Hold for up to 5 seconds if you can. Blow out thru nose. Rinse and gargle with water when done  Let Dr Suszanne Conners know if nasal/ sinus symptoms persist    Please remember to go to the lab department downstairs in the basement  for your tests - we will call you with the results when they are available.       If you are satisfied with your treatment plan,  let your doctor know and he/she can either refill your medications or you can return here when your prescription runs out.     If in any way you are not 100% satisfied,  please tell us.  If 100% better, tell your friends!  Pulmonary follow up is as needed

## 2018-02-02 ENCOUNTER — Encounter: Payer: Self-pay | Admitting: Internal Medicine

## 2018-02-02 LAB — RESPIRATORY ALLERGY PROFILE REGION II ~~LOC~~
Allergen, Cedar tree, t12: <0.1 kU/L
Allergen, D pternoyssinus,d7: <0.1 kU/L
Allergen, Mouse Urine Protein, e78: <0.1 kU/L
Allergen, Mulberry, t76: <0.1 kU/L
Allergen, Oak,t7: <0.1 kU/L
CLASS: 0
CLASS: 0
CLASS: 0
CLASS: 0
CLASS: 0
CLASS: 0
CLASS: 0
Cat Dander: <0.1 kU/L
Class: 0
Class: 0
Class: 0
Class: 0
Class: 0
Class: 0
Class: 0
Class: 0
Class: 0
Class: 0
Class: 0
Class: 0
Class: 0
Class: 0
Class: 0
Class: 0
Class: 0
Cockroach: <0.1 kU/L
D. farinae: <0.1 kU/L
IGE (IMMUNOGLOBULIN E), SERUM: 132 kU/L — AB (ref <=114)
Timothy Grass: <0.1 kU/L

## 2018-02-02 LAB — INTERPRETATION:

## 2018-02-02 NOTE — Assessment & Plan Note (Signed)
02/01/2018  After extensive coaching inhaler device  effectiveness =    75%  > try symbicort 80 2bid "prn" - Allergy profile 02/01/2018 >  Eos 0.4 /  IgE   - FENO 02/01/2018  =   27 off all rx x weeks - this  is indeterminate range  She states doing much better now x for lingering nasal congestion which is what started this ball rolling in the first place back in Jan 2019 so it may well be she needs ENT f/u as a next step.   Following issues discussed in detail   1) I discussed chronic sinus dz and the sinobronchial reflex with her to help her understand how her nasal complaints can affect her lung function/ symptoms but for now she's convinced her breathing is mostly better and does not feel she needs symbicort    2) - The proper method of use, as well as anticipated side effects, of a metered-dose inhaler are discussed and demonstrated to the patient. Improved effectiveness after extensive coaching during this visit to a level of approximately 75 % from a baseline of 50 %  s spacer - she might well do better with spacer as she could not trigger and inhale simultaneously and she has spacer but did not bring it with her but she return with it if not happy with it  3) if this is asthma at all it is relatively mild. Based on the study from NEJM  378; 20 p 1865 (2018) in pts with mild asthma it is reasonable to use low dose symbicort eg 80 2bid "prn" flare in this setting but I emphasized this was only shown with symbicort and takes advantage of the rapid onset of action but is not the same as "rescue therapy" but can be stopped once the acute symptoms have resolved  Which is the case here  4) pulmonary f/u is prn    Total time devoted to counseling  > 50 % of initial 60 min office visit:  review case with pt/ discussion of options/alternatives/ personally creating written customized instructions  in presence of pt  then going over those specific  Instructions directly with the pt including how to use  all of the meds but in particular covering each new medication in detail and the difference between the maintenance= "automatic" meds and the prns using an action plan format for the latter (If this problem/symptom => do that organization reading Left to right).  Please see AVS from this visit for a full list of these instructions which I personally wrote for this pt and  are unique to this visit.

## 2018-03-06 DIAGNOSIS — F418 Other specified anxiety disorders: Secondary | ICD-10-CM | POA: Diagnosis not present

## 2018-03-06 DIAGNOSIS — E1143 Type 2 diabetes mellitus with diabetic autonomic (poly)neuropathy: Secondary | ICD-10-CM | POA: Diagnosis not present

## 2018-03-06 DIAGNOSIS — E103293 Type 1 diabetes mellitus with mild nonproliferative diabetic retinopathy without macular edema, bilateral: Secondary | ICD-10-CM | POA: Diagnosis not present

## 2018-03-06 DIAGNOSIS — E1040 Type 1 diabetes mellitus with diabetic neuropathy, unspecified: Secondary | ICD-10-CM | POA: Diagnosis not present

## 2018-03-06 DIAGNOSIS — Z6820 Body mass index (BMI) 20.0-20.9, adult: Secondary | ICD-10-CM | POA: Diagnosis not present

## 2018-03-06 DIAGNOSIS — M859 Disorder of bone density and structure, unspecified: Secondary | ICD-10-CM | POA: Diagnosis not present

## 2018-03-06 DIAGNOSIS — G6289 Other specified polyneuropathies: Secondary | ICD-10-CM | POA: Diagnosis not present

## 2018-05-01 ENCOUNTER — Other Ambulatory Visit: Payer: Self-pay | Admitting: Internal Medicine

## 2018-05-01 DIAGNOSIS — Z1231 Encounter for screening mammogram for malignant neoplasm of breast: Secondary | ICD-10-CM

## 2018-05-25 ENCOUNTER — Ambulatory Visit
Admission: RE | Admit: 2018-05-25 | Discharge: 2018-05-25 | Disposition: A | Discharge status: Home / Self Care | Payer: Medicare Other | Source: Ambulatory Visit | Attending: Internal Medicine | Admitting: Internal Medicine

## 2018-05-25 DIAGNOSIS — Z1231 Encounter for screening mammogram for malignant neoplasm of breast: Secondary | ICD-10-CM | POA: Diagnosis not present

## 2018-06-13 DIAGNOSIS — Z4681 Encounter for fitting and adjustment of insulin pump: Secondary | ICD-10-CM | POA: Diagnosis not present

## 2018-06-13 DIAGNOSIS — F418 Other specified anxiety disorders: Secondary | ICD-10-CM | POA: Diagnosis not present

## 2018-06-13 DIAGNOSIS — I7300 Raynaud's syndrome without gangrene: Secondary | ICD-10-CM | POA: Diagnosis not present

## 2018-06-13 DIAGNOSIS — E103293 Type 1 diabetes mellitus with mild nonproliferative diabetic retinopathy without macular edema, bilateral: Secondary | ICD-10-CM | POA: Diagnosis not present

## 2018-06-13 DIAGNOSIS — Z23 Encounter for immunization: Secondary | ICD-10-CM | POA: Diagnosis not present

## 2018-06-13 DIAGNOSIS — I10 Essential (primary) hypertension: Secondary | ICD-10-CM | POA: Diagnosis not present

## 2018-06-13 DIAGNOSIS — Z6820 Body mass index (BMI) 20.0-20.9, adult: Secondary | ICD-10-CM | POA: Diagnosis not present

## 2018-06-13 DIAGNOSIS — G6289 Other specified polyneuropathies: Secondary | ICD-10-CM | POA: Diagnosis not present

## 2018-06-13 DIAGNOSIS — E1040 Type 1 diabetes mellitus with diabetic neuropathy, unspecified: Secondary | ICD-10-CM | POA: Diagnosis not present

## 2018-06-13 DIAGNOSIS — E1143 Type 2 diabetes mellitus with diabetic autonomic (poly)neuropathy: Secondary | ICD-10-CM | POA: Diagnosis not present

## 2018-06-27 DIAGNOSIS — L02811 Cutaneous abscess of head [any part, except face]: Secondary | ICD-10-CM | POA: Diagnosis not present

## 2018-06-27 DIAGNOSIS — H6001 Abscess of right external ear: Secondary | ICD-10-CM | POA: Diagnosis not present

## 2018-06-27 DIAGNOSIS — L0889 Other specified local infections of the skin and subcutaneous tissue: Secondary | ICD-10-CM | POA: Diagnosis not present

## 2018-06-27 DIAGNOSIS — L218 Other seborrheic dermatitis: Secondary | ICD-10-CM | POA: Diagnosis not present

## 2018-06-28 DIAGNOSIS — Z01419 Encounter for gynecological examination (general) (routine) without abnormal findings: Secondary | ICD-10-CM | POA: Diagnosis not present

## 2018-06-28 DIAGNOSIS — R399 Unspecified symptoms and signs involving the genitourinary system: Secondary | ICD-10-CM | POA: Diagnosis not present

## 2018-06-28 DIAGNOSIS — Z6820 Body mass index (BMI) 20.0-20.9, adult: Secondary | ICD-10-CM | POA: Diagnosis not present

## 2018-06-28 DIAGNOSIS — N952 Postmenopausal atrophic vaginitis: Secondary | ICD-10-CM | POA: Diagnosis not present

## 2018-06-29 DIAGNOSIS — H00025 Hordeolum internum left lower eyelid: Secondary | ICD-10-CM | POA: Diagnosis not present

## 2018-08-28 DIAGNOSIS — R82998 Other abnormal findings in urine: Secondary | ICD-10-CM | POA: Diagnosis not present

## 2018-08-28 DIAGNOSIS — E103293 Type 1 diabetes mellitus with mild nonproliferative diabetic retinopathy without macular edema, bilateral: Secondary | ICD-10-CM | POA: Diagnosis not present

## 2018-08-28 DIAGNOSIS — M859 Disorder of bone density and structure, unspecified: Secondary | ICD-10-CM | POA: Diagnosis not present

## 2018-08-28 DIAGNOSIS — I10 Essential (primary) hypertension: Secondary | ICD-10-CM | POA: Diagnosis not present

## 2018-09-05 DIAGNOSIS — F418 Other specified anxiety disorders: Secondary | ICD-10-CM | POA: Diagnosis not present

## 2018-09-05 DIAGNOSIS — Z1389 Encounter for screening for other disorder: Secondary | ICD-10-CM | POA: Diagnosis not present

## 2018-09-05 DIAGNOSIS — E103293 Type 1 diabetes mellitus with mild nonproliferative diabetic retinopathy without macular edema, bilateral: Secondary | ICD-10-CM | POA: Diagnosis not present

## 2018-09-05 DIAGNOSIS — G6289 Other specified polyneuropathies: Secondary | ICD-10-CM | POA: Diagnosis not present

## 2018-09-05 DIAGNOSIS — Z23 Encounter for immunization: Secondary | ICD-10-CM | POA: Diagnosis not present

## 2018-09-05 DIAGNOSIS — E1040 Type 1 diabetes mellitus with diabetic neuropathy, unspecified: Secondary | ICD-10-CM | POA: Diagnosis not present

## 2018-09-05 DIAGNOSIS — Z4681 Encounter for fitting and adjustment of insulin pump: Secondary | ICD-10-CM | POA: Diagnosis not present

## 2018-09-05 DIAGNOSIS — I10 Essential (primary) hypertension: Secondary | ICD-10-CM | POA: Diagnosis not present

## 2018-09-05 DIAGNOSIS — M5030 Other cervical disc degeneration, unspecified cervical region: Secondary | ICD-10-CM | POA: Diagnosis not present

## 2018-09-05 DIAGNOSIS — I7300 Raynaud's syndrome without gangrene: Secondary | ICD-10-CM | POA: Diagnosis not present

## 2018-09-05 DIAGNOSIS — Z6820 Body mass index (BMI) 20.0-20.9, adult: Secondary | ICD-10-CM | POA: Diagnosis not present

## 2018-09-05 DIAGNOSIS — E1143 Type 2 diabetes mellitus with diabetic autonomic (poly)neuropathy: Secondary | ICD-10-CM | POA: Diagnosis not present

## 2018-09-05 DIAGNOSIS — Z0000 Encounter for general adult medical examination without abnormal findings: Secondary | ICD-10-CM | POA: Diagnosis not present

## 2018-10-24 DIAGNOSIS — K1123 Chronic sialoadenitis: Secondary | ICD-10-CM | POA: Diagnosis not present

## 2018-10-24 DIAGNOSIS — J320 Chronic maxillary sinusitis: Secondary | ICD-10-CM | POA: Diagnosis not present

## 2018-10-24 DIAGNOSIS — J310 Chronic rhinitis: Secondary | ICD-10-CM | POA: Diagnosis not present

## 2018-12-04 DIAGNOSIS — Z6820 Body mass index (BMI) 20.0-20.9, adult: Secondary | ICD-10-CM | POA: Diagnosis not present

## 2018-12-04 DIAGNOSIS — G629 Polyneuropathy, unspecified: Secondary | ICD-10-CM | POA: Diagnosis not present

## 2018-12-04 DIAGNOSIS — E1143 Type 2 diabetes mellitus with diabetic autonomic (poly)neuropathy: Secondary | ICD-10-CM | POA: Diagnosis not present

## 2018-12-04 DIAGNOSIS — Z4681 Encounter for fitting and adjustment of insulin pump: Secondary | ICD-10-CM | POA: Diagnosis not present

## 2018-12-04 DIAGNOSIS — I10 Essential (primary) hypertension: Secondary | ICD-10-CM | POA: Diagnosis not present

## 2018-12-04 DIAGNOSIS — M5030 Other cervical disc degeneration, unspecified cervical region: Secondary | ICD-10-CM | POA: Diagnosis not present

## 2018-12-04 DIAGNOSIS — G6289 Other specified polyneuropathies: Secondary | ICD-10-CM | POA: Diagnosis not present

## 2018-12-04 DIAGNOSIS — M859 Disorder of bone density and structure, unspecified: Secondary | ICD-10-CM | POA: Diagnosis not present

## 2018-12-04 DIAGNOSIS — F418 Other specified anxiety disorders: Secondary | ICD-10-CM | POA: Diagnosis not present

## 2018-12-04 DIAGNOSIS — E1040 Type 1 diabetes mellitus with diabetic neuropathy, unspecified: Secondary | ICD-10-CM | POA: Diagnosis not present

## 2018-12-04 DIAGNOSIS — E103293 Type 1 diabetes mellitus with mild nonproliferative diabetic retinopathy without macular edema, bilateral: Secondary | ICD-10-CM | POA: Diagnosis not present

## 2018-12-04 DIAGNOSIS — I7300 Raynaud's syndrome without gangrene: Secondary | ICD-10-CM | POA: Diagnosis not present

## 2018-12-28 ENCOUNTER — Telehealth: Payer: Self-pay | Admitting: Internal Medicine

## 2018-12-28 NOTE — Telephone Encounter (Signed)
Attempted to call pt but unable to reach. Left message for pt to return call.  When pt returns call back, pt will either need to call PCP for refill of symbicort as in last visit with MW:  Let Dr Suszanne Conners know if nasal/ sinus symptoms persist    Please remember to go to the lab department downstairs in the basement  for your tests - we will call you with the results when they are available.       If you are satisfied with your treatment plan,  let your doctor know and he/she can either refill your medications or you can return here when your prescription runs out.     If in any way you are not 100% satisfied,  please tell us.  If 100% better, tell your friends!  Pulmonary follow up is as needed    If pt decides that she wants [place] to refill her inhaler, she will need to be scheduled a televisit.

## 2018-12-28 NOTE — Telephone Encounter (Signed)
Pt is calling back [phone]

## 2018-12-28 NOTE — Telephone Encounter (Signed)
Called and spoke with pt stating to her that she could call PCP to have her inhaler filled or if she wanted [place] to fill it, per last OV with MW as pt to follow up as needed, she would need to have an OV. Pt expressed understanding and stated she would call PCP. Nothing further needed.

## 2019-03-22 DIAGNOSIS — E1040 Type 1 diabetes mellitus with diabetic neuropathy, unspecified: Secondary | ICD-10-CM | POA: Diagnosis not present

## 2019-03-22 DIAGNOSIS — M5030 Other cervical disc degeneration, unspecified cervical region: Secondary | ICD-10-CM | POA: Diagnosis not present

## 2019-03-22 DIAGNOSIS — F419 Anxiety disorder, unspecified: Secondary | ICD-10-CM | POA: Diagnosis not present

## 2019-03-22 DIAGNOSIS — G629 Polyneuropathy, unspecified: Secondary | ICD-10-CM | POA: Diagnosis not present

## 2019-03-22 DIAGNOSIS — M8580 Other specified disorders of bone density and structure, unspecified site: Secondary | ICD-10-CM | POA: Diagnosis not present

## 2019-03-22 DIAGNOSIS — I10 Essential (primary) hypertension: Secondary | ICD-10-CM | POA: Diagnosis not present

## 2019-03-22 DIAGNOSIS — E1043 Type 1 diabetes mellitus with diabetic autonomic (poly)neuropathy: Secondary | ICD-10-CM | POA: Diagnosis not present

## 2019-03-22 DIAGNOSIS — E103293 Type 1 diabetes mellitus with mild nonproliferative diabetic retinopathy without macular edema, bilateral: Secondary | ICD-10-CM | POA: Diagnosis not present

## 2019-04-02 DIAGNOSIS — J310 Chronic rhinitis: Secondary | ICD-10-CM | POA: Diagnosis not present

## 2019-04-02 DIAGNOSIS — J0101 Acute recurrent maxillary sinusitis: Secondary | ICD-10-CM | POA: Diagnosis not present

## 2019-04-03 ENCOUNTER — Ambulatory Visit
Admission: RE | Admit: 2019-04-03 | Discharge: 2019-04-03 | Disposition: A | Discharge status: Home / Self Care | Payer: Medicare Other | Source: Ambulatory Visit | Attending: Otolaryngology | Admitting: Otolaryngology

## 2019-04-03 ENCOUNTER — Other Ambulatory Visit: Payer: Medicare Other

## 2019-04-03 ENCOUNTER — Other Ambulatory Visit: Payer: Self-pay | Admitting: Otolaryngology

## 2019-04-03 DIAGNOSIS — J329 Chronic sinusitis, unspecified: Secondary | ICD-10-CM

## 2019-04-03 DIAGNOSIS — J3489 Other specified disorders of nose and nasal sinuses: Secondary | ICD-10-CM | POA: Diagnosis not present

## 2019-04-10 DIAGNOSIS — R51 Headache: Secondary | ICD-10-CM | POA: Diagnosis not present

## 2019-04-10 DIAGNOSIS — M542 Cervicalgia: Secondary | ICD-10-CM | POA: Diagnosis not present

## 2019-04-10 DIAGNOSIS — I10 Essential (primary) hypertension: Secondary | ICD-10-CM | POA: Diagnosis not present

## 2019-04-10 DIAGNOSIS — E1040 Type 1 diabetes mellitus with diabetic neuropathy, unspecified: Secondary | ICD-10-CM | POA: Diagnosis not present

## 2019-04-10 DIAGNOSIS — F419 Anxiety disorder, unspecified: Secondary | ICD-10-CM | POA: Diagnosis not present

## 2019-04-16 ENCOUNTER — Encounter: Payer: Self-pay | Admitting: Neurology

## 2019-04-16 ENCOUNTER — Encounter

## 2019-04-16 ENCOUNTER — Ambulatory Visit (INDEPENDENT_AMBULATORY_CARE_PROVIDER_SITE_OTHER): Payer: Medicare Other | Admitting: Neurology

## 2019-04-16 ENCOUNTER — Telehealth: Payer: Self-pay | Admitting: Neurology

## 2019-04-16 ENCOUNTER — Other Ambulatory Visit: Payer: Self-pay

## 2019-04-16 VITALS — BP 112/73 | HR 65 | Temp 97.8°F | Ht 62.0 in | Wt 110.0 lb

## 2019-04-16 DIAGNOSIS — I7300 Raynaud's syndrome without gangrene: Secondary | ICD-10-CM | POA: Diagnosis not present

## 2019-04-16 DIAGNOSIS — G901 Familial dysautonomia [Riley-Day]: Secondary | ICD-10-CM

## 2019-04-16 DIAGNOSIS — H814 Vertigo of central origin: Secondary | ICD-10-CM | POA: Diagnosis not present

## 2019-04-16 DIAGNOSIS — H5712 Ocular pain, left eye: Secondary | ICD-10-CM

## 2019-04-16 MED ORDER — ALPRAZOLAM 0.5 MG PO TABS: 0.5000 mg | ORAL_TABLET | Freq: Two times a day (BID) | ORAL | 0 refills | Status: AC | PRN

## 2019-04-16 MED ORDER — OXYCODONE-ACETAMINOPHEN 10-325 MG PO TABS: ORAL_TABLET | ORAL | 0 refills | Status: DC

## 2019-04-16 NOTE — Patient Instructions (Signed)
Dizziness Dizziness is a common problem. It is a feeling of unsteadiness or light-headedness. You may feel like you are about to faint. Dizziness can lead to injury if you stumble or fall. Anyone can become dizzy, but dizziness is more common in older adults. This condition can be caused by a number of things, including medicines, dehydration, or illness. Follow these instructions at home: Eating and drinking  Drink enough fluid to keep your urine clear or pale yellow. This helps to keep you from becoming dehydrated. Try to drink more clear fluids, such as water.  Do not drink alcohol.  Limit your caffeine intake if told to do so by your health care provider. Check ingredients and nutrition facts to see if a food or beverage contains caffeine.  Limit your salt (sodium) intake if told to do so by your health care provider. Check ingredients and nutrition facts to see if a food or beverage contains sodium. Activity  Avoid making quick movements. ? Rise slowly from chairs and steady yourself until you feel okay. ? In the morning, first sit up on the side of the bed. When you feel okay, stand slowly while you hold onto something until you know that your balance is fine.  If you need to stand in one place for a long time, move your legs often. Tighten and relax the muscles in your legs while you are standing.  Do not drive or use heavy machinery if you feel dizzy.  Avoid bending down if you feel dizzy. Place items in your home so that they are easy for you to reach without leaning over. Lifestyle  Do not use any products that contain nicotine or tobacco, such as cigarettes and e-cigarettes. If you need help quitting, ask your health care provider.  Try to reduce your stress level by using methods such as yoga or meditation. Talk with your health care provider if you need help to manage your stress. General instructions  Watch your dizziness for any changes.  Take over-the-counter and  prescription medicines only as told by your health care provider. Talk with your health care provider if you think that your dizziness is caused by a medicine that you are taking.  Tell a friend or a family member that you are feeling dizzy. If he or she notices any changes in your behavior, have this person call your health care provider.  Keep all follow-up visits as told by your health care provider. This is important. Contact a health care provider if:  Your dizziness does not go away.  Your dizziness or light-headedness gets worse.  You feel nauseous.  You have reduced hearing.  You have new symptoms.  You are unsteady on your feet or you feel like the room is spinning. Get help right away if:  You vomit or have diarrhea and are unable to eat or drink anything.  You have problems talking, walking, swallowing, or using your arms, hands, or legs.  You feel generally weak.  You are not thinking clearly or you have trouble forming sentences. It may take a friend or family member to notice this.  You have chest pain, abdominal pain, shortness of breath, or sweating.  Your vision changes.  You have any bleeding.  You have a severe headache.  You have neck pain or a stiff neck.  You have a fever. These symptoms may represent a serious problem that is an emergency. Do not wait to see if the symptoms will go away. Get medical help   right away. Call your local emergency services (911 in the [place] Do not drive yourself to the hospital. Summary  Dizziness is a feeling of unsteadiness or light-headedness. This condition can be caused by a number of things, including medicines, dehydration, or illness.  Anyone can become dizzy, but dizziness is more common in older adults.  Drink enough fluid to keep your urine clear or pale yellow. Do not drink alcohol.  Avoid making quick movements if you feel dizzy. Monitor your dizziness for any changes. This information is not intended to  replace advice given to you by your health care provider. Make sure you discuss any questions you have with your health care provider. Document Released: 03/15/2001 Document Revised: 09/22/2017 Document Reviewed: 10/22/2016 Elsevier Patient Education  2020 [employer]  

## 2019-04-16 NOTE — Telephone Encounter (Signed)
Schedule for 04/18/19 at [facility].

## 2019-04-16 NOTE — Progress Notes (Signed)
Provider:  Melvyn Novasarmen  Ojani Berenson, M D  Referring Provider: Gaspar Garbeisovec, Richard W, MD Primary Care Physician:  Gaspar Garbeisovec, Richard W, MD  Chief Complaint  Patient presents with   New Patient (Initial Visit)    pt alone, rm 11. pt presents today and has been off and on struggling with headaches. complains of headache today. she states that about 3 mths ago she started out what she thought was allergies, started zyrtec daily. pressure became worse 6/13. she thought it was sinus infection. 10 day agumentin tx.    Other    after completing ATB but  didnt get better, went to see ENT, she took again antibiotic and steroid and continued to worsen. feels like weight on top of head.  She complains of dizziness, nausea associated with the headaches. hx of migraine when she was younger., first diagnosed with diabetes. She states this feeling is different from migraines she has had in the past. this headache has been constant for last 3 mths and it has just increased in pressure over time.    Other    CT of face wsa completed 04/03/2019    HPI:  Taylor Adams is a 68 y.o. female  Is seen here as a referral from Dr. Wylene Simmerisovec / cc: Dr Suszanne Connerseoh for a relentless headache.   pt alone, rm 11. pt presents today and has been off and on struggling with headaches. complains of headache today. she states that about 3 mths ago she started out what she thought was allergies, started zyrtec daily. pressure became worse 6/13. she thought it was sinus infection. 10 day agumentin tx.     after completing ATB but  didnt get better, went to see ENT, she took again antibiotic and steroid and continued to worsen. feels like weight on top of head.  She complains of dizziness, nausea associated with the headaches. hx of migraine when she was younger., first diagnosed with diabetes. She states this feeling is different from migraines she has had in the past. this headache has been constant for last 3 mths and it has just increased in  pressure over time.     CT of face wsa completed 04/03/2019  IMPRESSION: 1. Bilateral maxillary antrostomy and ethmoidectomy. 2. Mild mucosal thickening of right frontal sinus antrum, ethmoidectomy cavities, and right maxillary sinus. Moderate mucosal thickening of left maxillary sinus. No sinus fluid levels. 3. Patent sinus drainage pathways. Electronically Signed   By: Mitzi HansenLance  Furusawa-Stratton M.D.   On: 04/04/2019 00:55  Mrs. Slay u is a 68 year old fright handed caucasian female patient with a diagnosis of DM at age 68 , after she had 2 children ( not gestational , following a febrile strep throat 1980)  She  has had sinus problems for about 10 years now,  Had a parotid gland stone. She was diagnosed with Sjgren syndrome. In 2015 she suffered a bowl obstruction.  In 2018, underwent surgery in 2018 with Dr Suszanne Connerseoh, and improved drastically - now, it was thought to have similar causes this time.  She was given  ATB as described above. Initially felt she had fluid swapping in the sinus cavities, but after ATB this went away- unfortunatly without resolving the unique headaches.  This is not perceived as migrainous.  She reports  dizziness, feels nauseated and under pressure of the left frontal head, behind the left eye. . .  She is diabetic and reports that the steroids did worsen her blood glucose reading. History as above.  Review of Systems: Out of a complete 14 system review, the patient complains of only the following symptoms, and all other reviewed systems are negative. Ear itching, granulated parotid glands- not TMJ., dry mouth, retro orbital pain. . Frontal fulness, nausea and headaches.     Social History   Socioeconomic History   Marital status: Married    Spouse name: Not on file   Number of children: Not on file   Years of education: Not on file   Highest education level: Not on file  Occupational History   Not on file  Social Needs   Financial resource  strain: Not on file   Food insecurity    Worry: Not on file    Inability: Not on file   Transportation needs    Medical: Not on file    Non-medical: Not on file  Tobacco Use   Smoking status: Never Smoker   Smokeless tobacco: Never Used  Substance and Sexual Activity   Alcohol use: Not Currently    Alcohol/week: 0.0 standard drinks   Drug use: No   Sexual activity: Not on file  Lifestyle   Physical activity    Days per week: Not on file    Minutes per session: Not on file   Stress: Not on file  Relationships   Social connections    Talks on phone: Not on file    Gets together: Not on file    Attends religious service: Not on file    Active member of club or organization: Not on file    Attends meetings of clubs or organizations: Not on file    Relationship status: Not on file   Intimate partner violence    Fear of current or ex partner: Not on file    Emotionally abused: Not on file    Physically abused: Not on file    Forced sexual activity: Not on file  Other Topics Concern   Not on file  Social History Narrative   Not on file    Family History  Problem Relation Age of Onset   Colon polyps Father    Heart disease Father    Diabetes Father    Diabetes Mother    Heart disease Mother    Heart disease Brother    Allergies Brother    Allergies Sister    Colon cancer Neg Hx    Rectal cancer Neg Hx    Stomach cancer Neg Hx    Esophageal cancer Neg Hx     Past Medical History:  Diagnosis Date   Anxiety    Arthritis    Benign colon polyp    Cataracts, bilateral    Depression    Diabetes mellitus    Diarrhea    Gastroparesis    Headache    Intestinal bacterial overgrowth    PONV (postoperative nausea and vomiting)    history of n/v   SBO (small bowel obstruction) (HCC)     Past Surgical History:  Procedure Laterality Date   ABDOMINAL HYSTERECTOMY     APPENDECTOMY     BREAST EXCISIONAL BIOPSY Bilateral     benign   BREAST SURGERY     biopsy   CATARACT EXTRACTION, BILATERAL     ILEOCECETOMY  10/07/2013   Procedure: ILEOCECETOMY;  Surgeon: Axel FillerArmando Ramirez, MD;  Location: [facility] OR;  Service: General;;   LAPAROTOMY N/A 10/07/2013   Procedure: EXPLORATORY LAPAROTOMY;  Surgeon: Axel FillerArmando Ramirez, MD;  Location: [facility];  Service: General;  Laterality: N/A;  LYSIS OF ADHESION  10/07/2013   Procedure: LYSIS OF ADHESION;  Surgeon: Ralene Ok, MD;  Location: [facility];  Service: General;;   NASAL SEPTOPLASTY W/ TURBINOPLASTY Bilateral 12/28/2016   Procedure: NASAL SEPTOPLASTY WITH  BILATARAL TURBINATE REDUCTION;  Surgeon: Leta Baptist, MD;  Location: [facility];  Service: ENT;  Laterality: Bilateral;   resection of lymph node in neck     ROTATOR CUFF REPAIR     right side   SINUS ENDO WITH FUSION Bilateral 12/28/2016   Procedure: ENDOSCOPIC TOTAL ETHMOIDECTOMY,ENDOSCOPIC MAXILLARY ANTROSTOMY ;  Surgeon: Leta Baptist, MD;  Location: [facility] OR;  Service: ENT;  Laterality: Bilateral;    Current Outpatient Medications  Medication Sig Dispense Refill   ALPRAZolam (XANAX) 0.5 MG tablet Take 0.5 mg by mouth 3 (three) times daily as needed for anxiety.     B-D ULTRA-FINE 33 LANCETS MISC      BAYER CONTOUR NEXT TEST test strip      Biotin 5000 MCG TABS Take 5,000 mcg by mouth daily.      budesonide-formoterol (SYMBICORT) 80-4.5 MCG/ACT inhaler Inhale 2 puffs into the lungs 2 (two) times daily. 1 Inhaler 11   Calcium Carbonate-Vitamin D (CALTRATE 600+D PO) Take 1 tablet by mouth daily.     Cholecalciferol (VITAMIN D3) 1000 units CAPS Take 1,000 Units by mouth 2 (two) times daily.      Chromium Picolinate 800 MCG TABS Take 800 mcg by mouth daily.     DIOVAN 80 MG tablet Take 80 mg by mouth daily.      GLUCAGEN HYPOKIT 1 MG SOLR injection USE AS NEEDED FOR SEVERE HYPOGLYCEMIA  6   hydrOXYzine (ATARAX/VISTARIL) 25 MG tablet Take 25 mg by mouth 3 (three) times daily as needed.     hyoscyamine (LEVSIN SL) 0.125 MG SL  tablet Place 0.125 mg under the tongue every 4 (four) hours as needed for cramping.      Insulin Human (INSULIN PUMP) 100 unit/ml SOLN Inject into the skin continuous. Novolog 100 unit/mL Basal Rate:  12 am to 11 am  0.4 11 am to 3 pm   0.45 3 pm to 8 pm    0.55  8 pm to 12 am  0.45     losartan (COZAAR) 25 MG tablet Take 50 mg by mouth daily.     magnesium oxide (MAG-OX) 400 MG tablet Take 400 mg by mouth at bedtime.     Multiple Vitamin (MULTIVITAMIN) tablet Take 1 tablet by mouth daily. Women's 50+     Probiotic Product (VSL#3) CAPS Take 1 capsule by mouth daily. 30 capsule 11   No current facility-administered medications for this visit.     Allergies as of 04/16/2019 - Review Complete 04/16/2019  Allergen Reaction Noted   Codeine Itching 04/15/2016   Latex Rash 04/15/2016    Vitals: BP 112/73    Pulse 65    Temp 97.8 F (36.6 C)    Ht 5\' 2"  (1.575 m)    Wt 110 lb (49.9 kg)    BMI 20.12 kg/m  Last Weight:  Wt Readings from Last 1 Encounters:  04/16/19 110 lb (49.9 kg)   Last Height:   Ht Readings from Last 1 Encounters:  04/16/19 5\' 2"  (1.575 m)    Physical exam:  General: The patient is awake, alert and appears demure, quiet , in acute distress. The patient is well groomed. She appears pale.  Head: Normocephalic, atraumatic.  Neck is supple. Mallampati 5 - high f grade- but not inflamed. ,  neck circumference: 13 "  Cardiovascular:  Regular rate and rhythm, without  murmurs or carotid bruit, and without distended neck veins. Respiratory: Lungs are clear to auscultation. Skin:  With reynaud's in both feet.  Trunk: BMI is 22. 3/h   Neurologic exam : The patient is awake and alert, oriented to place and time. Memory subjective intact. There is a normal attention span & concentration ability. Speech is fluent without dysarthria, dysphonia or aphasia. Mood and affect are appropriate.  Cranial nerves: Pupils are equal and briskly reactive to light.  Funduscopic  exam deferred.  Extraocular movements  in vertical and horizontal planestested  and without nystagmus.  There is an abnormality of eye movements in the left eye with accomodation. I noted the pupil to turn in and down much more than on the right.  She reports pain with eye movements.  Visual fields by finger perimetry are intact. Hearing to finger rub intact.   Facial sensation intact to fine touch. Facial motor strength is symmetric and tongue  moves midline. Tongue protrusion into either cheek is normal. Shoulder shrug is normal.   Motor exam:   Normal tone ,muscle bulk and symmetric strength in all extremities.  Sensory:  Fine touch, pinprick and vibration were tested in upper  extremities. Proprioception was normal. Neuropathy in both feet, sensory d reduce and painful.  Coordination: Rapid alternating movements in the fingers/hands were normal. Finger-to-nose maneuver  normal without evidence of ataxia, dysmetria or tremor.  Gait and station: Patient walks without assistive device and  reports being dizzy. She s able unassisted to stand up.  Stance is stable and normal. She turned with 4 steps and reported a sensation of VERTIGO, a clockwise rotation .  Deep tendon reflexes: in the upper and lower extremities are symmetric and intact. Babinski maneuver response is equivocal.    Assessment:  After physical and neurologic examination, review of laboratory studies, imaging, neurophysiology testing and pre-existing records, assessment is that of :    abnormal eye movements left eye, retro-orbital eye pain. Vertigo, clockwise rotation.   Plan:  Treatment plan and additional workup :  MRI Brain - I like to forgo contrast. Rule out stroke. Sjoegren's, eczema, raynauds and and DM Type 1  for a long time.   Vestibular rehab if negative   Low dose xanax for now, to decrease Vertigo. Porfirio Mylar.     Elody Kleinsasser MD 04/16/2019

## 2019-04-16 NOTE — Telephone Encounter (Signed)
Open MRI  [place]ic[place] of [place] order faxed to [facility] they will reach out to the patient to schedule.

## 2019-04-18 DIAGNOSIS — H814 Vertigo of central origin: Secondary | ICD-10-CM | POA: Diagnosis not present

## 2019-04-24 ENCOUNTER — Telehealth: Payer: Self-pay | Admitting: Neurology

## 2019-04-24 NOTE — Telephone Encounter (Signed)
[facility] MRI Brain -     Female- 68 year old. Patient with dysautonomia. Central vertigo. DM type 1, HTN, neuropathy.    I have reviewed the images and noted no evidence of acute stroke, tumor or demyelination. Normal brainstem configuration. Normal brain volume for age.   Larey Seat, MD

## 2019-04-24 NOTE — Telephone Encounter (Signed)
Pt is asking for a call with the results to her MRI which was done on Thursday of last week

## 2019-04-24 NOTE — Telephone Encounter (Signed)
We received the MRI disk just yesterday, I have given to Dr Brett Fairy and will call the patient with the results once she has reviewed.

## 2019-04-25 ENCOUNTER — Encounter: Payer: Self-pay | Admitting: Neurology

## 2019-04-26 DIAGNOSIS — H43393 Other vitreous opacities, bilateral: Secondary | ICD-10-CM | POA: Diagnosis not present

## 2019-04-26 DIAGNOSIS — Z961 Presence of intraocular lens: Secondary | ICD-10-CM | POA: Diagnosis not present

## 2019-04-26 DIAGNOSIS — H35372 Puckering of macula, left eye: Secondary | ICD-10-CM | POA: Diagnosis not present

## 2019-04-26 DIAGNOSIS — G44201 Tension-type headache, unspecified, intractable: Secondary | ICD-10-CM | POA: Diagnosis not present

## 2019-04-26 DIAGNOSIS — H5319 Other subjective visual disturbances: Secondary | ICD-10-CM | POA: Diagnosis not present

## 2019-04-26 DIAGNOSIS — E119 Type 2 diabetes mellitus without complications: Secondary | ICD-10-CM | POA: Diagnosis not present

## 2019-04-26 DIAGNOSIS — H5315 Visual distortions of shape and size: Secondary | ICD-10-CM | POA: Diagnosis not present

## 2019-04-26 DIAGNOSIS — H5213 Myopia, bilateral: Secondary | ICD-10-CM | POA: Diagnosis not present

## 2019-04-30 ENCOUNTER — Other Ambulatory Visit: Payer: Self-pay | Admitting: Neurology

## 2019-04-30 NOTE — Telephone Encounter (Signed)
I called pt, advised her that the oxycodone RX will not be refilled by Dr. Brett Fairy. Pt verbalized understanding.

## 2019-04-30 NOTE — Telephone Encounter (Signed)
Pt is requesting a refill of oxyCODONE-acetaminophen (PERCOCET) 10-325 MG tablet , to be sent to [id number] - [place], [place]

## 2019-04-30 NOTE — Telephone Encounter (Signed)
No refill- this was not an intended long term therapy

## 2019-05-07 ENCOUNTER — Ambulatory Visit (INDEPENDENT_AMBULATORY_CARE_PROVIDER_SITE_OTHER): Payer: Medicare Other | Admitting: Neurology

## 2019-05-07 ENCOUNTER — Encounter: Payer: Self-pay | Admitting: Neurology

## 2019-05-07 ENCOUNTER — Other Ambulatory Visit: Payer: Self-pay

## 2019-05-07 VITALS — BP 132/70 | HR 64 | Temp 97.8°F | Ht 62.0 in | Wt 111.0 lb

## 2019-05-07 DIAGNOSIS — M5481 Occipital neuralgia: Secondary | ICD-10-CM | POA: Diagnosis not present

## 2019-05-07 DIAGNOSIS — M7918 Myalgia, other site: Secondary | ICD-10-CM

## 2019-05-07 DIAGNOSIS — G43711 Chronic migraine without aura, intractable, with status migrainosus: Secondary | ICD-10-CM

## 2019-05-07 NOTE — Progress Notes (Signed)
[facility]    Provider:  Dr Lucia GaskinsAhern Requesting Provider: Melvyn Novasarmen Dohmeier Primary Care Provider:  Gaspar Garbeisovec, Richard W, MD  CC:  Occipital neuralgia  HPI:  Taylor Adams is a 68 y.o. female here as requested by Tisovec, Adelfa Kohichard W, MD for migraines and headaches. She had migraines at the age of 68 and they were severe and they lasted for 2 months and then would get one sporadically had not had one in years. In March/April she started taking Zyrtec for sinus problems, she started having pressure behind the eyes. On June 19th she started an antibiotic. She took augmentin and pressure never became better. She saw ENT and her sinuses were full of mucous but she had a CT scan and he recommended Dr. Vickey Hugerohmeier. She has a headache right now, it is behind the eyes and radiates to the right back of the head. Like her head is in a vice grip. Daily. 24 x7, every day, only one day she did not have a migraine. Migrainous features. Percocet makes it tolerable.  She has a lot of neck pain, the pain radiates from the back of the head all the way to the top and sides of the head, tender to the touch, can be brief and stabbing and shooting but has headache in between episodes, severe, daily, continuous and severely affecting her life.  Review of Systems: Patient complains of symptoms per HPI as well as the following symptoms: no CP, no SOB, no vision changes. Pertinent negatives and positives per HPI. All others negative.   Social History   Socioeconomic History   Marital status: Married    Spouse name: Not on file   Number of children: Not on file   Years of education: Not on file   Highest education level: Not on file  Occupational History   Not on file  Social Needs   Financial resource strain: Not on file   Food insecurity    Worry: Not on file    Inability: Not on file   Transportation needs    Medical: Not on file    Non-medical: Not on file  Tobacco Use   Smoking  status: Never Smoker   Smokeless tobacco: Never Used  Substance and Sexual Activity   Alcohol use: Not Currently    Alcohol/week: 0.0 standard drinks   Drug use: No   Sexual activity: Not on file  Lifestyle   Physical activity    Days per week: Not on file    Minutes per session: Not on file   Stress: Not on file  Relationships   Social connections    Talks on phone: Not on file    Gets together: Not on file    Attends religious service: Not on file    Active member of club or organization: Not on file    Attends meetings of clubs or organizations: Not on file    Relationship status: Not on file   Intimate partner violence    Fear of current or ex partner: Not on file    Emotionally abused: Not on file    Physically abused: Not on file    Forced sexual activity: Not on file  Other Topics Concern   Not on file  Social History Narrative   Lives at home with her husband   Right handed   Caffeine: 2 cups per day    Family History  Problem Relation Age of Onset   Colon polyps Father    Heart disease  Father    Diabetes Father    Diabetes Mother    Heart disease Mother    Heart disease Brother    Allergies Brother    Allergies Sister    Colon cancer Neg Hx    Rectal cancer Neg Hx    Stomach cancer Neg Hx    Esophageal cancer Neg Hx     Past Medical History:  Diagnosis Date   Anxiety    Arthritis    Benign colon polyp    Cataracts, bilateral    Depression    Diabetes mellitus    Diarrhea    Gastroparesis    Headache    Intestinal bacterial overgrowth    PONV (postoperative nausea and vomiting)    history of n/v   SBO (small bowel obstruction) (HCC)     Patient Active Problem List   Diagnosis Date Noted   Chronic migraine without aura, with intractable migraine, so stated, with status migrainosus 05/14/2019   Cough variant asthma vs UACS 02/01/2018   SBO (small bowel obstruction) (HCC) 10/06/2013   DIABETES MELLITUS  05/29/2008   GASTROPARESIS 05/29/2008   COLONIC POLYPS, BENIGN, HX OF 05/29/2008    Past Surgical History:  Procedure Laterality Date   ABDOMINAL HYSTERECTOMY     APPENDECTOMY     BREAST EXCISIONAL BIOPSY Bilateral    benign   BREAST SURGERY     biopsy   CATARACT EXTRACTION Bilateral 2017   Dr. Vonna KotykBevis   CATARACT EXTRACTION, BILATERAL     EYE SURGERY     ILEOCECETOMY  10/07/2013   Procedure: Ferne CoeILEOCECETOMY;  Surgeon: Axel FillerArmando Ramirez, MD;  Location: [facility] OR;  Service: General;;   LAPAROTOMY N/A 10/07/2013   Procedure: EXPLORATORY LAPAROTOMY;  Surgeon: Axel FillerArmando Ramirez, MD;  Location: [facility] OR;  Service: General;  Laterality: N/A;   LYSIS OF ADHESION  10/07/2013   Procedure: LYSIS OF ADHESION;  Surgeon: Axel FillerArmando Ramirez, MD;  Location: [facility] OR;  Service: General;;   NASAL SEPTOPLASTY W/ TURBINOPLASTY Bilateral 12/28/2016   Procedure: NASAL SEPTOPLASTY WITH  BILATARAL TURBINATE REDUCTION;  Surgeon: Newman PiesSu Teoh, MD;  Location: [facility] OR;  Service: ENT;  Laterality: Bilateral;   resection of lymph node in neck     ROTATOR CUFF REPAIR     right side   SINUS ENDO WITH FUSION Bilateral 12/28/2016   Procedure: ENDOSCOPIC TOTAL ETHMOIDECTOMY,ENDOSCOPIC MAXILLARY ANTROSTOMY ;  Surgeon: Newman PiesSu Teoh, MD;  Location: [facility] OR;  Service: ENT;  Laterality: Bilateral;   YAG LASER APPLICATION Bilateral 2018    Current Outpatient Medications  Medication Sig Dispense Refill   ALPRAZolam (XANAX) 0.5 MG tablet Take 1 tablet (0.5 mg total) by mouth 2 (two) times daily as needed for anxiety. 30 tablet 0   B-D ULTRA-FINE 33 LANCETS MISC      BAYER CONTOUR NEXT TEST test strip      Biotin 5000 MCG TABS Take 5,000 mcg by mouth daily.      budesonide-formoterol (SYMBICORT) 80-4.5 MCG/ACT inhaler Inhale 2 puffs into the lungs 2 (two) times daily. 1 Inhaler 11   Calcium Carbonate-Vitamin D (CALTRATE 600+D PO) Take 1 tablet by mouth daily.     Cholecalciferol (VITAMIN D3) 1000 units CAPS Take 1,000 Units by mouth 2  (two) times daily.      Chromium Picolinate 800 MCG TABS Take 800 mcg by mouth daily.     cyclobenzaprine (FLEXERIL) 10 MG tablet Take 10 mg by mouth as needed.     hydrOXYzine (ATARAX/VISTARIL) 25 MG tablet Take 25 mg by mouth 3 (  three) times daily as needed.     hyoscyamine (LEVSIN SL) 0.125 MG SL tablet Place 0.125 mg under the tongue every 4 (four) hours as needed for cramping.      Insulin Human (INSULIN PUMP) 100 unit/ml SOLN Inject into the skin continuous. Novolog 100 unit/mL Basal Rate: 12 am to 4:30 am  0.375 4:30 am to 8 am   0.45 8 am to 12 pm    0.525 12 pm to 3 pm  0.45 3 pm to 9 pm 0.475 9 pm to 12 am 0.425     losartan (COZAAR) 25 MG tablet Take 50 mg by mouth daily.     magnesium oxide (MAG-OX) 400 MG tablet Take 400 mg by mouth at bedtime.     Multiple Vitamin (MULTIVITAMIN) tablet Take 1 tablet by mouth daily. Women's 50+     oxyCODONE-acetaminophen (PERCOCET) 10-325 MG tablet Take prn up to bid for severe pain. 21 tablet 0   Probiotic Product (VSL#3) CAPS Take 1 capsule by mouth daily. 30 capsule 11   TRAMADOL HCL PO Take by mouth as needed.     GLUCAGEN HYPOKIT 1 MG SOLR injection USE AS NEEDED FOR SEVERE HYPOGLYCEMIA  6   No current facility-administered medications for this visit.     Allergies as of 05/07/2019 - Review Complete 05/07/2019  Allergen Reaction Noted   Codeine Itching 04/15/2016   Latex Rash 04/15/2016    Vitals: BP 132/70 (BP Location: Right Arm, Patient Position: Sitting)    Pulse 64    Temp 97.8 F (36.6 C) Comment: taken by front staff   Ht 5\' 2"  (1.575 m)    Wt 111 lb (50.3 kg)    BMI 20.30 kg/m  Last Weight:  Wt Readings from Last 1 Encounters:  05/07/19 111 lb (50.3 kg)   Last Height:   Ht Readings from Last 1 Encounters:  05/07/19 5\' 2"  (1.575 m)     Physical exam: Exam: Gen: NAD, conversant, well nourised, obese, well groomed                     CV: RRR, no MRG. No Carotid Bruits. No peripheral edema, warm,  nontender Eyes: Conjunctivae clear without exudates or hemorrhage  Neuro: Detailed Neurologic Exam  Speech:    Speech is normal; fluent and spontaneous with normal comprehension.  Cognition:    The patient is oriented to person, place, and time;     recent and remote memory intact;     language fluent;     normal attention, concentration,     fund of knowledge Cranial Nerves:    The pupils are equal, round, and reactive to light. The fundi are normal and spontaneous venous pulsations are present. Visual fields are full to finger confrontation. Extraocular movements are intact. Trigeminal sensation is intact and the muscles of mastication are normal. The face is symmetric. The palate elevates in the midline. Hearing intact. Voice is normal. Shoulder shrug is normal. The tongue has normal motion without fasciculations.   Coordination:    Normal finger to nose and heel to shin. Normal rapid alternating movements.   Gait:    Heel-toe and tandem gait are normal.   Motor Observation:    No asymmetry, no atrophy, and no involuntary movements noted. Tone:    Normal muscle tone.    Posture:    Posture is normal. normal erect    Strength:    Strength is V/V in the upper and lower limbs.  Sensation: intact to LT     Reflex Exam:  DTR's:    Deep tendon reflexes in the upper and lower extremities are normal bilaterally.   Toes:    The toes are downgoing bilaterally.   Clonus:    Clonus is absent.    Assessment/Plan: This is a 68 year old patient with chronic migraines and occipital neuralgia.  We had a long discussion today about options, medication, Botox, CGRP injectables or we could try nerve blocks to see if that will help with the occipital nerve pain.  She would like to try nerve blocks.  Her pain significantly improved today after occipital nerve blocks her pain went to 0.  We can try occipital nerve blocks several more times, may consider sending to Dr. Naaman PlummerFred Newton for  radiofrequency ablation or other more permanent pain procedures on the occipital nerves if she receives good benefit but only temporarily, also may have to consider other options as above.  Cervical Myofascial Pain Syndrome; Dry Needling [place]  Migraines and occipital nerve pain: We will try nerve blocks today both occipital and supraorbital.  Orders Placed This Encounter  Procedures   Ambulatory referral to Physical Therapy    All procedures a documented blood were medically necessary, reasonable and appropriate based on the patient's history, medical diagnosis and physician opinion. Verbal informed consent was obtained from the patient, patient was informed of potential risk of procedure, including bruising, bleeding, hematoma formation, infection, muscle weakness, muscle pain, numbness, transient hypertension, transient hyperglycemia and transient insomnia among others. All areas injected were topically clean with isopropyl rubbing alcohol. Nonsterile nonlatex gloves were worn during the procedure.  1. Greater occipital nerve block [phone](64405). The greater occipital nerve site was identified at the nuchal line medial to the occipital artery. Medication was injected into the left and right occipital nerve areas and suboccipital areas. Patient's condition is associated with inflammation of the greater occipital nerve and associated multiple groups. Injection was deemed medically necessary, reasonable and appropriate. Injection represents a separate and unique surgical service.  2. Supraorbital nerve block (64400): Supraorbital nerve site was identified along the incision of the frontal bone on the orbital/supraorbital ridge. Medication was injected into the left and right supraorbital nerve areas. Patient's condition is associated with inflammation of the supraorbital and associated muscle groups. Injection was deemed medically necessary, reasonable and appropriate. Injection represents a separate  and unique surgical service.  5. Facial nerve block [phone](64402): Temporal nerve branch of the facial nerve was identified. Medication was injected into the left and right facial nerve areas. Patient's condition is associated with inflammation of the facial nerve and associated muscle groups. Injection was deemed medically necessary, reasonable and appropriate. Injection represents a separate and unique surgical service.  A total of 40 minutes was spent face-to-face with this patient. Over half this time was spent on counseling patient on the  1. Chronic migraine without aura, with intractable migraine, so stated, with status migrainosus   2. Cervical myofascial pain syndrome   3. Bilateral occipital neuralgia    diagnosis and different diagnostic and therapeutic options, counseling and coordination of care, risks ans benefits of management, compliance, or risk factor reduction and education.  This does not include time spent on nerve blocks.  Cc: Tisovec, Adelfa Kohichard W, MD,    Naomie DeanAntonia Kymia Simi, MD  [facility] [address] [place], [place] [id number]  Phone [phone] Fax [phone]

## 2019-05-07 NOTE — Progress Notes (Signed)
Nerve block w/o steroid: Pt signed consent  0.5% Bupivocaine 4.5 mL LOT: [id number] EXP: 05/2020 NDC: 55150-250-50  2% Lidocaine 4.5 mL LOT: [id number] EXP: 04/02/2020 NDC: 0409-4277-16  

## 2019-05-07 NOTE — Patient Instructions (Signed)
Occipital Nerve Block Patient Information  Description: The occipital nerves originate in the cervical (neck) spinal cord and travel upward through muscle and tissue to supply sensation to the back of the head and top of the scalp.  In addition, the nerves control some of the muscles of the scalp.  Occipital neuralgia is an irritation of these nerves which can cause headaches, numbness of the scalp, and neck discomfort.     The occipital nerve block will interrupt nerve transmission through these nerves and can relieve pain and spasm.  The block consists of insertion of a small needle under the skin in the back of the head to deposit local anesthetic (numbing medicine) and/or steroids around the nerve.  The entire block usually lasts less than 5 minutes.  Conditions which may be treated by occipital blocks:   Muscular pain and spasm of the scalp  Nerve irritation, back of the head  Headaches  Upper neck pain  Preparation for the injection:  1. Do not eat any solid food or dairy products within 8 hours of your appointment. 2. You may drink clear liquids up to 3 hours before appointment.  Clear liquids include water, black coffee, juice or soda.  No milk or cream please. 3. You may take your regular medication, including pain medications, with a sip of water before you appointment.  Diabetics should hold regular insulin (if taken separately) and take 1/2 normal NPH dose the morning of the procedure.  Carry some sugar containing items with you to your appointment. 4. A driver must accompany you and be prepared to drive you home after your procedure. 5. Bring all your current medications with you. 6. An IV may be inserted and sedation may be given at the discretion of the physician. 7. A blood pressure cuff, EKG, and other monitors will often be applied during the procedure.  Some patients may need to have extra oxygen administered for a short period. 8. You will be asked to provide medical  information, including your allergies and medications, prior to the procedure.  We must know immediately if you are taking blood thinners (like Coumadin/Warfarin) or if you are allergic to IV iodine contrast (dye).  We must know if you could possible be pregnant.  9. Do not wear a high collared shirt or turtleneck.  Tie long hair up in the back if possible.  Possible side-effects:   Bleeding from needle site  Infection (rare, may require surgery)  Nerve injury (rare)  Hair on back of neck can be tinged with iodine scrub (this will wash out)  Light-headedness (temporary)  Pain at injection site (several days)  Decreased blood pressure (rare, temporary)  Seizure (very rare)  Call if you experience:   Hives or difficulty breathing ( go to the emergency room)  Inflammation or drainage at the injection site(s)  Please note:  Although the local anesthetic injected can often make your painful muscles or headache feel good for several hours after the injection, the pain may return.  It takes 3-7 days for steroids to work.  You may not notice any pain relief for at least one week.  If effective, we will often do a series of injections spaced 3-6 weeks apart to maximally decrease your pain.  If you have any questions, please call [phone] [facility]  

## 2019-05-08 ENCOUNTER — Telehealth: Payer: Self-pay | Admitting: Neurology

## 2019-05-08 NOTE — Telephone Encounter (Signed)
I called pt and she would like to come in for nerve block, made appt with AL/NP at 0900 for this.

## 2019-05-08 NOTE — Telephone Encounter (Signed)
Do you either of you have time to do nerve blocks on patient? She had great relief yesterday and I'm not back in the office until Monday. I'd like to try them a few more times before trying medications to see if we can help her.   I did bilateral occipital. I also did 2 injections right above the eyebrows on each side (I can send you a diagram) - supraorbital and supratrochlear nerves  If either of you have time maybe your nurse can call and schedule her?

## 2019-05-08 NOTE — Telephone Encounter (Signed)
I am happy to help. I have an opening at 9 but otherwise full. If she can come then we will take care of her.

## 2019-05-09 ENCOUNTER — Other Ambulatory Visit: Payer: Self-pay

## 2019-05-09 ENCOUNTER — Telehealth: Payer: Self-pay | Admitting: Neurology

## 2019-05-09 ENCOUNTER — Encounter: Payer: Self-pay | Admitting: Family Medicine

## 2019-05-09 ENCOUNTER — Ambulatory Visit (INDEPENDENT_AMBULATORY_CARE_PROVIDER_SITE_OTHER): Payer: Medicare Other | Admitting: Family Medicine

## 2019-05-09 DIAGNOSIS — G43711 Chronic migraine without aura, intractable, with status migrainosus: Secondary | ICD-10-CM

## 2019-05-09 NOTE — Progress Notes (Signed)
History: Taylor Adams has had persistent intractable migraine for 4 months. Last nerve blocked worked well for about 1 day. She returns to repeat nerve block.        Bupivicaine injection/Lidocaine protocol for occipital/supraorbital neuralgia   Performed by Debbora Presto, FNP.30-gauge needle was used. All procedures as documented were medically necessary, reasonable and appropriate based on the patient's history, medical diagnosis and physician opinion. Verbal informed consent was obtained from the patient, patient was informed of potential risk of procedure, including bruising, bleeding, hematoma formation, infection, muscle weakness, muscle pain, numbness, transient hypertension, transient hyperglycemia and transient insomnia among others. All areas injected were topically clean with isopropyl rubbing alcohol. Nonsterile nonlatex gloves were worn during the procedure.   1. Greater occipital nerve block [phone]). The greater occipital nerve site was identified at the nuchal line medial to the occipital artery. Medication was injected into the left and right occipital nerve areas and suboccipital areas. Patient's condition is associated with inflammation of the greater occipital nerve and associated multiple groups. Injection was deemed medically necessary, reasonable and appropriate. Injection represents a separate and unique surgical service.   2. Supraorbital nerve block (64400): Supraorbital nerve site was identified along the incision of the frontal bone on the orbital/supraorbital ridge. Medication was injected into the left and right supraorbital nerve areas. Patient's condition is associated with inflammation of the supraorbital and associated muscle groups. Injection was deemed medically necessary, reasonable and appropriate. Injection represents a separate and unique surgical service.   The patient tolerated the injections well, no complications of the procedure were noted. Injections were made with  a 30-gauge needle.

## 2019-05-09 NOTE — Progress Notes (Signed)
Nerve block w/o steroid: Pt signed consent  0.5% Bupivocaine 4.5 mL LOT: [id number] EXP: 05/2020 NDC: 15830-940-76  2% Lidocaine 4.5 mL LOT: [id number] EXP: 04/2020 NDC: 8088-1103-15

## 2019-05-09 NOTE — Telephone Encounter (Signed)
I spoke with Dr. Jaynee Eagles who had conversed with Taylor Adams in [place]. Taylor Adams just had nerve blocks today. Dr. Jaynee Eagles advises Taylor Adams update Dr. Jaynee Eagles on Monday 8/10 and will plan to repeat the nerve blocks later in the week if needed. I discussed this with the patient @ [phone] and she said that was great. She verbalized appreciation for the call.

## 2019-05-09 NOTE — Telephone Encounter (Signed)
Patient said she got a text stating Dr. Jaynee Eagles will see her on Monday for nerve block. No openings to be able to add her on. Best call back is [phone]

## 2019-05-13 NOTE — Progress Notes (Signed)
[facility] - Clinic Note  05/14/2019     CHIEF COMPLAINT Patient presents for Retina Evaluation and Diabetic Eye Exam   HISTORY OF PRESENT ILLNESS: Taylor Adams is a 68 y.o. female who presents to the clinic today for:   HPI    Retina Evaluation    In both eyes.  This started 3 months ago.  Associated Symptoms Photophobia.  Negative for Flashes, Pain, Trauma, Fever, Weight Loss, Scalp Tenderness, Redness, Floaters, Distortion, Jaw Claudication, Fatigue, Blind Spot, Glare and Shoulder/Hip pain.  Context:  distance vision, mid-range vision and near vision.  Treatments tried include surgery and laser.  I, the attending physician,  performed the HPI with the patient and updated documentation appropriately.          Diabetic Eye Exam    Vision fluctuates with blood sugars.  Associated Symptoms Negative for Flashes, Pain, Trauma, Fever, Floaters, Redness, Scalp Tenderness, Weight Loss, Fatigue, Jaw Claudication, Photophobia, Distortion, Blind Spot, Glare and Shoulder/Hip pain.  Diabetes characteristics include Type 2.  This started 40 years ago.  Blood sugar level fluctuates.  Last Blood Glucose 110.  Last A1C 6.4.  I, the attending physician,  performed the HPI with the patient and updated documentation appropriately.          Comments    Self referral for DME .Patient reports  had sinus infection in March, was given prednisone and Levaquin , she continue to have headaches , she was referred to a neurologist who gave her lidocaine injections in her forehead and around her head, the headaches continued, she then was referred to DR. Aheran who dx with migraines.Pt denies flashes, floaters and ocular pain but feels something is "blocking her vision in her left eye. Pt is DM2 x40 yrs , BS 110 this am, AC 6.4, BS fluctuantes , BS are controlled by insulin pump . Patient reports she had cataract sx 2017 ou and  2018  had scar tissue removed--yag cap ou--with Dr. Darel HongBeavis.        Last edited by Rennis ChrisZamora, Dontarious Schaum, MD on 05/15/2019  1:38 PM. (History)    pt states she started having what she thought was allergies back in march and April, she states she was taking zyrtec, but she ended up having a lot of sinus pressure and having headaches, she states she took prednisone and levoquin, but the prednisone made her blood sugar increase, she states she saw a neurologist and had a brain MRI, but it did not show anything, she states she is getting injections in her head for the headaches, her dr thinks having to wear a mask triggered the headaches, pt states she has something over her left eye, she states she thinks it's scar tissue or glaucoma, but she wants it lasered off, pt states the sensation of the film coincides with her migraines  Referring physician: Gaspar Garbeisovec, Richard W, MD [address] [place] [id number]  HISTORICAL INFORMATION:   Selected notes from the MEDICAL RECORD NUMBER Referred by self for DM exam LEE:  Ocular Hx- PMH-   CURRENT MEDICATIONS: No current outpatient medications on file. (Ophthalmic Drugs)   No current facility-administered medications for this visit.  (Ophthalmic Drugs)   Current Outpatient Medications (Other)  Medication Sig  . ALPRAZolam (XANAX) 0.5 MG tablet Take 1 tablet (0.5 mg total) by mouth 2 (two) times daily as needed for anxiety.  . B-D ULTRA-FINE 33 LANCETS MISC   . BAYER CONTOUR NEXT TEST test strip   .  Biotin 5000 MCG TABS Take 5,000 mcg by mouth daily.   . budesonide-formoterol (SYMBICORT) 80-4.5 MCG/ACT inhaler Inhale 2 puffs into the lungs 2 (two) times daily.  . Calcium Carbonate-Vitamin D (CALTRATE 600+D PO) Take 1 tablet by mouth daily.  . Cholecalciferol (VITAMIN D3) 1000 units CAPS Take 1,000 Units by mouth 2 (two) times daily.   . Chromium Picolinate 800 MCG TABS Take 800 mcg by mouth daily.  . cyclobenzaprine (FLEXERIL) 10 MG tablet Take 10 mg by mouth as needed.  Marland Kitchen GLUCAGEN HYPOKIT 1 MG SOLR injection USE AS  NEEDED FOR SEVERE HYPOGLYCEMIA  . hydrOXYzine (ATARAX/VISTARIL) 25 MG tablet Take 25 mg by mouth 3 (three) times daily as needed.  . hyoscyamine (LEVSIN SL) 0.125 MG SL tablet Place 0.125 mg under the tongue every 4 (four) hours as needed for cramping.   . Insulin Human (INSULIN PUMP) 100 unit/ml SOLN Inject into the skin continuous. Novolog 100 unit/mL Basal Rate: 12 am to 4:30 am  0.375 4:30 am to 8 am   0.45 8 am to 12 pm    0.525 12 pm to 3 pm  0.45 3 pm to 9 pm 0.475 9 pm to 12 am 0.425  . losartan (COZAAR) 25 MG tablet Take 50 mg by mouth daily.  . magnesium oxide (MAG-OX) 400 MG tablet Take 400 mg by mouth at bedtime.  . Multiple Vitamin (MULTIVITAMIN) tablet Take 1 tablet by mouth daily. Women's 50+  . oxyCODONE-acetaminophen (PERCOCET) 10-325 MG tablet Take prn up to bid for severe pain.  . Probiotic Product (VSL#3) CAPS Take 1 capsule by mouth daily.  . TRAMADOL HCL PO Take by mouth as needed.  . frovatriptan (FROVA) 2.5 MG tablet Take 1 tablet (2.5 mg total) by mouth 2 (two) times daily. Twice daily for 5 days.   No current facility-administered medications for this visit.  (Other)      REVIEW OF SYSTEMS: ROS    Positive for: Endocrine, Eyes   Negative for: Constitutional, Gastrointestinal, Neurological, Skin, Genitourinary, Musculoskeletal, HENT, Cardiovascular, Respiratory, Psychiatric, Allergic/Imm, Heme/Lymph   Last edited by Zenovia Jordan, LPN on 03/29/349  0:93 AM. (History)       ALLERGIES Allergies  Allergen Reactions  . Codeine Itching    Pt denies this is an allergy  . Latex Rash    PAST MEDICAL HISTORY Past Medical History:  Diagnosis Date  . Anxiety   . Arthritis   . Benign colon polyp   . Cataracts, bilateral   . Depression   . Diabetes mellitus   . Diarrhea   . Gastroparesis   . Headache   . Intestinal bacterial overgrowth   . PONV (postoperative nausea and vomiting)    history of n/v  . SBO (small bowel obstruction) (HCC)    Past  Surgical History:  Procedure Laterality Date  . ABDOMINAL HYSTERECTOMY    . APPENDECTOMY    . BREAST EXCISIONAL BIOPSY Bilateral    benign  . BREAST SURGERY     biopsy  . CATARACT EXTRACTION Bilateral 2017   Dr. Talbert Forest  . CATARACT EXTRACTION, BILATERAL    . EYE SURGERY    . ILEOCECETOMY  10/07/2013   Procedure: Pennie Rushing;  Surgeon: Ralene Ok, MD;  Location: [place];  Service: General;;  . LAPAROTOMY N/A 10/07/2013   Procedure: EXPLORATORY LAPAROTOMY;  Surgeon: Ralene Ok, MD;  Location: [place];  Service: General;  Laterality: N/A;  . LYSIS OF ADHESION  10/07/2013   Procedure: LYSIS OF ADHESION;  Surgeon: Ralene Ok, MD;  Location: [facility];  Service: General;;  . NASAL SEPTOPLASTY W/ TURBINOPLASTY Bilateral 12/28/2016   Procedure: NASAL SEPTOPLASTY WITH  BILATARAL TURBINATE REDUCTION;  Surgeon: Newman PiesSu Teoh, MD;  Location: [facility];  Service: ENT;  Laterality: Bilateral;  . resection of lymph node in neck    . ROTATOR CUFF REPAIR     right side  . SINUS ENDO WITH FUSION Bilateral 12/28/2016   Procedure: ENDOSCOPIC TOTAL ETHMOIDECTOMY,ENDOSCOPIC MAXILLARY ANTROSTOMY ;  Surgeon: Newman PiesSu Teoh, MD;  Location: [facility];  Service: ENT;  Laterality: Bilateral;  . YAG LASER APPLICATION Bilateral 2018    FAMILY HISTORY Family History  Problem Relation Age of Onset  . Colon polyps Father   . Heart disease Father   . Diabetes Father   . Diabetes Mother   . Heart disease Mother   . Heart disease Brother   . Allergies Brother   . Allergies Sister   . Colon cancer Neg Hx   . Rectal cancer Neg Hx   . Stomach cancer Neg Hx   . Esophageal cancer Neg Hx     SOCIAL HISTORY Social History   Tobacco Use  . Smoking status: Never Smoker  . Smokeless tobacco: Never Used  Substance Use Topics  . Alcohol use: Not Currently    Alcohol/week: 0.0 standard drinks  . Drug use: No         OPHTHALMIC EXAM:  Base Eye Exam    Visual Acuity (Snellen - Linear)      Right Left   Dist [place] 20/20 -2 20/20    Dist ph [place] 20/20 -1        Tonometry (Tonopen, 8:30 AM)      Right Left   Pressure 15 12       Pupils      Dark Light Shape React APD   Right 4 3 Round Brisk None   Left 4 3 Round Brisk None       Visual Fields      Left Right   Restrictions Partial outer inferior temporal deficiency Partial outer inferior temporal deficiency       Extraocular Movement      Right Left    Full, Ortho Full, Ortho       Neuro/Psych    Oriented x3: Yes   Mood/Affect: Normal       Dilation    Both eyes: 1.0% Mydriacyl, 2.5% Phenylephrine @ 8:30 AM        Slit Lamp and Fundus Exam    Slit Lamp Exam      Right Left   Lids/Lashes Dermatochalasis - upper lid Dermatochalasis - upper lid   Conjunctiva/Sclera White and quiet White and quiet   Cornea Well healed temporal cataract wounds, trace Punctate epithelial erosions Well healed temporal cataract wounds, trace Punctate epithelial erosions   Anterior Chamber Deep and quiet Deep and quiet   Iris Round and dilated, No NVI Round and dilated, No NVI   Lens PC IOL in good position PC IOL in good position with open PC   Vitreous Vitreous syneresis Vitreous syneresis       Fundus Exam      Right Left   Disc Compact, mild temporal Peripapillary atrophy Compact, mild tilt, temporal PPA   C/D Ratio 0.1 0.3   Macula Flat, Blunted foveal reflex, mild Retinal pigment epithelial mottling, No heme or edema Flat, Good foveal reflex, mild Retinal pigment epithelial mottling, No heme or edema   Vessels Mild Vascular attenuation, mild Tortuousity Mild Vascular  attenuation, mild Tortuousity   Periphery Attached, scattered MA, cluster of MA/DBH temporal periphery Attached, cluster of MA temporal          Refraction    Manifest Refraction      Sphere Cylinder Axis Dist VA   Right +0.25 Sphere  20/20-2   Left -0.25 +0.25 085 20/20-1          IMAGING AND PROCEDURES  Imaging and Procedures for @TODAY @  OCT, Retina - OU - Both Eyes        Right Eye Quality was good. Central Foveal Thickness: 298. Progression has no prior data. Findings include normal foveal contour, no IRF, no SRF.   Left Eye Quality was good. Central Foveal Thickness: 300. Progression has no prior data. Findings include normal foveal contour, no IRF, no SRF.   Notes *Images captured and stored on drive  Diagnosis / Impression:  NFP, no IRF/SRF OU No DME OU  Clinical management:  See below  Abbreviations: NFP - Normal foveal profile. CME - cystoid macular edema. PED - pigment epithelial detachment. IRF - intraretinal fluid. SRF - subretinal fluid. EZ - ellipsoid zone. ERM - epiretinal membrane. ORA - outer retinal atrophy. ORT - outer retinal tubulation. SRHM - subretinal hyper-reflective material                 ASSESSMENT/PLAN:    ICD-10-CM   1. Mild nonproliferative diabetic retinopathy of both eyes without macular edema associated with type 1 diabetes mellitus (HCC)  [id number]   2. Retinal edema  H35.81 OCT, Retina - OU - Both Eyes  3. Essential hypertension  I10   4. Hypertensive retinopathy of both eyes  H35.033   5. Pseudophakia of both eyes  Z96.1   6. History of myopia  Z86.69   7. Intractable chronic migraine without aura and with status migrainosus  G43.711   8. Visual disturbances  H53.9     1,2. Diabetes mellitus, type 1 with mild nonproliferative diabetic retinopathy OU  - exam shows mild peripheral MA OU, no DME OU  - The incidence, risk factors for progression, natural history and treatment options for diabetic retinopathy  were discussed with patient.    - The need for close monitoring of blood glucose, blood pressure, and serum lipids, avoiding cigarette or any type of tobacco, and the need for long term follow up was also discussed with patient.  - f/u in 3 months, sooner prn  3,4. Hypertensive retinopathy OU  - discussed importance of tight BP control  - monitor  5. Pseudophakia OU  - s/p CE/IOL OU  ([facility])  - s/p YAG cap OS ([facility])  - beautiful surgeries, doing well  - monitor  6. History of Myopia - high myopia prior to cataract surgery - myopic contour noted on OCT  7,8. Migraine with visual disturbances OS - pt with history of intractable migraine w/ complaint of "fog/film" in left visual field - following closely with Neurology - nothing on dilated eye exam to correlate visual disturbance and on our objective testing is seeing 20/20 and no significant ocular findings - recommend continued management with Neurology    Ophthalmic Meds Ordered this visit:  No orders of the defined types were placed in this encounter.      Return in about 3 months (around 08/14/2019) for f/u DM exam, DFE, OCT.  There are no Patient Instructions on file for this visit.   Explained the diagnoses, plan, and follow up with the patient and they expressed understanding.  Patient expressed understanding of the importance of proper follow up care.   This document serves as a record of services personally performed by Karie Chimera, MD, PhD. It was created on their behalf by Laurian Brim, OA, an ophthalmic assistant. The creation of this record is the provider's dictation and/or activities during the visit.    Electronically signed by: Laurian Brim, OA  08.10.2020 1:40 PM    Karie Chimera, M.D., Ph.D. Diseases & Surgery of the Retina and Vitreous [facility]  I have reviewed the above documentation for accuracy and completeness, and I agree with the above. Karie Chimera, M.D., Ph.D. 05/15/19 1:40 PM     Abbreviations: M myopia (nearsighted); A astigmatism; H hyperopia (farsighted); P presbyopia; Mrx spectacle prescription;  CTL contact lenses; OD right eye; OS left eye; OU both eyes  XT exotropia; ET esotropia; PEK punctate epithelial keratitis; PEE punctate epithelial erosions; DES dry eye syndrome; MGD meibomian gland dysfunction; ATs artificial tears; PFAT's  preservative free artificial tears; NSC nuclear sclerotic cataract; PSC posterior subcapsular cataract; ERM epi-retinal membrane; PVD posterior vitreous detachment; RD retinal detachment; DM diabetes mellitus; DR diabetic retinopathy; NPDR non-proliferative diabetic retinopathy; PDR proliferative diabetic retinopathy; CSME clinically significant macular edema; DME diabetic macular edema; dbh dot blot hemorrhages; CWS cotton wool spot; POAG primary open angle glaucoma; C/D cup-to-disc ratio; HVF humphrey visual field; GVF goldmann visual field; OCT optical coherence tomography; IOP intraocular pressure; BRVO Branch retinal vein occlusion; CRVO central retinal vein occlusion; CRAO central retinal artery occlusion; BRAO branch retinal artery occlusion; RT retinal tear; SB scleral buckle; PPV pars plana vitrectomy; VH Vitreous hemorrhage; PRP panretinal laser photocoagulation; IVK intravitreal kenalog; VMT vitreomacular traction; MH Macular hole;  NVD neovascularization of the disc; NVE neovascularization elsewhere; AREDS age related eye disease study; ARMD age related macular degeneration; POAG primary open angle glaucoma; EBMD epithelial/anterior basement membrane dystrophy; ACIOL anterior chamber intraocular lens; IOL intraocular lens; PCIOL posterior chamber intraocular lens; Phaco/IOL phacoemulsification with intraocular lens placement; PRK photorefractive keratectomy; LASIK laser assisted in situ keratomileusis; HTN hypertension; DM diabetes mellitus; COPD chronic obstructive pulmonary disease

## 2019-05-14 ENCOUNTER — Other Ambulatory Visit: Payer: Self-pay

## 2019-05-14 ENCOUNTER — Ambulatory Visit (INDEPENDENT_AMBULATORY_CARE_PROVIDER_SITE_OTHER): Payer: Medicare Other | Admitting: Ophthalmology

## 2019-05-14 ENCOUNTER — Encounter (INDEPENDENT_AMBULATORY_CARE_PROVIDER_SITE_OTHER): Payer: Self-pay | Admitting: Ophthalmology

## 2019-05-14 DIAGNOSIS — E103293 Type 1 diabetes mellitus with mild nonproliferative diabetic retinopathy without macular edema, bilateral: Secondary | ICD-10-CM | POA: Diagnosis not present

## 2019-05-14 DIAGNOSIS — H35033 Hypertensive retinopathy, bilateral: Secondary | ICD-10-CM

## 2019-05-14 DIAGNOSIS — I10 Essential (primary) hypertension: Secondary | ICD-10-CM

## 2019-05-14 DIAGNOSIS — Z8669 Personal history of other diseases of the nervous system and sense organs: Secondary | ICD-10-CM | POA: Diagnosis not present

## 2019-05-14 DIAGNOSIS — Z961 Presence of intraocular lens: Secondary | ICD-10-CM | POA: Diagnosis not present

## 2019-05-14 DIAGNOSIS — H3581 Retinal edema: Secondary | ICD-10-CM | POA: Diagnosis not present

## 2019-05-14 DIAGNOSIS — G43711 Chronic migraine without aura, intractable, with status migrainosus: Secondary | ICD-10-CM | POA: Diagnosis not present

## 2019-05-14 DIAGNOSIS — H539 Unspecified visual disturbance: Secondary | ICD-10-CM | POA: Diagnosis not present

## 2019-05-15 ENCOUNTER — Telehealth: Payer: Self-pay | Admitting: Family Medicine

## 2019-05-15 ENCOUNTER — Telehealth: Payer: Self-pay | Admitting: Neurology

## 2019-05-15 ENCOUNTER — Encounter (INDEPENDENT_AMBULATORY_CARE_PROVIDER_SITE_OTHER): Payer: Self-pay | Admitting: Ophthalmology

## 2019-05-15 ENCOUNTER — Ambulatory Visit (INDEPENDENT_AMBULATORY_CARE_PROVIDER_SITE_OTHER): Payer: Medicare Other | Admitting: Neurology

## 2019-05-15 ENCOUNTER — Other Ambulatory Visit: Payer: Self-pay | Admitting: Neurology

## 2019-05-15 VITALS — BP 130/70 | HR 72 | Temp 98.2°F

## 2019-05-15 DIAGNOSIS — G43711 Chronic migraine without aura, intractable, with status migrainosus: Secondary | ICD-10-CM

## 2019-05-15 MED ORDER — FROVATRIPTAN SUCCINATE 2.5 MG PO TABS: 2.5000 mg | ORAL_TABLET | Freq: Two times a day (BID) | ORAL | 3 refills | Status: DC

## 2019-05-15 MED ORDER — NARATRIPTAN HCL 2.5 MG PO TABS: ORAL_TABLET | ORAL | 3 refills | Status: DC

## 2019-05-15 NOTE — Telephone Encounter (Signed)
[id number] - is asking for a call about frovatriptan (FROVA) 2.5 MG tablet

## 2019-05-15 NOTE — Telephone Encounter (Signed)
Hinton Dyer, please check into botox auth for this patient. Thank you!

## 2019-05-15 NOTE — Telephone Encounter (Signed)
I called in amerge I need something long acting. She can get a coupon off of goodrx. I already emailed her no need to call her

## 2019-05-15 NOTE — Progress Notes (Signed)
Nerve block w/o  steroid: Pt signed consent  0.5% Bupivocaine 4.5 mL LOT: [id number] EXP: 05/2020 NDC: 94503-888-28  2% Lidocaine 4.5 mL LOT: [id number] EXP: 04/02/2020 NDC: 0034-9179-15

## 2019-05-15 NOTE — Telephone Encounter (Signed)
Spoke with Nali @ [facility]. She has been in touch with the patient. Frovatriptan is too expensive for patient, even 9 tablets on goodrx. She stated pt is aware they are trying to get the medication switched. I advised message would be sent to Dr. Jaynee Eagles for alternative.

## 2019-05-15 NOTE — Progress Notes (Signed)
[facility]    Provider:  Dr Lucia GaskinsAhern Requesting Provider: Melvyn Novasarmen Dohmeier Primary Care Provider:  Gaspar Garbeisovec, Richard W, MD  CC:  Occipital neuralgia  Interval history 05/15/2019: patient here for follow up of occipital neuralgia and chronic migraines. She has had 2 nerve blocks with great results but did not last more than 24 hours before the pain returned. She has daily headaches. 24 x7, every day, only one day she did not have a migraine. Migrainous features. She has a lot of neck pain, the pain radiates from the back of the head all the way to the top and sides of the head, tender to the touch, can be brief and stabbing and shooting but has headaches and migraines in between episodes, severe, daily, continuous and severely affecting her life. MRi of the brain was unremarkable (wo contrast). We discussed her options again, oral medication, CGRP, will start Botox.   HPI:  Taylor Adams is a 68 y.o. female here as requested by Tisovec, Adelfa Kohichard W, MD for migraines and headaches. She had migraines at the age of 89 and they were severe and they lasted for 2 months and then would get one sporadically had not had one in years. In March/April she started taking Zyrtec for sinus problems, she started having pressure behind the eyes. On June 19th she started an antibiotic. She took augmentin and pressure never became better. She saw ENT and her sinuses were full of mucous but she had a CT scan and he recommended Dr. Vickey Hugerohmeier. She has a headache right now, it is behind the eyes and radiates to the right back of the head. Like her head is in a vice grip. Daily. 24 x7, every day, only one day she did not have a migraine. Migrainous features. Percocet makes it tolerable.  She has a lot of neck pain, the pain radiates from the back of the head all the way to the top and sides of the head, tender to the touch, can be brief and stabbing and shooting but has headache in between episodes, severe, daily,  continuous and severely affecting her life.  Review of Systems: Patient complains of symptoms per HPI as well as the following symptoms: no CP, no SOB, no vision changes. Pertinent negatives and positives per HPI. All others negative.   Social History   Socioeconomic History   Marital status: Married    Spouse name: Not on file   Number of children: Not on file   Years of education: Not on file   Highest education level: Not on file  Occupational History   Not on file  Social Needs   Financial resource strain: Not on file   Food insecurity    Worry: Not on file    Inability: Not on file   Transportation needs    Medical: Not on file    Non-medical: Not on file  Tobacco Use   Smoking status: Never Smoker   Smokeless tobacco: Never Used  Substance and Sexual Activity   Alcohol use: Not Currently    Alcohol/week: 0.0 standard drinks   Drug use: No   Sexual activity: Not on file  Lifestyle   Physical activity    Days per week: Not on file    Minutes per session: Not on file   Stress: Not on file  Relationships   Social connections    Talks on phone: Not on file    Gets together: Not on file    Attends religious service: Not  on file    Active member of club or organization: Not on file    Attends meetings of clubs or organizations: Not on file    Relationship status: Not on file   Intimate partner violence    Fear of current or ex partner: Not on file    Emotionally abused: Not on file    Physically abused: Not on file    Forced sexual activity: Not on file  Other Topics Concern   Not on file  Social History Narrative   Lives at home with her husband   Right handed   Caffeine: 2 cups per day    Family History  Problem Relation Age of Onset   Colon polyps Father    Heart disease Father    Diabetes Father    Diabetes Mother    Heart disease Mother    Heart disease Brother    Allergies Brother    Allergies Sister    Colon cancer  Neg Hx    Rectal cancer Neg Hx    Stomach cancer Neg Hx    Esophageal cancer Neg Hx     Past Medical History:  Diagnosis Date   Anxiety    Arthritis    Benign colon polyp    Cataracts, bilateral    Depression    Diabetes mellitus    Diarrhea    Gastroparesis    Headache    Intestinal bacterial overgrowth    PONV (postoperative nausea and vomiting)    history of n/v   SBO (small bowel obstruction) (HCC)     Patient Active Problem List   Diagnosis Date Noted   Chronic migraine without aura, with intractable migraine, so stated, with status migrainosus 05/14/2019   Cough variant asthma vs UACS 02/01/2018   SBO (small bowel obstruction) (HCC) 10/06/2013   DIABETES MELLITUS 05/29/2008   GASTROPARESIS 05/29/2008   COLONIC POLYPS, BENIGN, HX OF 05/29/2008    Past Surgical History:  Procedure Laterality Date   ABDOMINAL HYSTERECTOMY     APPENDECTOMY     BREAST EXCISIONAL BIOPSY Bilateral    benign   BREAST SURGERY     biopsy   CATARACT EXTRACTION Bilateral 2017   Dr. Vonna KotykBevis   CATARACT EXTRACTION, BILATERAL     EYE SURGERY     ILEOCECETOMY  10/07/2013   Procedure: Ferne CoeILEOCECETOMY;  Surgeon: Axel FillerArmando Ramirez, MD;  Location: [facility] OR;  Service: General;;   LAPAROTOMY N/A 10/07/2013   Procedure: EXPLORATORY LAPAROTOMY;  Surgeon: Axel FillerArmando Ramirez, MD;  Location: [facility] OR;  Service: General;  Laterality: N/A;   LYSIS OF ADHESION  10/07/2013   Procedure: LYSIS OF ADHESION;  Surgeon: Axel FillerArmando Ramirez, MD;  Location: [facility] OR;  Service: General;;   NASAL SEPTOPLASTY W/ TURBINOPLASTY Bilateral 12/28/2016   Procedure: NASAL SEPTOPLASTY WITH  BILATARAL TURBINATE REDUCTION;  Surgeon: Newman PiesSu Teoh, MD;  Location: [facility] OR;  Service: ENT;  Laterality: Bilateral;   resection of lymph node in neck     ROTATOR CUFF REPAIR     right side   SINUS ENDO WITH FUSION Bilateral 12/28/2016   Procedure: ENDOSCOPIC TOTAL ETHMOIDECTOMY,ENDOSCOPIC MAXILLARY ANTROSTOMY ;  Surgeon: Newman PiesSu Teoh, MD;   Location: [facility] OR;  Service: ENT;  Laterality: Bilateral;   YAG LASER APPLICATION Bilateral 2018    Current Outpatient Medications  Medication Sig Dispense Refill   ALPRAZolam (XANAX) 0.5 MG tablet Take 1 tablet (0.5 mg total) by mouth 2 (two) times daily as needed for anxiety. 30 tablet 0   B-D ULTRA-FINE 33 LANCETS MISC  BAYER CONTOUR NEXT TEST test strip      Biotin 5000 MCG TABS Take 5,000 mcg by mouth daily.      budesonide-formoterol (SYMBICORT) 80-4.5 MCG/ACT inhaler Inhale 2 puffs into the lungs 2 (two) times daily. 1 Inhaler 11   Calcium Carbonate-Vitamin D (CALTRATE 600+D PO) Take 1 tablet by mouth daily.     Cholecalciferol (VITAMIN D3) 1000 units CAPS Take 1,000 Units by mouth 2 (two) times daily.      Chromium Picolinate 800 MCG TABS Take 800 mcg by mouth daily.     cyclobenzaprine (FLEXERIL) 10 MG tablet Take 10 mg by mouth as needed.     frovatriptan (FROVA) 2.5 MG tablet Take 1 tablet (2.5 mg total) by mouth 2 (two) times daily. Twice daily for 5 days. 10 tablet 3   GLUCAGEN HYPOKIT 1 MG SOLR injection USE AS NEEDED FOR SEVERE HYPOGLYCEMIA  6   hydrOXYzine (ATARAX/VISTARIL) 25 MG tablet Take 25 mg by mouth 3 (three) times daily as needed.     hyoscyamine (LEVSIN SL) 0.125 MG SL tablet Place 0.125 mg under the tongue every 4 (four) hours as needed for cramping.      Insulin Human (INSULIN PUMP) 100 unit/ml SOLN Inject into the skin continuous. Novolog 100 unit/mL Basal Rate: 12 am to 4:30 am  0.375 4:30 am to 8 am   0.45 8 am to 12 pm    0.525 12 pm to 3 pm  0.45 3 pm to 9 pm 0.475 9 pm to 12 am 0.425     losartan (COZAAR) 25 MG tablet Take 50 mg by mouth daily.     magnesium oxide (MAG-OX) 400 MG tablet Take 400 mg by mouth at bedtime.     Multiple Vitamin (MULTIVITAMIN) tablet Take 1 tablet by mouth daily. Women's 50+     oxyCODONE-acetaminophen (PERCOCET) 10-325 MG tablet Take prn up to bid for severe pain. 21 tablet 0   Probiotic Product (VSL#3)  CAPS Take 1 capsule by mouth daily. 30 capsule 11   TRAMADOL HCL PO Take by mouth as needed.     No current facility-administered medications for this visit.     Allergies as of 05/15/2019 - Review Complete 05/14/2019  Allergen Reaction Noted   Codeine Itching 04/15/2016   Latex Rash 04/15/2016    Vitals: BP 130/70 (BP Location: Left Arm, Patient Position: Sitting)    Pulse 72    Temp 98.2 F (36.8 C) Comment: taken by check-in staff Last Weight:  Wt Readings from Last 1 Encounters:  05/07/19 111 lb (50.3 kg)   Last Height:   Ht Readings from Last 1 Encounters:  05/07/19 5\' 2"  (1.575 m)     Physical exam: Exam: Gen: NAD, conversant, well nourised, obese, well groomed                     CV: RRR, no MRG. No Carotid Bruits. No peripheral edema, warm, nontender Eyes: Conjunctivae clear without exudates or hemorrhage  Neuro: Detailed Neurologic Exam  Speech:    Speech is normal; fluent and spontaneous with normal comprehension.  Cognition:    The patient is oriented to person, place, and time;     recent and remote memory intact;     language fluent;     normal attention, concentration,     fund of knowledge Cranial Nerves:    The pupils are equal, round, and reactive to light. The fundi are normal and spontaneous venous pulsations are present. Visual fields are  full to finger confrontation. Extraocular movements are intact. Trigeminal sensation is intact and the muscles of mastication are normal. The face is symmetric. The palate elevates in the midline. Hearing intact. Voice is normal. Shoulder shrug is normal. The tongue has normal motion without fasciculations.   Coordination:    Normal finger to nose and heel to shin. Normal rapid alternating movements.   Gait:    Heel-toe and tandem gait are normal.   Motor Observation:    No asymmetry, no atrophy, and no involuntary movements noted. Tone:    Normal muscle tone.    Posture:    Posture is normal. normal  erect    Strength:    Strength is V/V in the upper and lower limbs.      Sensation: intact to LT     Reflex Exam:  DTR's:    Deep tendon reflexes in the upper and lower extremities are normal bilaterally.   Toes:    The toes are downgoing bilaterally.   Clonus:    Clonus is absent.    Assessment/Plan: This is a 68 year old patient with chronic migraines and occipital neuralgia.  We had a long discussion today about options, medication, Botox, CGRP injectables or we could try nerve blocks to see if that will help with the occipital nerve pain.  She would like to try nerve blocks.  Her pain significantly improved today after occipital nerve blocks her pain went to 0.  We can try occipital nerve blocks several more times, may consider sending to Dr. Naaman PlummerFred Newton for radiofrequency ablation or other more permanent pain procedures on the occipital nerves if she receives good benefit but only temporarily, also may have to consider other options as above.  Cervical Myofascial Pain Syndrome; Dry Needling [place] - scheduled  Migraines and occipital nerve pain: 2 nerve blocks both occipital and supraorbital with great results however results do not last long. One more today but needs other treatment.  Start Botox. Consider Ajovy **.   Frova twice daily for 5 days  No orders of the defined types were placed in this encounter.   All procedures a documented blood were medically necessary, reasonable and appropriate based on the patient's history, medical diagnosis and physician opinion. Verbal informed consent was obtained from the patient, patient was informed of potential risk of procedure, including bruising, bleeding, hematoma formation, infection, muscle weakness, muscle pain, numbness, transient hypertension, transient hyperglycemia and transient insomnia among others. All areas injected were topically clean with isopropyl rubbing alcohol. Nonsterile nonlatex gloves were worn during the  procedure.  1. Greater occipital nerve block [phone](64405). The greater occipital nerve site was identified at the nuchal line medial to the occipital artery. Medication was injected into the left and right occipital nerve areas and suboccipital areas. Patient's condition is associated with inflammation of the greater occipital nerve and associated multiple groups. Injection was deemed medically necessary, reasonable and appropriate. Injection represents a separate and unique surgical service.  2. Supraorbital nerve block (64400): Supraorbital nerve site was identified along the incision of the frontal bone on the orbital/supraorbital ridge. Medication was injected into the left and right supraorbital nerve areas. Patient's condition is associated with inflammation of the supraorbital and associated muscle groups. Injection was deemed medically necessary, reasonable and appropriate. Injection represents a separate and unique surgical service.  A total of 40 minutes was spent face-to-face with this patient. Over half this time was spent on counseling patient on the  1. Chronic migraine without aura, with intractable migraine, so  stated, with status migrainosus    diagnosis and different diagnostic and therapeutic options, counseling and coordination of care, risks ans benefits of management, compliance, or risk factor reduction and education.  This does not include time spent on nerve blocks.  Cc: Tisovec, Fransico Him, MD,    Sarina Ill, MD  [facility] [address] [place], [place] [address]  Phone [phone] Fax [phone]

## 2019-05-15 NOTE — Telephone Encounter (Signed)
Noted thanks °

## 2019-05-15 NOTE — Patient Instructions (Addendum)
Ajovy once monthly Start botox Take frovatripan twice daily for 5 days. If insurance doesn't approve send me an email and we can try a different one.  Frovatriptan tablets What is this medicine? FROVATRIPTAN (froe va TRIP tan) is used to treat migraines with or without aura. An aura is a strange feeling or visual disturbance that warns you of an attack. It is not used to prevent migraines. This medicine may be used for other purposes; ask your health care provider or pharmacist if you have questions. COMMON BRAND NAME(S): Frova What should I tell my health care provider before I take this medicine? They need to know if you have any of these conditions:  cigarette smoker  circulation problems in fingers and toes  diabetes  heart disease  high blood pressure  high cholesterol  history of irregular heartbeat  history of stroke  kidney disease  liver disease  stomach or intestine problems  an unusual or allergic reaction to frovatriptan, other medicines, foods, dyes, or preservatives  pregnant or trying to get pregnant  breast-feeding How should I use this medicine? Take this medicine by mouth with a glass of water. Follow the directions on the prescription label. Do not take it more often than directed. Talk to your pediatrician regarding the use of this medicine in children. Special care may be needed. Overdosage: If you think you have taken too much of this medicine contact a poison control center or emergency room at once. NOTE: This medicine is only for you. Do not share this medicine with others. What if I miss a dose? This does not apply. This medicine is not for regular use. What may interact with this medicine? Do not take this medicine with any of the following medicines:  certain medicines for migraine headache like almotriptan, eletriptan, frovatriptan, naratriptan, rizatriptan, sumatriptan, zolmitriptan  ergot alkaloids like dihydroergotamine, ergonovine,  ergotamine, methylergonovine This medicine may also interact with the following medications:  certain medicines for depression, anxiety, or psychotic disorders  MAOIs like Carbex, Eldepryl, Marplan, Nardil, and Parnate This list may not describe all possible interactions. Give your health care provider a list of all the medicines, herbs, non-prescription drugs, or dietary supplements you use. Also tell them if you smoke, drink alcohol, or use illegal drugs. Some items may interact with your medicine. What should I watch for while using this medicine? Visit your healthcare professional for regular checks on your progress. Tell your healthcare professional if your symptoms do not start to get better or if they get worse. You may get drowsy or dizzy. Do not drive, use machinery, or do anything that needs mental alertness until you know how this medicine affects you. Do not stand up or sit up quickly, especially if you are an older patient. This reduces the risk of dizzy or fainting spells. Alcohol may interfere with the effect of this medicine. Your mouth may get dry. Chewing sugarless gum or sucking hard candy and drinking plenty of water may help. Contact your healthcare professional if the problem does not go away or is severe. Tell your healthcare professional right away if you have any change in your eyesight. If you take migraine medicines for 10 or more days a month, your migraines may get worse. Keep a diary of headache days and medicine use. Contact your healthcare professional if your migraine attacks occur more frequently. What side effects may I notice from receiving this medicine? Side effects that you should report to your doctor or health care professional as  soon as possible:  allergic reactions like skin rash, itching or hives, swelling of the face, lips, or tongue  changes in vision  chest pain or chest tightness  signs and symptoms of a dangerous change in heartbeat or heart  rhythm like chest pain; dizziness; fast, irregular heartbeat; palpitations; feeling faint or lightheaded; falls; breathing problems  signs and symptoms of a stroke like changes in vision; confusion; trouble speaking or understanding; severe headaches; sudden numbness or weakness of the face, arm or leg; trouble walking; dizziness; loss of balance or coordination  signs and symptoms of serotonin syndrome like irritable; confusion; diarrhea; fast or irregular heartbeat; muscle twitching; stiff muscles; trouble walking; sweating; high fever; seizures; chills; vomiting Side effects that usually do not require medical attention (report to your doctor or health care professional if they continue or are bothersome):  diarrhea  dizziness  dry mouth  headache  nausea, vomiting  pain, tingling, numbness in the hands or feet  stomach pain This list may not describe all possible side effects. Call your doctor for medical advice about side effects. You may report side effects to FDA at 1-800-FDA-1088. Where should I keep my medicine? Keep out of the reach of children. Store at room temperature between 20 and 25 degrees C (68 and 77 degrees F). Protect from light and moisture. Throw away any unused medicine after the expiration date. NOTE: This sheet is a summary. It may not cover all possible information. If you have questions about this medicine, talk to your doctor, pharmacist, or health care provider.  2020 Elsevier/Gold Standard (2018-04-03 14:52:36)   Vernell BarrierFremanezumab injection  What is this medicine? FREMANEZUMAB (fre ma NEZ ue mab) is used to prevent migraine headaches. This medicine may be used for other purposes; ask your health care provider or pharmacist if you have questions. COMMON BRAND NAME(S): AJOVY What should I tell my health care provider before I take this medicine? They need to know if you have any of these conditions:  an unusual or allergic reaction to fremanezumab, other  medicines, foods, dyes, or preservatives  pregnant or trying to get pregnant  breast-feeding How should I use this medicine? This medicine is for injection under the skin. You will be taught how to prepare and give this medicine. Use exactly as directed. Take your medicine at regular intervals. Do not take your medicine more often than directed. It is important that you put your used needles and syringes in a special sharps container. Do not put them in a trash can. If you do not have a sharps container, call your pharmacist or healthcare provider to get one. Talk to your pediatrician regarding the use of this medicine in children. Special care may be needed. Overdosage: If you think you have taken too much of this medicine contact a poison control center or emergency room at once. NOTE: This medicine is only for you. Do not share this medicine with others. What if I miss a dose? If you miss a dose, take it as soon as you can. If it is almost time for your next dose, take only that dose. Do not take double or extra doses. What may interact with this medicine? Interactions are not expected. This list may not describe all possible interactions. Give your health care provider a list of all the medicines, herbs, non-prescription drugs, or dietary supplements you use. Also tell them if you smoke, drink alcohol, or use illegal drugs. Some items may interact with your medicine. What should I watch  for while using this medicine? Tell your doctor or healthcare professional if your symptoms do not start to get better or if they get worse. What side effects may I notice from receiving this medicine? Side effects that you should report to your doctor or health care professional as soon as possible:  allergic reactions like skin rash, itching or hives, swelling of the face, lips, or tongue Side effects that usually do not require medical attention (report these to your doctor or health care professional if  they continue or are bothersome):  pain, redness, or irritation at site where injected This list may not describe all possible side effects. Call your doctor for medical advice about side effects. You may report side effects to FDA at 1-800-FDA-1088. Where should I keep my medicine? Keep out of the reach of children. You will be instructed on how to store this medicine. Throw away any unused medicine after the expiration date on the label. NOTE: This sheet is a summary. It may not cover all possible information. If you have questions about this medicine, talk to your doctor, pharmacist, or health care provider.  2020 Elsevier/Gold Standard (2017-06-19 17:22:56)

## 2019-05-20 ENCOUNTER — Other Ambulatory Visit: Payer: Self-pay

## 2019-05-20 ENCOUNTER — Encounter: Payer: Self-pay | Admitting: Physical Therapy

## 2019-05-20 ENCOUNTER — Ambulatory Visit: Payer: Medicare Other | Admitting: Family Medicine

## 2019-05-20 ENCOUNTER — Ambulatory Visit: Payer: Medicare Other | Attending: Neurology | Admitting: Physical Therapy

## 2019-05-20 DIAGNOSIS — M542 Cervicalgia: Secondary | ICD-10-CM

## 2019-05-20 DIAGNOSIS — R293 Abnormal posture: Secondary | ICD-10-CM | POA: Diagnosis not present

## 2019-05-20 DIAGNOSIS — G4459 Other complicated headache syndrome: Secondary | ICD-10-CM | POA: Diagnosis not present

## 2019-05-20 DIAGNOSIS — M6281 Muscle weakness (generalized): Secondary | ICD-10-CM | POA: Diagnosis not present

## 2019-05-20 NOTE — Telephone Encounter (Signed)
Noted, thank you

## 2019-05-20 NOTE — Therapy (Signed)
[facility] [address], [place], [place], [address] Phone: [phone]   Fax:  [phone]  Physical Therapy Evaluation  Patient Details  Name: Daliah Chaudoin [facility] MRN: [id number] Date of Birth: November 05, 1950 Referring Provider (PT): Dr. Sarina Ill    Encounter Date: 05/20/2019  PT End of Session - 05/20/19 1306    Visit Number  1    Date for PT Re-Evaluation  07/15/19    Authorization Type  Medicare    PT Start Time  1108    PT Stop Time  1200    PT Time Calculation (min)  52 min    Activity Tolerance  Patient tolerated treatment well       Past Medical History:  Diagnosis Date  . Anxiety   . Arthritis   . Benign colon polyp   . Cataracts, bilateral   . Depression   . Diabetes mellitus   . Diarrhea   . Gastroparesis   . Headache   . Intestinal bacterial overgrowth   . PONV (postoperative nausea and vomiting)    history of n/v  . SBO (small bowel obstruction) (HCC)     Past Surgical History:  Procedure Laterality Date  . ABDOMINAL HYSTERECTOMY    . APPENDECTOMY    . BREAST EXCISIONAL BIOPSY Bilateral    benign  . BREAST SURGERY     biopsy  . CATARACT EXTRACTION Bilateral 2017   Dr. Talbert Forest  . CATARACT EXTRACTION, BILATERAL    . EYE SURGERY    . ILEOCECETOMY  10/07/2013   Procedure: Pennie Rushing;  Surgeon: Ralene Ok, MD;  Location: [place];  Service: General;;  . LAPAROTOMY N/A 10/07/2013   Procedure: EXPLORATORY LAPAROTOMY;  Surgeon: Ralene Ok, MD;  Location: [place];  Service: General;  Laterality: N/A;  . LYSIS OF ADHESION  10/07/2013   Procedure: LYSIS OF ADHESION;  Surgeon: Ralene Ok, MD;  Location: [place];  Service: General;;  . NASAL SEPTOPLASTY W/ TURBINOPLASTY Bilateral 12/28/2016   Procedure: NASAL SEPTOPLASTY WITH  BILATARAL TURBINATE REDUCTION;  Surgeon: Leta Baptist, MD;  Location: [facility];  Service: ENT;  Laterality: Bilateral;  . resection of lymph node in neck    . ROTATOR CUFF REPAIR      right side  . SINUS ENDO WITH FUSION Bilateral 12/28/2016   Procedure: ENDOSCOPIC TOTAL ETHMOIDECTOMY,ENDOSCOPIC MAXILLARY ANTROSTOMY ;  Surgeon: Leta Baptist, MD;  Location: [facility];  Service: ENT;  Laterality: Bilateral;  . YAG LASER APPLICATION Bilateral 6962    There were no vitals filed for this visit.   Subjective Assessment - 05/20/19 1108    Subjective  Migraine headaches since 3rd grade/8th grade and when she was diagnosed with diabetes for 2 months then went away.  In April, the migraines started again.  The doctor thought the masks might be causing.  Frontal head pressure and burning.  Right and left frontal region.  Stress in upper trap regions and upper back.  I always carried it in my neck.  24/7.  The doctor did Lidocaine injections which helped from Wed to Friday.  Then came back Sunday.  Today it's back to full force.   I haven't slept in 2 days.  Feels like something is in my left eye but the doctors say it's from the migraine.  Dizzness with head movement up or down.    Pertinent History  " I hate needles";  stopped exercising b/c of this (previously walked 3-4x/week and used hand weights);  Tupe 1 diabetes, cervical  stenosis 5 years ago;  osteopenia;  right rotator cuff repair;  left frozen shoulder    How long can you sit comfortably?  I don't enjoy using my Ipad anymore    Diagnostic tests  Sinus CT and MRI clear    Patient Stated Goals  I'm here b/c of the doctor's request;  relief;  later she states she would like to be able to turn her head more for driving    Currently in Pain?  Yes    Pain Score  5     Pain Location  Head    Pain Orientation  Right;Left    Pain Type  Chronic pain    Aggravating Factors   unable to identify any trigger    Pain Relieving Factors  doctor's injections in head         [facility] - 05/20/19 0001      Assessment   Medical Diagnosis  myofascial pain syndrome cervical     Referring Provider (PT)  Dr. Naomie DeanAntonia Ahern     Onset  Date/Surgical Date  01/02/19    Next MD Visit  Wednesday     Prior Therapy  for frozen shoulder and after rotator cuff surgery       Precautions   Precautions  None      Restrictions   Weight Bearing Restrictions  No      Balance Screen   Has the patient fallen in the past 6 months  No    Has the patient had a decrease in activity level because of a fear of falling?   No    Is the patient reluctant to leave their home because of a fear of falling?   No      Home Public house managernvironment   Living Environment  Private residence    Living Arrangements  Spouse/significant other      Prior Function   Level of Independence  Independent with basic ADLs    Vocation  Retired    Leisure  work in [employer], keeping grandkids, Firefighterbaking cookies       Observation/Other Assessments   Focus on Therapeutic Outcomes (FOTO)   45% limitation       Posture/Postural Control   Posture/Postural Control  Postural limitations    Postural Limitations  Forward head    Posture Comments  moderate      AROM   Overall AROM Comments  painful with cervical retraction     Cervical Flexion  42    Cervical Extension  20    Cervical - Right Side Bend  30    Cervical - Left Side Bend  10    Cervical - Right Rotation  34    Cervical - Left Rotation  27      Strength   Cervical Flexion  3+/5    Cervical Extension  3+/5      Palpation   Palpation comment  taut band right > left upper trap, bil suboccipitals, rhomboids, levator scap, paraspinals       Distraction Test   Findngs  Positive    Comment  relief                 Objective measurements completed on examination: See above findings.      [facility] Adult PT Treatment/Exercise - 05/20/19 0001      Moist Heat Therapy   Number Minutes Moist Heat  5 Minutes    Moist Heat Location  Cervical      Manual  Therapy   Manual Therapy  Soft tissue mobilization    Soft tissue mobilization  bil upper traps, cervical paraspinals, suboccipitals       Trigger  Point Dry Needling - 05/20/19 0001    Consent Given?  Yes    Education Handout Provided  Yes    Muscles Treated Head and Neck  Upper trapezius;Suboccipitals;Cervical multifidi    Other Dry Needling  bil suboccipitals    Upper Trapezius Response  Twitch reponse elicited;Palpable increased muscle length    Cervical multifidi Response  Palpable increased muscle length           PT Education - 05/20/19 1243    Education Details  dry needling info    Person(s) Educated  Patient    Methods  Explanation;Handout    Comprehension  Verbalized understanding       PT Short Term Goals - 05/20/19 1319      PT SHORT TERM GOAL #1   Title  The patient will demonstrate basic self care principles include posture correction, ROM, use of home heat and husband's TENS    Time  4    Period  Weeks    Status  New    Target Date  06/17/19      PT SHORT TERM GOAL #2   Title  The patient wil lhave improved cervical flexion to 45, extension to 25, left sidebending to 20 degrees needed for driving    Time  4    Period  Weeks    Status  New      PT SHORT TERM GOAL #3   Title  The patient will report a 25% improvement in intensity and frequency of headache for improved sleep and leisure activities    Time  4    Period  Weeks    Status  New        PT Long Term Goals - 05/20/19 1322      PT LONG TERM GOAL #1   Title  The patient will be independent in safe self progression of HEP    Time  8    Period  Weeks    Status  New    Target Date  07/15/19      PT LONG TERM GOAL #2   Title  The patient will report a 50% improvement in intensity/frequency of headaches with daily activities    Time  8    Period  Weeks    Status  New      PT LONG TERM GOAL #3   Title  The patient will have improved bil cervical sidebending to 35 degrees and rotation to 35 degrees bil for driving    Time  8    Period  Weeks    Status  New      PT LONG TERM GOAL #4   Title  The patient will be able to return to  leisure activities including working in her flowers, baking, walking with her husband and exercising with her weights    Time  8    Period  Weeks    Status  New      PT LONG TERM GOAL #5   Title  FOTO functional outcome score improved from 45% limitation to 39% indicating improved function with less pain    Time  8    Period  Weeks    Status  New             Plan - 05/20/19 1307  Clinical Impression Statement  The patient reports the return of migraine headaches in April.  She has a previous history of migraines in her youth that resolved until recently.  The doctor the pain is myofascial and she would benefit from physical therapy specifically dry needling.  She reports 24 hours/7 days a week frontal head pain with no known aggravating or relieving factors.  She has had to discontinue her exercise program and does not enjoy using her Ipad anymore b/c of the pain.   She has some dizziness with looking up and down.  She has not slept in 2 days.  Forward head sitting posture.  Taut band right > left upper traps, cervical paraspinals, rhomboids and suboccipitals.  Very limited cervical ROM in all planes.  Cervical muscles strength grossly 3+/5.  Positive response to cervical distraction.    Personal Factors and Comorbidities  Age;Past/Current Experience;Comorbidity 1;Comorbidity 2;Comorbidity 3+    Comorbidities  Type 1 diabetic;  osteopenia;  ENT issue with fluid; history of shoulder pathology;  diagnosed with cervical stenosis 5 years ago    Examination-Activity Limitations  Carry;Lift;Other    Examination-Participation Restrictions  Yard Work;Laundry;Cleaning;Driving    Stability/Clinical Decision Making  Evolving/Moderate complexity    Clinical Decision Making  Moderate    Rehab Potential  Good    PT Frequency  2x / week    PT Duration  8 weeks    PT Treatment/Interventions  ADLs/Self Care Home Management;Cryotherapy;Electrical Stimulation;Ultrasound;Traction;Moist Heat;Therapeutic  activities;Therapeutic exercise;Neuromuscular re-education;Manual techniques;Patient/family education;Dry needling;Taping    PT Next Visit Plan  assess response to DN#1;  manual therapy including soft tissue and cervical distraction;  melt method;  try ES/heat;  initiate gentle cervical stretching    Consulted and Agree with Plan of Care  Patient       Patient will benefit from skilled therapeutic intervention in order to improve the following deficits and impairments:  Decreased range of motion, Increased fascial restricitons, Dizziness, Increased muscle spasms, Pain, Impaired UE functional use, Decreased activity tolerance, Impaired perceived functional ability, Decreased strength, Postural dysfunction  Visit Diagnosis: 1. Cervicalgia   2. Other complicated headache syndrome   3. Abnormal posture   4. Muscle weakness (generalized)        Problem List Patient Active Problem List   Diagnosis Date Noted  . Chronic migraine without aura, with intractable migraine, so stated, with status migrainosus 05/14/2019  . Cough variant asthma vs UACS 02/01/2018  . SBO (small bowel obstruction) (HCC) 10/06/2013  . DIABETES MELLITUS 05/29/2008  . GASTROPARESIS 05/29/2008  . COLONIC POLYPS, BENIGN, HX OF 05/29/2008   Lavinia SharpsStacy Ricard Faulkner, PT 05/20/19 1:30 PM Phone: [phone] Fax: [phone] Vivien PrestoSimpson, Deyjah Kindel C 05/20/2019, 1:30 PM  [facility] [address], [address] [place], [place], [address] Phone: [phone]   Fax:  [phone]  Name: Dicie BeamWanda W Mulcahey MRN: [id number] Date of Birth: [birth year]

## 2019-05-20 NOTE — Patient Instructions (Signed)
     Trigger Point Dry Needling  . What is Trigger Point Dry Needling (DN)? o DN is a physical therapy technique used to treat muscle pain and dysfunction. Specifically, DN helps deactivate muscle trigger points (muscle knots).  o A thin filiform needle is used to penetrate the skin and stimulate the underlying trigger point. The goal is for a local twitch response (LTR) to occur and for the trigger point to relax. No medication of any kind is injected during the procedure.   . What Does Trigger Point Dry Needling Feel Like?  o The procedure feels different for each individual patient. Some patients report that they do not actually feel the needle enter the skin and overall the process is not painful. Very mild bleeding may occur. However, many patients feel a deep cramping in the muscle in which the needle was inserted. This is the local twitch response.   . How Will I feel after the treatment? o Soreness is normal, and the onset of soreness may not occur for a few hours. Typically this soreness does not last longer than two days.  o Bruising is uncommon, however; ice can be used to decrease any possible bruising.  o In rare cases feeling tired or nauseous after the treatment is normal. In addition, your symptoms may get worse before they get better, this period will typically not last longer than 24 hours.   . What Can I do After My Treatment? o Increase your hydration by drinking more water for the next 24 hours. o You may place ice or heat on the areas treated that have become sore, however, do not use heat on inflamed or bruised areas. Heat often brings more relief post needling. o You can continue your regular activities, but vigorous activity is not recommended initially after the treatment for 24 hours. o DN is best combined with other physical therapy such as strengthening, stretching, and other therapies.    Stacy Simpson PT [facility] [address], [address] [place], [place] [address] Phone # [phone] Fax [phone] 

## 2019-05-20 NOTE — Telephone Encounter (Signed)
Taylor Adams scheduled Her. She is sch. For this Wednesday with Dr. Jaynee Eagles.

## 2019-05-21 ENCOUNTER — Telehealth: Payer: Self-pay | Admitting: Neurology

## 2019-05-21 NOTE — Telephone Encounter (Signed)
Can we squeeze patient in for botox this week? She is medicare. thanks

## 2019-05-22 ENCOUNTER — Other Ambulatory Visit: Payer: Self-pay

## 2019-05-22 ENCOUNTER — Ambulatory Visit (INDEPENDENT_AMBULATORY_CARE_PROVIDER_SITE_OTHER): Payer: Medicare Other | Admitting: Neurology

## 2019-05-22 DIAGNOSIS — G43711 Chronic migraine without aura, intractable, with status migrainosus: Secondary | ICD-10-CM

## 2019-05-22 NOTE — Progress Notes (Signed)
Consent Form Botulism Toxin Injection For Chronic Migraine  She had dry needling but it hurt a lot. Today is our first botox. We will also initiate Ajovy because patient is suffering and it takes botox up to 3 injections for maximum efficacy.   Reviewed orally with patient, additionally signature is on file:  Botulism toxin has been approved by the Federal drug administration for treatment of chronic migraine. Botulism toxin does not cure chronic migraine and it may not be effective in some patients.  The administration of botulism toxin is accomplished by injecting a small amount of toxin into the muscles of the neck and head. Dosage must be titrated for each individual. Any benefits resulting from botulism toxin tend to wear off after 3 months with a repeat injection required if benefit is to be maintained. Injections are usually done every 3-4 months with maximum effect peak achieved by about 2 or 3 weeks. Botulism toxin is expensive and you should be sure of what costs you will incur resulting from the injection.  The side effects of botulism toxin use for chronic migraine may include:   -Transient, and usually mild, facial weakness with facial injections  -Transient, and usually mild, head or neck weakness with head/neck injections  -Reduction or loss of forehead facial animation due to forehead muscle weakness  -Eyelid drooping  -Dry eye  -Pain at the site of injection or bruising at the site of injection  -Double vision  -Potential unknown long term risks  Contraindications: You should not have Botox if you are pregnant, nursing, allergic to albumin, have an infection, skin condition, or muscle weakness at the site of the injection, or have myasthenia gravis, Lambert-Eaton syndrome, or ALS.  It is also possible that as with any injection, there may be an allergic reaction or no effect from the medication. Reduced effectiveness after repeated injections is sometimes seen and rarely  infection at the injection site may occur. All care will be taken to prevent these side effects. If therapy is given over a long time, atrophy and wasting in the muscle injected may occur. Occasionally the patient's become refractory to treatment because they develop antibodies to the toxin. In this event, therapy needs to be modified.  I have read the above information and consent to the administration of botulism toxin.    BOTOX PROCEDURE NOTE FOR MIGRAINE HEADACHE    Contraindications and precautions discussed with patient(above). Aseptic procedure was observed and patient tolerated procedure. Procedure performed by Dr. Artemio Alyoni Rahmel Nedved  The condition has existed for more than 6 months, and pt does not have a diagnosis of ALS, Myasthenia Gravis or Lambert-Eaton Syndrome.  Risks and benefits of injections discussed and pt agrees to proceed with the procedure.  Written consent obtained  These injections are medically necessary. Pt  receives good benefits from these injections. These injections do not cause sedations or hallucinations which the oral therapies may cause.  Description of procedure:  The patient was placed in a sitting position. The standard protocol was used for Botox as follows, with 5 units of Botox injected at each site:   -Procerus muscle, midline injection  -Corrugator muscle, bilateral injection  -Frontalis muscle, bilateral injection, with 2 sites each side, medial injection was performed in the upper one third of the frontalis muscle, in the region vertical from the medial inferior edge of the superior orbital rim. The lateral injection was again in the upper one third of the forehead vertically above the lateral limbus of the cornea,  1.5 cm lateral to the medial injection site.  - Levator Scapulae: 5 units bilaterally  -Temporalis muscle injection, 5 sites, bilaterally. The first injection was 3 cm above the tragus of the ear, second injection site was 1.5 cm to 3 cm up  from the first injection site in line with the tragus of the ear. The third injection site was 1.5-3 cm forward between the first 2 injection sites. The fourth injection site was 1.5 cm posterior to the second injection site. 5th site laterally in the temporalis  muscleat the level of the outer canthus.  - Patient feels her clenching is a trigger for headaches. +5 units masseter bilaterally   - Patient feels the migraines are centered around the eyes +5 units bilaterally at the outer canthus in the orbicularis occuli  -Occipitalis muscle injection, 3 sites, bilaterally. The first injection was done one half way between the occipital protuberance and the tip of the mastoid process behind the ear. The second injection site was done lateral and superior to the first, 1 fingerbreadth from the first injection. The third injection site was 1 fingerbreadth superiorly and medially from the first injection site.  -Cervical paraspinal muscle injection, 2 sites, bilateral knee first injection site was 1 cm from the midline of the cervical spine, 3 cm inferior to the lower border of the occipital protuberance. The second injection site was 1.5 cm superiorly and laterally to the first injection site.  -Trapezius muscle injection was performed at 3 sites, bilaterally. The first injection site was in the upper trapezius muscle halfway between the inflection point of the neck, and the acromion. The second injection site was one half way between the acromion and the first injection site. The third injection was done between the first injection site and the inflection point of the neck.   Will return for repeat injection in 3 months.   A 200 unit sof Botox was used, any Botox not injected was wasted. The patient tolerated the procedure well, there were no complications of the above procedure.

## 2019-05-22 NOTE — Progress Notes (Signed)
Botox- 100 units x 2 vials Lot: [id number] Expiration: 09/2021 NDC: 0023-1145-01  Bacteriostatic 0.9% Sodium Chloride- 4mL total Lot: [id number] Expiration: 07/04/2019 NDC: 0409-1966-02  Dx: G43.711 B/B   

## 2019-05-24 ENCOUNTER — Encounter: Payer: Self-pay | Admitting: Physical Therapy

## 2019-05-24 ENCOUNTER — Other Ambulatory Visit: Payer: Self-pay

## 2019-05-24 ENCOUNTER — Ambulatory Visit: Payer: Medicare Other | Admitting: Physical Therapy

## 2019-05-24 DIAGNOSIS — M6281 Muscle weakness (generalized): Secondary | ICD-10-CM

## 2019-05-24 DIAGNOSIS — M542 Cervicalgia: Secondary | ICD-10-CM

## 2019-05-24 DIAGNOSIS — R293 Abnormal posture: Secondary | ICD-10-CM

## 2019-05-24 DIAGNOSIS — G4459 Other complicated headache syndrome: Secondary | ICD-10-CM | POA: Diagnosis not present

## 2019-05-24 MED ORDER — AJOVY 225 MG/1.5ML ~~LOC~~ SOSY: 225.0000 mg | PREFILLED_SYRINGE | SUBCUTANEOUS | 11 refills | Status: DC

## 2019-05-24 NOTE — Therapy (Addendum)
[facility] [address] [place], [place], [address] Phone: [phone]   Fax:  [phone]  Physical Therapy Treatment/Discharge Summary   Patient Details  Name: Taylor Adams [facility] MRN: [id number] Date of Birth: 01/03/1951 Referring Provider (PT): Dr. Sarina Ill    Encounter Date: 05/24/2019  PT End of Session - 05/24/19 1632    Visit Number  2    Date for PT Re-Evaluation  07/15/19    Authorization Type  Medicare    PT Start Time  0950    PT Stop Time  1030    PT Time Calculation (min)  40 min    Activity Tolerance  Patient limited by pain       Past Medical History:  Diagnosis Date  . Anxiety   . Arthritis   . Benign colon polyp   . Cataracts, bilateral   . Depression   . Diabetes mellitus   . Diarrhea   . Gastroparesis   . Headache   . Intestinal bacterial overgrowth   . PONV (postoperative nausea and vomiting)    history of n/v  . SBO (small bowel obstruction) (HCC)     Past Surgical History:  Procedure Laterality Date  . ABDOMINAL HYSTERECTOMY    . APPENDECTOMY    . BREAST EXCISIONAL BIOPSY Bilateral    benign  . BREAST SURGERY     biopsy  . CATARACT EXTRACTION Bilateral 2017   Dr. Talbert Forest  . CATARACT EXTRACTION, BILATERAL    . EYE SURGERY    . ILEOCECETOMY  10/07/2013   Procedure: Pennie Rushing;  Surgeon: Ralene Ok, MD;  Location: [place];  Service: General;;  . LAPAROTOMY N/A 10/07/2013   Procedure: EXPLORATORY LAPAROTOMY;  Surgeon: Ralene Ok, MD;  Location: [place];  Service: General;  Laterality: N/A;  . LYSIS OF ADHESION  10/07/2013   Procedure: LYSIS OF ADHESION;  Surgeon: Ralene Ok, MD;  Location: [place];  Service: General;;  . NASAL SEPTOPLASTY W/ TURBINOPLASTY Bilateral 12/28/2016   Procedure: NASAL SEPTOPLASTY WITH  BILATARAL TURBINATE REDUCTION;  Surgeon: Leta Baptist, MD;  Location: [facility];  Service: ENT;  Laterality: Bilateral;  . resection of lymph node in neck    . ROTATOR  CUFF REPAIR     right side  . SINUS ENDO WITH FUSION Bilateral 12/28/2016   Procedure: ENDOSCOPIC TOTAL ETHMOIDECTOMY,ENDOSCOPIC MAXILLARY ANTROSTOMY ;  Surgeon: Leta Baptist, MD;  Location: [facility] OR;  Service: ENT;  Laterality: Bilateral;  . YAG LASER APPLICATION Bilateral 9242    There were no vitals filed for this visit.  Subjective Assessment - 05/24/19 0952    Subjective  The doctor said to DN the masseter muscles but I'm bruised from the botox on Wednesday.  She did all over the head injections on Wednesday.  I have a bruise on my neck from the DN.  I was really sore for 2 days.  It really hurt a lot.  The patient reports her H/A is less intense but still rates her pain level the same as last visit.    Pertinent History  " I hate needles";  stopped exercising b/c of this (previously walked 3-4x/week and used hand weights);  Tupe 1 diabetes, cervical stenosis 5 years ago;  osteopenia;  right rotator cuff repair;  left frozen shoulder    Currently in Pain?  Yes    Pain Score  5     Pain Orientation  Right;Left    Pain Radiating Towards  frontal head;  bil upper traps;  bil interscapular region                       [facility] Adult PT Treatment/Exercise - 05/24/19 0001      Self-Care   Heat/Ice Application  use of heat, cold for pain relief     Other Self-Care Comments   use of home TENs, continued ROM, change of position       Manual Therapy   Joint Mobilization  Grade 1/2 PA oscillations C4-T1 2 bouts of 5-10 sec each level ; upper cervical flexion mobilization on/off 2 minutes    Soft tissue mobilization  Addaday instrument assisted to bil upper traps, levators and rhomboids in sitting     Myofascial Release  suboccipital release 2 min;  attempted superior head gentle circles but patient intolerant     Scapular Mobilization  grade 2 bil medial and lateral glides bil     Manual Traction  off/on 15 sec 8x     Muscle Energy Technique  contract-relax right and left upper trap 3x 5  sec       Kinesiotix   Facilitate Muscle   2 inverted Y strips from mid Cspine to along scapular spine and medial border of each scapula               PT Short Term Goals - 05/20/19 1319      PT SHORT TERM GOAL #1   Title  The patient will demonstrate basic self care principles include posture correction, ROM, use of home heat and husband's TENS    Time  4    Period  Weeks    Status  New    Target Date  06/17/19      PT SHORT TERM GOAL #2   Title  The patient wil lhave improved cervical flexion to 45, extension to 25, left sidebending to 20 degrees needed for driving    Time  4    Period  Weeks    Status  New      PT SHORT TERM GOAL #3   Title  The patient will report a 25% improvement in intensity and frequency of headache for improved sleep and leisure activities    Time  4    Period  Weeks    Status  New        PT Long Term Goals - 05/20/19 1322      PT LONG TERM GOAL #1   Title  The patient will be independent in safe self progression of HEP    Time  8    Period  Weeks    Status  New    Target Date  07/15/19      PT LONG TERM GOAL #2   Title  The patient will report a 50% improvement in intensity/frequency of headaches with daily activities    Time  8    Period  Weeks    Status  New      PT LONG TERM GOAL #3   Title  The patient will have improved bil cervical sidebending to 35 degrees and rotation to 35 degrees bil for driving    Time  8    Period  Weeks    Status  New      PT LONG TERM GOAL #4   Title  The patient will be able to return to leisure activities including working in her flowers, baking, walking with her husband and exercising with her weights    Time  8    Period  Weeks    Status  New      PT LONG TERM GOAL #5   Title  FOTO functional outcome score improved from 45% limitation to 39% indicating improved function with less pain    Time  8    Period  Weeks    Status  New            Plan - 05/24/19 1633    Clinical  Impression Statement  The patient has continued heightened state of pain sensitivity.  Given dry needling performed 4 days ago followed by from numerous botox injections 2 days ago, the patient and I agreed to hold off on dry needling today.  She is concerned about the bruising on right masseter region from botox injection and the M&M size bruising on left upper trap region from DN.  She is agreeable to manual and instrument assisted techniques to aimed at pain relief.  She is unable to tolerate interventions around the cranium but tolerates cervical and scapular interventions fairly well.  Good initial response to kinesiotaping.  Therapist closely monitoring response with all treatment interventions and modifying as needed.    Comorbidities  Type 1 diabetic;  osteopenia;  ENT issue with fluid; history of shoulder pathology;  diagnosed with cervical stenosis 5 years ago    Stability/Clinical Decision Making  Evolving/Moderate complexity    Rehab Potential  Good    PT Frequency  2x / week    PT Duration  8 weeks    PT Treatment/Interventions  ADLs/Self Care Home Management;Cryotherapy;Electrical Stimulation;Ultrasound;Traction;Moist Heat;Therapeutic activities;Therapeutic exercise;Neuromuscular re-education;Manual techniques;Patient/family education;Dry needling;Taping    PT Next Visit Plan  dry needling if patient receptive;    manual therapy including soft tissue and cervical distraction;  melt method;  try ES/heat;  initiate gentle cervical stretching       Patient will benefit from skilled therapeutic intervention in order to improve the following deficits and impairments:  Decreased range of motion, Increased fascial restricitons, Dizziness, Increased muscle spasms, Pain, Impaired UE functional use, Decreased activity tolerance, Impaired perceived functional ability, Decreased strength, Postural dysfunction  Visit Diagnosis: Cervicalgia  Other complicated headache syndrome  Abnormal  posture  Muscle weakness (generalized)  PHYSICAL THERAPY DISCHARGE SUMMARY  Visits from Start of Care: 2  Current functional level related to goals / functional outcomes: The patient called to cancel her remaining appointments and request discharge due to asthma flare-ups while wearing the mask for PT.     Remaining deficits: As above   Education / Equipment: Patient education  Plan: Patient agrees to discharge.  Patient goals were not met. Patient is being discharged due to the patient's request.  ?????       Problem List Patient Active Problem List   Diagnosis Date Noted  . Chronic migraine without aura, with intractable migraine, so stated, with status migrainosus 05/14/2019  . Cough variant asthma vs UACS 02/01/2018  . SBO (small bowel obstruction) ([place]) 10/06/2013  . DIABETES MELLITUS 05/29/2008  . GASTROPARESIS 05/29/2008  . COLONIC POLYPS, BENIGN, HX OF 05/29/2008   Ruben Im, PT 05/24/19 4:55 PM Phone: [phone] Fax: [phone] Alvera Singh 05/24/2019, 4:54 PM  [facility] [address], [place], [place], [address] Phone: [phone]   Fax:  [phone]  Name: Taylor Adams MRN: [id number] Date of Birth: April 24, 1951

## 2019-05-26 NOTE — Progress Notes (Signed)
Made any corrections needed, and agree with procedure  Taylor Ahern, MD [facility]  

## 2019-05-28 ENCOUNTER — Encounter: Payer: Self-pay | Admitting: Physical Therapy

## 2019-05-29 ENCOUNTER — Encounter: Payer: Self-pay | Admitting: *Deleted

## 2019-05-29 ENCOUNTER — Other Ambulatory Visit: Payer: Self-pay | Admitting: *Deleted

## 2019-05-29 ENCOUNTER — Telehealth: Payer: Self-pay | Admitting: *Deleted

## 2019-05-29 NOTE — Telephone Encounter (Signed)
No don't cancel it please and you don;'t have to email her I spoke with her thanks

## 2019-05-29 NOTE — Telephone Encounter (Signed)
She is doing great. We will hold off and discuss at next btox appointment

## 2019-05-29 NOTE — Telephone Encounter (Signed)
Noted. I went ahead and canceled the Ajovy in the pt's chart. I will send her a message to update her as well.

## 2019-05-29 NOTE — Telephone Encounter (Signed)
Spoke with Dr. Jaynee Eagles.

## 2019-05-29 NOTE — Telephone Encounter (Signed)
Received fax from [facility]. Ajovy not on formulary. Insurance prefers Set designer.

## 2019-05-29 NOTE — Telephone Encounter (Signed)
Do you think we can get the ajovy approved?

## 2019-05-30 ENCOUNTER — Encounter: Payer: Self-pay | Admitting: Physical Therapy

## 2019-06-04 ENCOUNTER — Encounter: Payer: Self-pay | Admitting: Physical Therapy

## 2019-06-06 ENCOUNTER — Encounter: Payer: Self-pay | Admitting: Physical Therapy

## 2019-06-11 ENCOUNTER — Encounter: Payer: Self-pay | Admitting: Physical Therapy

## 2019-06-13 ENCOUNTER — Encounter: Payer: Self-pay | Admitting: Physical Therapy

## 2019-06-17 DIAGNOSIS — I7300 Raynaud's syndrome without gangrene: Secondary | ICD-10-CM | POA: Diagnosis not present

## 2019-06-17 DIAGNOSIS — F419 Anxiety disorder, unspecified: Secondary | ICD-10-CM | POA: Diagnosis not present

## 2019-06-17 DIAGNOSIS — M8580 Other specified disorders of bone density and structure, unspecified site: Secondary | ICD-10-CM | POA: Diagnosis not present

## 2019-06-17 DIAGNOSIS — E1040 Type 1 diabetes mellitus with diabetic neuropathy, unspecified: Secondary | ICD-10-CM | POA: Diagnosis not present

## 2019-06-17 DIAGNOSIS — M542 Cervicalgia: Secondary | ICD-10-CM | POA: Diagnosis not present

## 2019-06-17 DIAGNOSIS — J45991 Cough variant asthma: Secondary | ICD-10-CM | POA: Diagnosis not present

## 2019-06-17 DIAGNOSIS — E103293 Type 1 diabetes mellitus with mild nonproliferative diabetic retinopathy without macular edema, bilateral: Secondary | ICD-10-CM | POA: Diagnosis not present

## 2019-06-17 DIAGNOSIS — G629 Polyneuropathy, unspecified: Secondary | ICD-10-CM | POA: Diagnosis not present

## 2019-06-17 DIAGNOSIS — Z4681 Encounter for fitting and adjustment of insulin pump: Secondary | ICD-10-CM | POA: Diagnosis not present

## 2019-06-17 DIAGNOSIS — I10 Essential (primary) hypertension: Secondary | ICD-10-CM | POA: Diagnosis not present

## 2019-06-18 ENCOUNTER — Encounter: Payer: Self-pay | Admitting: Physical Therapy

## 2019-06-20 ENCOUNTER — Encounter: Payer: Self-pay | Admitting: Physical Therapy

## 2019-06-20 DIAGNOSIS — M8580 Other specified disorders of bone density and structure, unspecified site: Secondary | ICD-10-CM | POA: Diagnosis not present

## 2019-06-20 DIAGNOSIS — E103293 Type 1 diabetes mellitus with mild nonproliferative diabetic retinopathy without macular edema, bilateral: Secondary | ICD-10-CM | POA: Diagnosis not present

## 2019-06-20 DIAGNOSIS — Z23 Encounter for immunization: Secondary | ICD-10-CM | POA: Diagnosis not present

## 2019-06-20 DIAGNOSIS — M859 Disorder of bone density and structure, unspecified: Secondary | ICD-10-CM | POA: Diagnosis not present

## 2019-07-11 DIAGNOSIS — L233 Allergic contact dermatitis due to drugs in contact with skin: Secondary | ICD-10-CM | POA: Diagnosis not present

## 2019-07-11 DIAGNOSIS — L0109 Other impetigo: Secondary | ICD-10-CM | POA: Diagnosis not present

## 2019-08-14 ENCOUNTER — Encounter (INDEPENDENT_AMBULATORY_CARE_PROVIDER_SITE_OTHER): Payer: Medicare Other | Admitting: Ophthalmology

## 2019-08-22 ENCOUNTER — Ambulatory Visit (INDEPENDENT_AMBULATORY_CARE_PROVIDER_SITE_OTHER): Payer: Medicare Other | Admitting: Neurology

## 2019-08-22 ENCOUNTER — Other Ambulatory Visit: Payer: Self-pay

## 2019-08-22 VITALS — Temp 97.6°F

## 2019-08-22 DIAGNOSIS — G43711 Chronic migraine without aura, intractable, with status migrainosus: Secondary | ICD-10-CM | POA: Diagnosis not present

## 2019-08-22 MED ORDER — NURTEC 75 MG PO TBDP: 75.0000 mg | ORAL_TABLET | Freq: Every day | ORAL | 6 refills | Status: DC | PRN

## 2019-08-22 MED ORDER — OXYCODONE-ACETAMINOPHEN 10-325 MG PO TABS: 1.0000 | ORAL_TABLET | Freq: Four times a day (QID) | ORAL | 0 refills | Status: DC | PRN

## 2019-08-22 NOTE — Progress Notes (Signed)
Consent Form Botulism Toxin Injection For Chronic Migraine  She had dry needling but it hurt a lot. Today is our second botox. EXCEPTIONAL improvement, she is now making cookies with her grandchild.  We will also give 12 samples of Ajovy. Gave her percocet, explained risks, I can refill every 3-4 months if needed (may provide up to 20 pills each time) even though I do not recommend using for migraines we have tried multiple other options. Will also see if we can get Nurtec approved. +a  Consent Form Botulism Toxin Injection For Chronic Migraine    Reviewed orally with patient, additionally signature is on file:  Botulism toxin has been approved by the Federal drug administration for treatment of chronic migraine. Botulism toxin does not cure chronic migraine and it may not be effective in some patients.  The administration of botulism toxin is accomplished by injecting a small amount of toxin into the muscles of the neck and head. Dosage must be titrated for each individual. Any benefits resulting from botulism toxin tend to wear off after 3 months with a repeat injection required if benefit is to be maintained. Injections are usually done every 3-4 months with maximum effect peak achieved by about 2 or 3 weeks. Botulism toxin is expensive and you should be sure of what costs you will incur resulting from the injection.  The side effects of botulism toxin use for chronic migraine may include:   -Transient, and usually mild, facial weakness with facial injections  -Transient, and usually mild, head or neck weakness with head/neck injections  -Reduction or loss of forehead facial animation due to forehead muscle weakness  -Eyelid drooping  -Dry eye  -Pain at the site of injection or bruising at the site of injection  -Double vision  -Potential unknown long term risks  Contraindications: You should not have Botox if you are pregnant, nursing, allergic to albumin, have an infection, skin  condition, or muscle weakness at the site of the injection, or have myasthenia gravis, Lambert-Eaton syndrome, or ALS.  It is also possible that as with any injection, there may be an allergic reaction or no effect from the medication. Reduced effectiveness after repeated injections is sometimes seen and rarely infection at the injection site may occur. All care will be taken to prevent these side effects. If therapy is given over a long time, atrophy and wasting in the muscle injected may occur. Occasionally the patient's become refractory to treatment because they develop antibodies to the toxin. In this event, therapy needs to be modified.  I have read the above information and consent to the administration of botulism toxin.    BOTOX PROCEDURE NOTE FOR MIGRAINE HEADACHE    Contraindications and precautions discussed with patient(above). Aseptic procedure was observed and patient tolerated procedure. Procedure performed by Dr. Artemio Aly  The condition has existed for more than 6 months, and pt does not have a diagnosis of ALS, Myasthenia Gravis or Lambert-Eaton Syndrome.  Risks and benefits of injections discussed and pt agrees to proceed with the procedure.  Written consent obtained  These injections are medically necessary. Pt  receives good benefits from these injections. These injections do not cause sedations or hallucinations which the oral therapies may cause.  Description of procedure:  The patient was placed in a sitting position. The standard protocol was used for Botox as follows, with 5 units of Botox injected at each site:   -Procerus muscle, midline injection  -Corrugator muscle, bilateral injection  -Frontalis muscle,  bilateral injection, with 2 sites each side, medial injection was performed in the upper one third of the frontalis muscle, in the region vertical from the medial inferior edge of the superior orbital rim. The lateral injection was again in the upper one  third of the forehead vertically above the lateral limbus of the cornea, 1.5 cm lateral to the medial injection site.  -Temporalis muscle injection, 4 sites, bilaterally. The first injection was 3 cm above the tragus of the ear, second injection site was 1.5 cm to 3 cm up from the first injection site in line with the tragus of the ear. The third injection site was 1.5-3 cm forward between the first 2 injection sites. The fourth injection site was 1.5 cm posterior to the second injection site.   -Occipitalis muscle injection, 3 sites, bilaterally. The first injection was done one half way between the occipital protuberance and the tip of the mastoid process behind the ear. The second injection site was done lateral and superior to the first, 1 fingerbreadth from the first injection. The third injection site was 1 fingerbreadth superiorly and medially from the first injection site.  -Cervical paraspinal muscle injection, 2 sites, bilateral knee first injection site was 1 cm from the midline of the cervical spine, 3 cm inferior to the lower border of the occipital protuberance. The second injection site was 1.5 cm superiorly and laterally to the first injection site.  -Trapezius muscle injection was performed at 3 sites, bilaterally. The first injection site was in the upper trapezius muscle halfway between the inflection point of the neck, and the acromion. The second injection site was one half way between the acromion and the first injection site. The third injection was done between the first injection site and the inflection point of the neck.   Will return for repeat injection in 3 months.   200 units of Botox was used, any Botox not injected was wasted. The patient tolerated the procedure well, there were no complications of the above procedure.

## 2019-08-22 NOTE — Progress Notes (Signed)
Botox- 200 units x 1 vial Lot: [id number] Expiration: 05/2022 NDC: 0023-3921-02  Bacteriostatic 0.9% Sodium Chloride- 4mL total Lot: [id number] Expiration: 01/02/2020 NDC: 0409-1966-02  Dx: G43.711 B/B   

## 2019-08-27 ENCOUNTER — Ambulatory Visit: Payer: Medicare Other | Admitting: Neurology

## 2019-09-03 DIAGNOSIS — Z13220 Encounter for screening for lipoid disorders: Secondary | ICD-10-CM | POA: Diagnosis not present

## 2019-09-03 DIAGNOSIS — M859 Disorder of bone density and structure, unspecified: Secondary | ICD-10-CM | POA: Diagnosis not present

## 2019-09-03 DIAGNOSIS — I10 Essential (primary) hypertension: Secondary | ICD-10-CM | POA: Diagnosis not present

## 2019-09-03 DIAGNOSIS — E103293 Type 1 diabetes mellitus with mild nonproliferative diabetic retinopathy without macular edema, bilateral: Secondary | ICD-10-CM | POA: Diagnosis not present

## 2019-09-05 DIAGNOSIS — R82998 Other abnormal findings in urine: Secondary | ICD-10-CM | POA: Diagnosis not present

## 2019-09-11 ENCOUNTER — Telehealth: Payer: Self-pay

## 2019-09-11 NOTE — Telephone Encounter (Signed)
Pt was called and made aware of change. Pt stated she will be here at that time.

## 2019-09-11 NOTE — Telephone Encounter (Signed)
Patient has been rs and moved to Amy's schedule per Dr. Jaynee Eagles request. Please call and make patient aware of new apt date and time. DW

## 2019-09-17 DIAGNOSIS — Z1389 Encounter for screening for other disorder: Secondary | ICD-10-CM | POA: Diagnosis not present

## 2019-09-17 DIAGNOSIS — Z6820 Body mass index (BMI) 20.0-20.9, adult: Secondary | ICD-10-CM | POA: Diagnosis not present

## 2019-09-17 DIAGNOSIS — Z130 Encounter for screening for diseases of the blood and blood-forming organs and certain disorders involving the immune mechanism: Secondary | ICD-10-CM | POA: Diagnosis not present

## 2019-09-17 DIAGNOSIS — N951 Menopausal and female climacteric states: Secondary | ICD-10-CM | POA: Diagnosis not present

## 2019-09-17 DIAGNOSIS — N9089 Other specified noninflammatory disorders of vulva and perineum: Secondary | ICD-10-CM | POA: Diagnosis not present

## 2019-09-17 DIAGNOSIS — N952 Postmenopausal atrophic vaginitis: Secondary | ICD-10-CM | POA: Diagnosis not present

## 2019-09-23 ENCOUNTER — Other Ambulatory Visit: Payer: Self-pay | Admitting: Obstetrics and Gynecology

## 2019-09-23 DIAGNOSIS — R928 Other abnormal and inconclusive findings on diagnostic imaging of breast: Secondary | ICD-10-CM

## 2019-10-04 DIAGNOSIS — M810 Age-related osteoporosis without current pathological fracture: Secondary | ICD-10-CM

## 2019-10-04 HISTORY — DX: Age-related osteoporosis without current pathological fracture: M81.0

## 2019-10-07 ENCOUNTER — Other Ambulatory Visit: Payer: Self-pay

## 2019-10-07 ENCOUNTER — Ambulatory Visit: Payer: Medicare Other

## 2019-10-07 ENCOUNTER — Ambulatory Visit
Admission: RE | Admit: 2019-10-07 | Discharge: 2019-10-07 | Disposition: A | Discharge status: Home / Self Care | Payer: Medicare Other | Source: Ambulatory Visit | Attending: Obstetrics and Gynecology | Admitting: Obstetrics and Gynecology

## 2019-10-07 DIAGNOSIS — R928 Other abnormal and inconclusive findings on diagnostic imaging of breast: Secondary | ICD-10-CM

## 2019-11-26 ENCOUNTER — Encounter: Payer: Self-pay | Admitting: Family Medicine

## 2019-11-26 ENCOUNTER — Ambulatory Visit (INDEPENDENT_AMBULATORY_CARE_PROVIDER_SITE_OTHER): Payer: Medicare Other | Admitting: Family Medicine

## 2019-11-26 ENCOUNTER — Other Ambulatory Visit: Payer: Self-pay

## 2019-11-26 DIAGNOSIS — G43711 Chronic migraine without aura, intractable, with status migrainosus: Secondary | ICD-10-CM

## 2019-11-26 NOTE — Progress Notes (Addendum)
She is doing well, overall. She feels that Botox has helped significantly with migraine intensity, frequency and neck pain. She is using oxycodone-acetaminophen 10-325mg  as needed for migraines (approx 5-6 times a month). Was unable to afford Nurtec.   Consent Form Botulism Toxin Injection For Chronic Migraine    Reviewed orally with patient, additionally signature is on file:  Botulism toxin has been approved by the Federal drug administration for treatment of chronic migraine. Botulism toxin does not cure chronic migraine and it may not be effective in some patients.  The administration of botulism toxin is accomplished by injecting a small amount of toxin into the muscles of the neck and head. Dosage must be titrated for each individual. Any benefits resulting from botulism toxin tend to wear off after 3 months with a repeat injection required if benefit is to be maintained. Injections are usually done every 3-4 months with maximum effect peak achieved by about 2 or 3 weeks. Botulism toxin is expensive and you should be sure of what costs you will incur resulting from the injection.  The side effects of botulism toxin use for chronic migraine may include:   -Transient, and usually mild, facial weakness with facial injections  -Transient, and usually mild, head or neck weakness with head/neck injections  -Reduction or loss of forehead facial animation due to forehead muscle weakness  -Eyelid drooping  -Dry eye  -Pain at the site of injection or bruising at the site of injection  -Double vision  -Potential unknown long term risks   Contraindications: You should not have Botox if you are pregnant, nursing, allergic to albumin, have an infection, skin condition, or muscle weakness at the site of the injection, or have myasthenia gravis, Lambert-Eaton syndrome, or ALS.  It is also possible that as with any injection, there may be an allergic reaction or no effect from the medication.  Reduced effectiveness after repeated injections is sometimes seen and rarely infection at the injection site may occur. All care will be taken to prevent these side effects. If therapy is given over a long time, atrophy and wasting in the muscle injected may occur. Occasionally the patient's become refractory to treatment because they develop antibodies to the toxin. In this event, therapy needs to be modified.  I have read the above information and consent to the administration of botulism toxin.    BOTOX PROCEDURE NOTE FOR MIGRAINE HEADACHE  Contraindications and precautions discussed with patient(above). Aseptic procedure was observed and patient tolerated procedure. Procedure performed by Shawnie Dapper, FNP-C.   The condition has existed for more than 6 months, and pt does not have a diagnosis of ALS, Myasthenia Gravis or Lambert-Eaton Syndrome.  Risks and benefits of injections discussed and pt agrees to proceed with the procedure.  Written consent obtained  These injections are medically necessary. Pt  receives good benefits from these injections. These injections do not cause sedations or hallucinations which the oral therapies may cause.   Description of procedure:  The patient was placed in a sitting position. The standard protocol was used for Botox as follows, with 5 units of Botox injected at each site:  -Procerus muscle, midline injection  -Corrugator muscle, bilateral injection  -Frontalis muscle, bilateral injection, with 2 sites each side, medial injection was performed in the upper one third of the frontalis muscle, in the region vertical from the medial inferior edge of the superior orbital rim. The lateral injection was again in the upper one third of the forehead vertically above  the lateral limbus of the cornea, 1.5 cm lateral to the medial injection site.  -Temporalis muscle injection, 4 sites, bilaterally. The first injection was 3 cm above the tragus of the ear, second  injection site was 1.5 cm to 3 cm up from the first injection site in line with the tragus of the ear. The third injection site was 1.5-3 cm forward between the first 2 injection sites. The fourth injection site was 1.5 cm posterior to the second injection site. 5th site laterally in the temporalis  muscleat the level of the outer canthus.  -Occipitalis muscle injection, 3 sites, bilaterally. The first injection was done one half way between the occipital protuberance and the tip of the mastoid process behind the ear. The second injection site was done lateral and superior to the first, 1 fingerbreadth from the first injection. The third injection site was 1 fingerbreadth superiorly and medially from the first injection site.  -Cervical paraspinal muscle injection, 2 sites, bilateral knee first injection site was 1 cm from the midline of the cervical spine, 3 cm inferior to the lower border of the occipital protuberance. The second injection site was 1.5 cm superiorly and laterally to the first injection site.  -Trapezius muscle injection was performed at 3 sites, bilaterally. The first injection site was in the upper trapezius muscle halfway between the inflection point of the neck, and the acromion. The second injection site was one half way between the acromion and the first injection site. The third injection was done between the first injection site and the inflection point of the neck.  -Masseter muscle bilaterally 5 units each, patient reports clenching that contributes to migraines.    Will return for repeat injection in 3 months.   A total of 200 units of Botox was prepared, 165 units of Botox was injected as documented above, any Botox not injected was wasted. The patient tolerated the procedure well, there were no complications of the above procedure.  Made any corrections needed, and agree with procedure.   Sarina Ill, MD [facility]

## 2019-11-26 NOTE — Progress Notes (Signed)
Botox- 200 units x 4 vials Lot: [id number] Expiration: 07/2022 NDC: 5681-2751-70  Bacteriostatic 0.9% Sodium Chloride- 64mL total Lot: [id number] Expiration: 01/02/2020 NDC: 4967-5916-38  Dx: G66.599 B/B

## 2019-11-27 ENCOUNTER — Telehealth: Payer: Self-pay | Admitting: Family Medicine

## 2019-11-27 NOTE — Telephone Encounter (Signed)
Patient called to schedule her botox apt please FU

## 2019-12-02 ENCOUNTER — Encounter: Payer: Self-pay | Admitting: Family Medicine

## 2019-12-03 ENCOUNTER — Ambulatory Visit: Payer: Medicare Other | Admitting: Neurology

## 2019-12-04 NOTE — Telephone Encounter (Signed)
Patient was scheduled by [facility]. DWD

## 2019-12-09 ENCOUNTER — Ambulatory Visit: Payer: Medicare Other | Admitting: Family Medicine

## 2019-12-12 DIAGNOSIS — M8589 Other specified disorders of bone density and structure, multiple sites: Secondary | ICD-10-CM | POA: Diagnosis not present

## 2019-12-12 DIAGNOSIS — E1040 Type 1 diabetes mellitus with diabetic neuropathy, unspecified: Secondary | ICD-10-CM | POA: Diagnosis not present

## 2019-12-13 DIAGNOSIS — E1043 Type 1 diabetes mellitus with diabetic autonomic (poly)neuropathy: Secondary | ICD-10-CM | POA: Diagnosis not present

## 2019-12-13 DIAGNOSIS — M5030 Other cervical disc degeneration, unspecified cervical region: Secondary | ICD-10-CM | POA: Diagnosis not present

## 2019-12-13 DIAGNOSIS — M810 Age-related osteoporosis without current pathological fracture: Secondary | ICD-10-CM | POA: Diagnosis not present

## 2019-12-13 DIAGNOSIS — G629 Polyneuropathy, unspecified: Secondary | ICD-10-CM | POA: Diagnosis not present

## 2019-12-13 DIAGNOSIS — E1040 Type 1 diabetes mellitus with diabetic neuropathy, unspecified: Secondary | ICD-10-CM | POA: Diagnosis not present

## 2019-12-13 DIAGNOSIS — I10 Essential (primary) hypertension: Secondary | ICD-10-CM | POA: Diagnosis not present

## 2019-12-13 DIAGNOSIS — G43711 Chronic migraine without aura, intractable, with status migrainosus: Secondary | ICD-10-CM | POA: Diagnosis not present

## 2019-12-13 DIAGNOSIS — Z1331 Encounter for screening for depression: Secondary | ICD-10-CM | POA: Diagnosis not present

## 2019-12-13 DIAGNOSIS — E103293 Type 1 diabetes mellitus with mild nonproliferative diabetic retinopathy without macular edema, bilateral: Secondary | ICD-10-CM | POA: Diagnosis not present

## 2020-02-17 DIAGNOSIS — E1040 Type 1 diabetes mellitus with diabetic neuropathy, unspecified: Secondary | ICD-10-CM | POA: Diagnosis not present

## 2020-02-17 DIAGNOSIS — J019 Acute sinusitis, unspecified: Secondary | ICD-10-CM | POA: Diagnosis not present

## 2020-02-17 DIAGNOSIS — J45991 Cough variant asthma: Secondary | ICD-10-CM | POA: Diagnosis not present

## 2020-02-17 DIAGNOSIS — J029 Acute pharyngitis, unspecified: Secondary | ICD-10-CM | POA: Diagnosis not present

## 2020-02-24 ENCOUNTER — Ambulatory Visit (INDEPENDENT_AMBULATORY_CARE_PROVIDER_SITE_OTHER): Payer: Medicare Other | Admitting: Family Medicine

## 2020-02-24 ENCOUNTER — Other Ambulatory Visit: Payer: Self-pay

## 2020-02-24 DIAGNOSIS — R0681 Apnea, not elsewhere classified: Secondary | ICD-10-CM

## 2020-02-24 DIAGNOSIS — G43711 Chronic migraine without aura, intractable, with status migrainosus: Secondary | ICD-10-CM

## 2020-02-24 MED ORDER — NURTEC 75 MG PO TBDP: 75.0000 mg | ORAL_TABLET | Freq: Every day | ORAL | 6 refills | Status: DC | PRN

## 2020-02-24 NOTE — Progress Notes (Addendum)
Taylor Adams continues to note benefit of Botox therapy. She continues Ajovy every 30 days. She is using Percocet for abortive therapy as nothing else has helped. She does not like the way Percocet makes her feel. She has found a program that may help with the cost of Nurtec and would like a prescription. She does report frequent headaches that are present in the mornings or wake her from her sleep. She does snore. She was told by her husband that she was making "scary noises" when she sleeps. She does endorse dry mouth, frequent waking and excessive daytime sleepiness. She has never been tested for sleep apnea.   Consent Form Botulism Toxin Injection For Chronic Migraine   Reviewed orally with patient, additionally signature is on file:  Botulism toxin has been approved by the Federal drug administration for treatment of chronic migraine. Botulism toxin does not cure chronic migraine and it may not be effective in some patients.  The administration of botulism toxin is accomplished by injecting a small amount of toxin into the muscles of the neck and head. Dosage must be titrated for each individual. Any benefits resulting from botulism toxin tend to wear off after 3 months with a repeat injection required if benefit is to be maintained. Injections are usually done every 3-4 months with maximum effect peak achieved by about 2 or 3 weeks. Botulism toxin is expensive and you should be sure of what costs you will incur resulting from the injection.  The side effects of botulism toxin use for chronic migraine may include:   -Transient, and usually mild, facial weakness with facial injections  -Transient, and usually mild, head or neck weakness with head/neck injections  -Reduction or loss of forehead facial animation due to forehead muscle weakness  -Eyelid drooping  -Dry eye  -Pain at the site of injection or bruising at the site of injection  -Double vision  -Potential unknown long term  risks   Contraindications: You should not have Botox if you are pregnant, nursing, allergic to albumin, have an infection, skin condition, or muscle weakness at the site of the injection, or have myasthenia gravis, Lambert-Eaton syndrome, or ALS.  It is also possible that as with any injection, there may be an allergic reaction or no effect from the medication. Reduced effectiveness after repeated injections is sometimes seen and rarely infection at the injection site may occur. All care will be taken to prevent these side effects. If therapy is given over a long time, atrophy and wasting in the muscle injected may occur. Occasionally the patient's become refractory to treatment because they develop antibodies to the toxin. In this event, therapy needs to be modified.  I have read the above information and consent to the administration of botulism toxin.    BOTOX PROCEDURE NOTE FOR MIGRAINE HEADACHE  Contraindications and precautions discussed with patient(above). Aseptic procedure was observed and patient tolerated procedure. Procedure performed by Shawnie Dapper, FNP-C.   The condition has existed for more than 6 months, and pt does not have a diagnosis of ALS, Myasthenia Gravis or Lambert-Eaton Syndrome.  Risks and benefits of injections discussed and pt agrees to proceed with the procedure.  Written consent obtained  These injections are medically necessary. Pt  receives good benefits from these injections. These injections do not cause sedations or hallucinations which the oral therapies may cause.   Description of procedure:  The patient was placed in a sitting position. The standard protocol was used for Botox as follows, with 5  units of Botox injected at each site:  -Procerus muscle, midline injection  -Corrugator muscle, bilateral injection  -Frontalis muscle, bilateral injection, with 2 sites each side, medial injection was performed in the upper one third of the frontalis muscle, in  the region vertical from the medial inferior edge of the superior orbital rim. The lateral injection was again in the upper one third of the forehead vertically above the lateral limbus of the cornea, 1.5 cm lateral to the medial injection site.  -Temporalis muscle injection, 4 sites, bilaterally. The first injection was 3 cm above the tragus of the ear, second injection site was 1.5 cm to 3 cm up from the first injection site in line with the tragus of the ear. The third injection site was 1.5-3 cm forward between the first 2 injection sites. The fourth injection site was 1.5 cm posterior to the second injection site. 5th site laterally in the temporalis  muscleat the level of the outer canthus.  -Occipitalis muscle injection, 3 sites, bilaterally. The first injection was done one half way between the occipital protuberance and the tip of the mastoid process behind the ear. The second injection site was done lateral and superior to the first, 1 fingerbreadth from the first injection. The third injection site was 1 fingerbreadth superiorly and medially from the first injection site.  -Cervical paraspinal muscle injection, 2 sites, bilaterally. The first injection site was 1 cm from the midline of the cervical spine, 3 cm inferior to the lower border of the occipital protuberance. The second injection site was 1.5 cm superiorly and laterally to the first injection site.  -Trapezius muscle injection was performed at 3 sites, bilaterally. The first injection site was in the upper trapezius muscle halfway between the inflection point of the neck, and the acromion. The second injection site was one half way between the acromion and the first injection site. The third injection was done between the first injection site and the inflection point of the neck.   Will return for repeat injection in 3 months.   A total of 200 units of Botox was prepared, 155 units of Botox was injected as documented above, any  Botox not injected was wasted. The patient tolerated the procedure well, there were no complications of the above procedure.  Made any corrections needed, and agree with procedure   Taylor Ill, MD [facility]

## 2020-02-24 NOTE — Progress Notes (Signed)
Botox-100 unit sx 2 vials Lot: [id number] Expiration: 07/2022 NDC: 3729-0211-15   0.9% Sodium Chloride- 26mL total Lot: [id number] Expiration: 07/2022 NDC: 33612-244-97  Dx: Migraines B/B   Consent signed

## 2020-02-25 ENCOUNTER — Encounter: Payer: Self-pay | Admitting: Family Medicine

## 2020-02-26 ENCOUNTER — Encounter: Payer: Self-pay | Admitting: Family Medicine

## 2020-03-05 ENCOUNTER — Telehealth: Payer: Self-pay | Admitting: *Deleted

## 2020-03-05 NOTE — Telephone Encounter (Addendum)
Completed Nurtec PA on Cover My Meds. Key: [id number]. Awaiting determination from [facility].  I called [employer] to discuss the PA as I spoke with pt and she said she had only ended up trying one triptan. The frova ended up being too expensive. Naratriptan made her feel weird.  I spoke with [employer] rep and let them know I had selected yes for pt trying two triptans when it turns out that the patient only tried one due to cost. I spoke with Warden Fillers (reference # for call [id number]) direct line to department, [phone] opt 2). She documented the information and advised that since the medication was already approved they could not cancel the request. The patient may fill Nurtec at the pharmacy. It is approved until further notice. I called the pt and LVM (ok per DPR) letting her know. Pharmacy also notified via fax. Received a receipt of confirmation.

## 2020-03-05 NOTE — Telephone Encounter (Signed)
Wonderful!!! TY.

## 2020-03-11 ENCOUNTER — Institutional Professional Consult (permissible substitution): Payer: Medicare Other | Admitting: Neurology

## 2020-03-12 DIAGNOSIS — E103293 Type 1 diabetes mellitus with mild nonproliferative diabetic retinopathy without macular edema, bilateral: Secondary | ICD-10-CM | POA: Diagnosis not present

## 2020-03-12 DIAGNOSIS — F419 Anxiety disorder, unspecified: Secondary | ICD-10-CM | POA: Diagnosis not present

## 2020-03-12 DIAGNOSIS — J45991 Cough variant asthma: Secondary | ICD-10-CM | POA: Diagnosis not present

## 2020-03-12 DIAGNOSIS — Z4681 Encounter for fitting and adjustment of insulin pump: Secondary | ICD-10-CM | POA: Diagnosis not present

## 2020-03-12 DIAGNOSIS — G43711 Chronic migraine without aura, intractable, with status migrainosus: Secondary | ICD-10-CM | POA: Diagnosis not present

## 2020-03-12 DIAGNOSIS — E1040 Type 1 diabetes mellitus with diabetic neuropathy, unspecified: Secondary | ICD-10-CM | POA: Diagnosis not present

## 2020-03-12 DIAGNOSIS — G629 Polyneuropathy, unspecified: Secondary | ICD-10-CM | POA: Diagnosis not present

## 2020-03-12 DIAGNOSIS — E1043 Type 1 diabetes mellitus with diabetic autonomic (poly)neuropathy: Secondary | ICD-10-CM | POA: Diagnosis not present

## 2020-03-12 DIAGNOSIS — I10 Essential (primary) hypertension: Secondary | ICD-10-CM | POA: Diagnosis not present

## 2020-03-19 ENCOUNTER — Institutional Professional Consult (permissible substitution): Payer: Medicare Other | Admitting: Neurology

## 2020-05-06 DIAGNOSIS — M25511 Pain in right shoulder: Secondary | ICD-10-CM | POA: Diagnosis not present

## 2020-05-06 DIAGNOSIS — M7541 Impingement syndrome of right shoulder: Secondary | ICD-10-CM | POA: Diagnosis not present

## 2020-05-12 DIAGNOSIS — Z4681 Encounter for fitting and adjustment of insulin pump: Secondary | ICD-10-CM | POA: Diagnosis not present

## 2020-05-12 DIAGNOSIS — Z794 Long term (current) use of insulin: Secondary | ICD-10-CM | POA: Diagnosis not present

## 2020-05-12 DIAGNOSIS — E1043 Type 1 diabetes mellitus with diabetic autonomic (poly)neuropathy: Secondary | ICD-10-CM | POA: Diagnosis not present

## 2020-05-12 DIAGNOSIS — I10 Essential (primary) hypertension: Secondary | ICD-10-CM | POA: Diagnosis not present

## 2020-05-12 DIAGNOSIS — E1040 Type 1 diabetes mellitus with diabetic neuropathy, unspecified: Secondary | ICD-10-CM | POA: Diagnosis not present

## 2020-05-25 DIAGNOSIS — E1043 Type 1 diabetes mellitus with diabetic autonomic (poly)neuropathy: Secondary | ICD-10-CM | POA: Diagnosis not present

## 2020-05-25 DIAGNOSIS — Z794 Long term (current) use of insulin: Secondary | ICD-10-CM | POA: Diagnosis not present

## 2020-05-25 DIAGNOSIS — Z4681 Encounter for fitting and adjustment of insulin pump: Secondary | ICD-10-CM | POA: Diagnosis not present

## 2020-05-26 ENCOUNTER — Ambulatory Visit (INDEPENDENT_AMBULATORY_CARE_PROVIDER_SITE_OTHER): Payer: Medicare Other | Admitting: Family Medicine

## 2020-05-26 DIAGNOSIS — G43711 Chronic migraine without aura, intractable, with status migrainosus: Secondary | ICD-10-CM | POA: Diagnosis not present

## 2020-05-26 NOTE — Progress Notes (Signed)
Botox- 100 units x 2 vials Lot: [id number] Expiration: 07/2022 NDC: 2297-9892-11  Bacteriostatic 0.9% Sodium Chloride- 71mL total [id number] Expiration: 11/2021 NDC: 8185-6314-97  Dx: W26.378 B/B  Consent was given and signed today.

## 2020-05-26 NOTE — Progress Notes (Signed)
She is doing well. She was able to get Nurtec but had to pay over 600 dollars for 8 tablets. She feels that they are miracle tablets. She has used 1 over the past 3 months. She reports headaches return about a week prior to botox treatment.   Consent Form Botulism Toxin Injection For Chronic Migraine    Reviewed orally with patient, additionally signature is on file:  Botulism toxin has been approved by the Federal drug administration for treatment of chronic migraine. Botulism toxin does not cure chronic migraine and it may not be effective in some patients.  The administration of botulism toxin is accomplished by injecting a small amount of toxin into the muscles of the neck and head. Dosage must be titrated for each individual. Any benefits resulting from botulism toxin tend to wear off after 3 months with a repeat injection required if benefit is to be maintained. Injections are usually done every 3-4 months with maximum effect peak achieved by about 2 or 3 weeks. Botulism toxin is expensive and you should be sure of what costs you will incur resulting from the injection.  The side effects of botulism toxin use for chronic migraine may include:   -Transient, and usually mild, facial weakness with facial injections  -Transient, and usually mild, head or neck weakness with head/neck injections  -Reduction or loss of forehead facial animation due to forehead muscle weakness  -Eyelid drooping  -Dry eye  -Pain at the site of injection or bruising at the site of injection  -Double vision  -Potential unknown long term risks   Contraindications: You should not have Botox if you are pregnant, nursing, allergic to albumin, have an infection, skin condition, or muscle weakness at the site of the injection, or have myasthenia gravis, Lambert-Eaton syndrome, or ALS.  It is also possible that as with any injection, there may be an allergic reaction or no effect from the medication. Reduced  effectiveness after repeated injections is sometimes seen and rarely infection at the injection site may occur. All care will be taken to prevent these side effects. If therapy is given over a long time, atrophy and wasting in the muscle injected may occur. Occasionally the patient's become refractory to treatment because they develop antibodies to the toxin. In this event, therapy needs to be modified.  I have read the above information and consent to the administration of botulism toxin.    BOTOX PROCEDURE NOTE FOR MIGRAINE HEADACHE  Contraindications and precautions discussed with patient(above). Aseptic procedure was observed and patient tolerated procedure. Procedure performed by Shawnie Dapper, FNP-C.   The condition has existed for more than 6 months, and pt does not have a diagnosis of ALS, Myasthenia Gravis or Lambert-Eaton Syndrome.  Risks and benefits of injections discussed and pt agrees to proceed with the procedure.  Written consent obtained  These injections are medically necessary. Pt  receives good benefits from these injections. These injections do not cause sedations or hallucinations which the oral therapies may cause.   Description of procedure:  The patient was placed in a sitting position. The standard protocol was used for Botox as follows, with 5 units of Botox injected at each site:  -Procerus muscle, midline injection  -Corrugator muscle, bilateral injection  -Frontalis muscle, bilateral injection, with 2 sites each side, medial injection was performed in the upper one third of the frontalis muscle, in the region vertical from the medial inferior edge of the superior orbital rim. The lateral injection was again in  the upper one third of the forehead vertically above the lateral limbus of the cornea, 1.5 cm lateral to the medial injection site.  -Temporalis muscle injection, 4 sites, bilaterally. The first injection was 3 cm above the tragus of the ear, second injection  site was 1.5 cm to 3 cm up from the first injection site in line with the tragus of the ear. The third injection site was 1.5-3 cm forward between the first 2 injection sites. The fourth injection site was 1.5 cm posterior to the second injection site. 5th site laterally in the temporalis  muscleat the level of the outer canthus.  -Occipitalis muscle injection, 3 sites, bilaterally. The first injection was done one half way between the occipital protuberance and the tip of the mastoid process behind the ear. The second injection site was done lateral and superior to the first, 1 fingerbreadth from the first injection. The third injection site was 1 fingerbreadth superiorly and medially from the first injection site.  -Cervical paraspinal muscle injection, 2 sites, bilaterally. The first injection site was 1 cm from the midline of the cervical spine, 3 cm inferior to the lower border of the occipital protuberance. The second injection site was 1.5 cm superiorly and laterally to the first injection site.  -Trapezius muscle injection was performed at 3 sites, bilaterally. The first injection site was in the upper trapezius muscle halfway between the inflection point of the neck, and the acromion. The second injection site was one half way between the acromion and the first injection site. The third injection was done between the first injection site and the inflection point of the neck.   Will return for repeat injection in 3 months.   A total of 200 units of Botox was prepared, 155 units of Botox was injected as documented above, any Botox not injected was wasted. The patient tolerated the procedure well, there were no complications of the above procedure.

## 2020-05-28 NOTE — Progress Notes (Signed)
[facility] - Clinic Note  06/02/2020     CHIEF COMPLAINT Patient presents for Retina Follow Up   HISTORY OF PRESENT ILLNESS: Taylor Adams is a 69 y.o. female who presents to the clinic today for:   HPI    Retina Follow Up    Patient presents with  Diabetic Retinopathy.  In both eyes.  Duration of 1 year.  I, the attending physician,  performed the HPI with the patient and updated documentation appropriately.          Comments    1 year follow up NPDR OU- Eyes are tearing more than before OU.  Vision isn't as clear OS, mild.  She only wears reading glasses.   Denies using eye drops. DM x42 yrs BS: 126 today A1C: 6.2 or 6.3       Last edited by Rennis Chris, MD on 06/02/2020  9:17 AM. (History)    pt states her left eye is a "little cloudy", she says it feels like there is "baby oil" in it, she is not using any drops  Referring physician: Gaspar Garbe, MD [address] [place],  [place] [address]  HISTORICAL INFORMATION:   Selected notes from the MEDICAL RECORD NUMBER Referred by self for DM exam LEE:  Ocular Hx- PMH-   CURRENT MEDICATIONS: No current outpatient medications on file. (Ophthalmic Drugs)   No current facility-administered medications for this visit. (Ophthalmic Drugs)   Current Outpatient Medications (Other)  Medication Sig   ALPRAZolam (XANAX) 0.5 MG tablet Take 1 tablet (0.5 mg total) by mouth 2 (two) times daily as needed for anxiety.   B-D ULTRA-FINE 33 LANCETS MISC    BAYER CONTOUR NEXT TEST test strip    Biotin 5000 MCG TABS Take 5,000 mcg by mouth daily.    budesonide-formoterol (SYMBICORT) 80-4.5 MCG/ACT inhaler Inhale 2 puffs into the lungs 2 (two) times daily.   Calcium Carbonate-Vitamin D (CALTRATE 600+D PO) Take 1 tablet by mouth daily.   Cholecalciferol (VITAMIN D3) 1000 units CAPS Take 1,000 Units by mouth 2 (two) times daily.    Chromium Picolinate 800 MCG TABS Take 800 mcg by mouth daily.    cyclobenzaprine (FLEXERIL) 10 MG tablet Take 10 mg by mouth as needed.   GLUCAGEN HYPOKIT 1 MG SOLR injection USE AS NEEDED FOR SEVERE HYPOGLYCEMIA   hydrOXYzine (ATARAX/VISTARIL) 25 MG tablet Take 25 mg by mouth 3 (three) times daily as needed.   hyoscyamine (LEVSIN SL) 0.125 MG SL tablet Place 0.125 mg under the tongue every 4 (four) hours as needed for cramping.    Insulin Human (INSULIN PUMP) 100 unit/ml SOLN Inject into the skin continuous. Novolog 100 unit/mL Basal Rate: 12 am to 4:30 am  0.375 4:30 am to 8 am   0.45 8 am to 12 pm    0.525 12 pm to 3 pm  0.45 3 pm to 9 pm 0.475 9 pm to 12 am 0.425   losartan (COZAAR) 25 MG tablet Take 50 mg by mouth daily.   magnesium oxide (MAG-OX) 400 MG tablet Take 400 mg by mouth at bedtime.   Multiple Vitamin (MULTIVITAMIN) tablet Take 1 tablet by mouth daily. Women's 50+   oxyCODONE-acetaminophen (PERCOCET) 10-325 MG tablet Take prn up to bid for severe pain.   oxyCODONE-acetaminophen (PERCOCET) 10-325 MG tablet Take 1 tablet by mouth every 6 (six) hours as needed for pain.   Probiotic Product (VSL#3) CAPS Take 1 capsule by mouth daily.   Rimegepant Sulfate (NURTEC)  75 MG TBDP Take 75 mg by mouth daily as needed. For migraines. Take as close to onset of migraine as possible. One daily maximum.   TRAMADOL HCL PO Take by mouth as needed.   No current facility-administered medications for this visit. (Other)      REVIEW OF SYSTEMS: ROS    Positive for: Gastrointestinal, Endocrine, Eyes, Psychiatric   Negative for: Constitutional, Neurological, Skin, Genitourinary, Musculoskeletal, HENT, Cardiovascular, Respiratory, Allergic/Imm, Heme/Lymph   Last edited by Joni Reining, COA on 06/02/2020  9:05 AM. (History)       ALLERGIES Allergies  Allergen Reactions   Codeine Itching    Pt denies this is an allergy   Latex Rash    PAST MEDICAL HISTORY Past Medical History:  Diagnosis Date   Anxiety    Arthritis    Benign  colon polyp    Cataracts, bilateral    Depression    Diabetes mellitus    Diarrhea    Gastroparesis    Headache    Intestinal bacterial overgrowth    PONV (postoperative nausea and vomiting)    history of n/v   SBO (small bowel obstruction) (HCC)    Past Surgical History:  Procedure Laterality Date   ABDOMINAL HYSTERECTOMY     APPENDECTOMY     BREAST EXCISIONAL BIOPSY Bilateral    benign   BREAST SURGERY     biopsy   CATARACT EXTRACTION Bilateral 2017   Dr. Vonna Kotyk   CATARACT EXTRACTION, BILATERAL     EYE SURGERY     ILEOCECETOMY  10/07/2013   Procedure: Ferne Coe;  Surgeon: Axel Filler, MD;  Location: [facility] OR;  Service: General;;   LAPAROTOMY N/A 10/07/2013   Procedure: EXPLORATORY LAPAROTOMY;  Surgeon: Axel Filler, MD;  Location: [facility] OR;  Service: General;  Laterality: N/A;   LYSIS OF ADHESION  10/07/2013   Procedure: LYSIS OF ADHESION;  Surgeon: Axel Filler, MD;  Location: [facility] OR;  Service: General;;   NASAL SEPTOPLASTY W/ TURBINOPLASTY Bilateral 12/28/2016   Procedure: NASAL SEPTOPLASTY WITH  BILATARAL TURBINATE REDUCTION;  Surgeon: Newman Pies, MD;  Location: [facility] OR;  Service: ENT;  Laterality: Bilateral;   resection of lymph node in neck     ROTATOR CUFF REPAIR     right side   SINUS ENDO WITH FUSION Bilateral 12/28/2016   Procedure: ENDOSCOPIC TOTAL ETHMOIDECTOMY,ENDOSCOPIC MAXILLARY ANTROSTOMY ;  Surgeon: Newman Pies, MD;  Location: [facility] OR;  Service: ENT;  Laterality: Bilateral;   YAG LASER APPLICATION Bilateral 2018    FAMILY HISTORY Family History  Problem Relation Age of Onset   Colon polyps Father    Heart disease Father    Diabetes Father    Diabetes Mother    Heart disease Mother    Heart disease Brother    Allergies Brother    Allergies Sister    Colon cancer Neg Hx    Rectal cancer Neg Hx    Stomach cancer Neg Hx    Esophageal cancer Neg Hx     SOCIAL HISTORY Social History   Tobacco Use   Smoking status: Never  Smoker   Smokeless tobacco: Never Used  Vaping Use   Vaping Use: Never used  Substance Use Topics   Alcohol use: Not Currently    Alcohol/week: 0.0 standard drinks   Drug use: No         OPHTHALMIC EXAM:  Base Eye Exam    Visual Acuity (Snellen - Linear)      Right Left   Dist [place] 20/20 -2  20/20       Tonometry (Tonopen, 9:12 AM)      Right Left   Pressure 23 21       Pupils      Dark Light Shape React APD   Right 4 3 Round Brisk None   Left 4 3 Round Brisk None       Visual Fields (Counting fingers)      Left Right    Full Full       Extraocular Movement      Right Left    Full Full       Neuro/Psych    Oriented x3: Yes   Mood/Affect: Normal       Dilation    Both eyes: 1.0% Mydriacyl, 2.5% Phenylephrine @ 9:12 AM        Slit Lamp and Fundus Exam    Slit Lamp Exam      Right Left   Lids/Lashes Dermatochalasis - upper lid Dermatochalasis - upper lid, mild Meibomian gland dysfunction   Conjunctiva/Sclera White and quiet White and quiet   Cornea Well healed temporal cataract wounds, 1+Punctate epithelial erosions, arcus Well healed temporal cataract wounds, 1+Punctate epithelial erosions   Anterior Chamber Deep and quiet Deep and quiet   Iris Round and dilated, No NVI Round and dilated, No NVI   Lens PC IOL in good position PC IOL in good position with open PC   Vitreous Vitreous syneresis, vitreous condensations Vitreous syneresis, Posterior vitreous detachment, vitreous condensations       Fundus Exam      Right Left   Disc Compact, mild temporal Peripapillary atrophy, Pink and Sharp Compact, mild tilt, temporal PPA, Pink and Sharp   C/D Ratio 0.1 0.3   Macula Flat, Blunted foveal reflex, mild Retinal pigment epithelial mottling, No heme or edema Flat, blunted foveal reflex, mild Retinal pigment epithelial mottling, No heme or edema   Vessels Mild Vascular attenuation, mild Tortuousity Mild Vascular attenuation, mild Tortuousity, mild AV  crossing changes   Periphery Attached, mild 360 MA Attached, scattered MA 360        Refraction    Manifest Refraction      Sphere Cylinder Axis Dist VA   Right [place] +0.50 025 20/20   Left [place] +0.75 005 20/20          IMAGING AND PROCEDURES  Imaging and Procedures for @TODAY @  OCT, Retina - OU - Both Eyes       Right Eye Quality was good. Central Foveal Thickness: 294. Progression has been stable. Findings include normal foveal contour, no IRF, no SRF.   Left Eye Quality was good. Central Foveal Thickness: 297. Progression has been stable. Findings include normal foveal contour, no IRF, no SRF.   Notes *Images captured and stored on drive  Diagnosis / Impression:  NFP, no IRF/SRF OU No DME OU  Clinical management:  See below  Abbreviations: NFP - Normal foveal profile. CME - cystoid macular edema. PED - pigment epithelial detachment. IRF - intraretinal fluid. SRF - subretinal fluid. EZ - ellipsoid zone. ERM - epiretinal membrane. ORA - outer retinal atrophy. ORT - outer retinal tubulation. SRHM - subretinal hyper-reflective material                 ASSESSMENT/PLAN:    ICD-10-CM   1. Mild nonproliferative diabetic retinopathy of both eyes without macular edema associated with type 1 diabetes mellitus (HCC)    2. Retinal edema  H35.81 OCT, Retina - OU - Both  Eyes  3. Essential hypertension  I10   4. Hypertensive retinopathy of both eyes  H35.033   5. Pseudophakia of both eyes  Z96.1   6. History of myopia  Z86.69   7. Intractable chronic migraine without aura and with status migrainosus  G43.711   8. Visual disturbances  H53.9     1,2. Diabetes mellitus, type 1 with mild nonproliferative diabetic retinopathy OU -- stable  - exam shows mild peripheral MA OU, no DME OU  - The incidence, risk factors for progression, natural history and treatment options for diabetic retinopathy  were discussed with patient.    - The need for close monitoring of  blood glucose, blood pressure, and serum lipids, avoiding cigarette or any type of tobacco, and the need for long term follow up was also discussed with patient.  - f/u 1 year, sooner prn  3,4. Hypertensive retinopathy OU  - discussed importance of tight BP control  - monitor  5. Pseudophakia OU  - s/p CE/IOL OU ([place]evis)  - s/p YAG cap OS (Bevis)  - beautiful surgeries, doing well  - monitor  6. History of Myopia  - high myopia prior to cataract surgery  - myopic contour noted on OCT  7,8. Migraine with visual disturbances OS  - pt with history of intractable migraine w/ complaint of "fog/film" in left visual field  - following closely with Neurology  - nothing on dilated eye exam to correlate visual disturbance and on our objective testing is seeing 20/20 and no significant ocular findings  - recommend continued management with Neurology    Ophthalmic Meds Ordered this visit:  No orders of the defined types were placed in this encounter.      Return in about 1 year (around 06/02/2021) for f/u DM exam, DFE, OCT.  There are no Patient Instructions on file for this visit.   Explained the diagnoses, plan, and follow up with the patient and they expressed understanding.  Patient expressed understanding of the importance of proper follow up care.   This document serves as a record of services personally performed by Karie Chimera, MD, PhD. It was created on their behalf by Cristopher Estimable, COT an ophthalmic technician. The creation of this record is the provider's dictation and/or activities during the visit.    Electronically signed by: Cristopher Estimable, COT 8.26.21 @ 9:44 PM   This document serves as a record of services personally performed by Karie Chimera, MD, PhD. It was created on their behalf by Glee Arvin. Manson Passey, OA an ophthalmic technician. The creation of this record is the provider's dictation and/or activities during the visit.    Electronically signed by: Glee Arvin.  Manson Passey, [place] 08.31.2021 9:44 PM  Karie Chimera, M.D., Ph.D. Diseases & Surgery of the Retina and Vitreous [facility] 06/02/2020   I have reviewed the above documentation for accuracy and completeness, and I agree with the above. Karie Chimera, M.D., Ph.D. 06/02/20 9:44 PM   Abbreviations: M myopia (nearsighted); A astigmatism; H hyperopia (farsighted); P presbyopia; Mrx spectacle prescription;  CTL contact lenses; OD right eye; OS left eye; OU both eyes  XT exotropia; ET esotropia; PEK punctate epithelial keratitis; PEE punctate epithelial erosions; DES dry eye syndrome; MGD meibomian gland dysfunction; ATs artificial tears; PFAT's preservative free artificial tears; NSC nuclear sclerotic cataract; PSC posterior subcapsular cataract; ERM epi-retinal membrane; PVD posterior vitreous detachment; RD retinal detachment; DM diabetes mellitus; DR diabetic retinopathy; NPDR non-proliferative diabetic retinopathy; PDR  proliferative diabetic retinopathy; CSME clinically significant macular edema; DME diabetic macular edema; dbh dot blot hemorrhages; CWS cotton wool spot; POAG primary open angle glaucoma; C/D cup-to-disc ratio; HVF humphrey visual field; GVF goldmann visual field; OCT optical coherence tomography; IOP intraocular pressure; BRVO Branch retinal vein occlusion; CRVO central retinal vein occlusion; CRAO central retinal artery occlusion; BRAO branch retinal artery occlusion; RT retinal tear; SB scleral buckle; PPV pars plana vitrectomy; VH Vitreous hemorrhage; PRP panretinal laser photocoagulation; IVK intravitreal kenalog; VMT vitreomacular traction; MH Macular hole;  NVD neovascularization of the disc; NVE neovascularization elsewhere; AREDS age related eye disease study; ARMD age related macular degeneration; POAG primary open angle glaucoma; EBMD epithelial/anterior basement membrane dystrophy; ACIOL anterior chamber intraocular lens; IOL intraocular lens; PCIOL posterior  chamber intraocular lens; Phaco/IOL phacoemulsification with intraocular lens placement; PRK photorefractive keratectomy; LASIK laser assisted in situ keratomileusis; HTN hypertension; DM diabetes mellitus; COPD chronic obstructive pulmonary disease

## 2020-06-02 ENCOUNTER — Other Ambulatory Visit: Payer: Self-pay

## 2020-06-02 ENCOUNTER — Ambulatory Visit (INDEPENDENT_AMBULATORY_CARE_PROVIDER_SITE_OTHER): Payer: Medicare Other | Admitting: Ophthalmology

## 2020-06-02 ENCOUNTER — Encounter (INDEPENDENT_AMBULATORY_CARE_PROVIDER_SITE_OTHER): Payer: Self-pay | Admitting: Ophthalmology

## 2020-06-02 DIAGNOSIS — G43711 Chronic migraine without aura, intractable, with status migrainosus: Secondary | ICD-10-CM

## 2020-06-02 DIAGNOSIS — H3581 Retinal edema: Secondary | ICD-10-CM

## 2020-06-02 DIAGNOSIS — Z961 Presence of intraocular lens: Secondary | ICD-10-CM

## 2020-06-02 DIAGNOSIS — E103293 Type 1 diabetes mellitus with mild nonproliferative diabetic retinopathy without macular edema, bilateral: Secondary | ICD-10-CM | POA: Diagnosis not present

## 2020-06-02 DIAGNOSIS — H35033 Hypertensive retinopathy, bilateral: Secondary | ICD-10-CM

## 2020-06-02 DIAGNOSIS — Z8669 Personal history of other diseases of the nervous system and sense organs: Secondary | ICD-10-CM | POA: Diagnosis not present

## 2020-06-02 DIAGNOSIS — I10 Essential (primary) hypertension: Secondary | ICD-10-CM | POA: Diagnosis not present

## 2020-06-02 DIAGNOSIS — H539 Unspecified visual disturbance: Secondary | ICD-10-CM | POA: Diagnosis not present

## 2020-06-12 DIAGNOSIS — Z794 Long term (current) use of insulin: Secondary | ICD-10-CM | POA: Diagnosis not present

## 2020-06-12 DIAGNOSIS — Z4681 Encounter for fitting and adjustment of insulin pump: Secondary | ICD-10-CM | POA: Diagnosis not present

## 2020-06-12 DIAGNOSIS — I10 Essential (primary) hypertension: Secondary | ICD-10-CM | POA: Diagnosis not present

## 2020-06-12 DIAGNOSIS — E1043 Type 1 diabetes mellitus with diabetic autonomic (poly)neuropathy: Secondary | ICD-10-CM | POA: Diagnosis not present

## 2020-06-24 DIAGNOSIS — F419 Anxiety disorder, unspecified: Secondary | ICD-10-CM | POA: Diagnosis not present

## 2020-06-24 DIAGNOSIS — G43711 Chronic migraine without aura, intractable, with status migrainosus: Secondary | ICD-10-CM | POA: Diagnosis not present

## 2020-06-24 DIAGNOSIS — E103293 Type 1 diabetes mellitus with mild nonproliferative diabetic retinopathy without macular edema, bilateral: Secondary | ICD-10-CM | POA: Diagnosis not present

## 2020-06-24 DIAGNOSIS — Z23 Encounter for immunization: Secondary | ICD-10-CM | POA: Diagnosis not present

## 2020-06-24 DIAGNOSIS — Z794 Long term (current) use of insulin: Secondary | ICD-10-CM | POA: Diagnosis not present

## 2020-06-24 DIAGNOSIS — J45991 Cough variant asthma: Secondary | ICD-10-CM | POA: Diagnosis not present

## 2020-06-24 DIAGNOSIS — I10 Essential (primary) hypertension: Secondary | ICD-10-CM | POA: Diagnosis not present

## 2020-06-24 DIAGNOSIS — Z4681 Encounter for fitting and adjustment of insulin pump: Secondary | ICD-10-CM | POA: Diagnosis not present

## 2020-06-24 DIAGNOSIS — E1043 Type 1 diabetes mellitus with diabetic autonomic (poly)neuropathy: Secondary | ICD-10-CM | POA: Diagnosis not present

## 2020-07-21 DIAGNOSIS — K118 Other diseases of salivary glands: Secondary | ICD-10-CM | POA: Diagnosis not present

## 2020-07-21 DIAGNOSIS — E109 Type 1 diabetes mellitus without complications: Secondary | ICD-10-CM | POA: Diagnosis not present

## 2020-07-21 DIAGNOSIS — Z885 Allergy status to narcotic agent status: Secondary | ICD-10-CM | POA: Diagnosis not present

## 2020-07-21 DIAGNOSIS — K1120 Sialoadenitis, unspecified: Secondary | ICD-10-CM | POA: Diagnosis not present

## 2020-07-21 DIAGNOSIS — Z91040 Latex allergy status: Secondary | ICD-10-CM | POA: Diagnosis not present

## 2020-07-21 DIAGNOSIS — G43709 Chronic migraine without aura, not intractable, without status migrainosus: Secondary | ICD-10-CM | POA: Diagnosis not present

## 2020-07-21 DIAGNOSIS — R600 Localized edema: Secondary | ICD-10-CM | POA: Diagnosis not present

## 2020-07-21 DIAGNOSIS — I7300 Raynaud's syndrome without gangrene: Secondary | ICD-10-CM | POA: Diagnosis not present

## 2020-07-29 DIAGNOSIS — Z23 Encounter for immunization: Secondary | ICD-10-CM | POA: Diagnosis not present

## 2020-08-15 IMAGING — CT CT MAXILLOFACIAL WITHOUT CONTRAST
3 of 5 series · 15 of 47 positions shown, 18 images · non-contrast
Comparison: 10/17/2016 CT maxillofacial.

CLINICAL DATA: 67 y/o F; fusion protocol, sinusitis, drainage with
pain and pressure. History of sinus surgery in 7424.

EXAM:
CT MAXILLOFACIAL WITHOUT CONTRAST
TECHNIQUE: Multidetector CT images of the paranasal sinuses were obtained using
the standard protocol without intravenous contrast.

[Series 4: sinus 2.00 hr60 s3 cor · coronal · 0.30mm/px · 3 of 85 slices shown]
[im 29/85  bone]
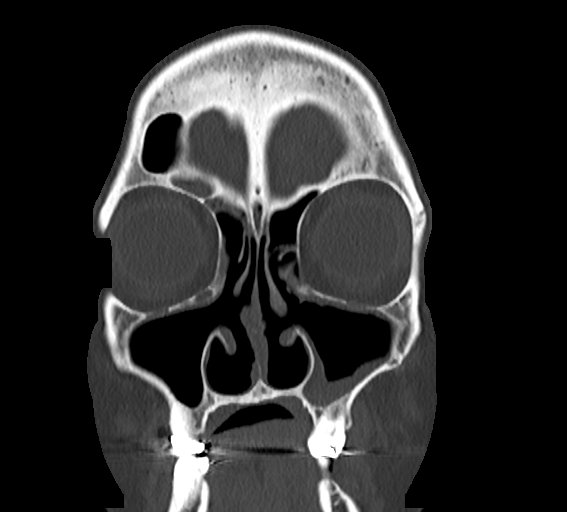
[im 38/85  bone]
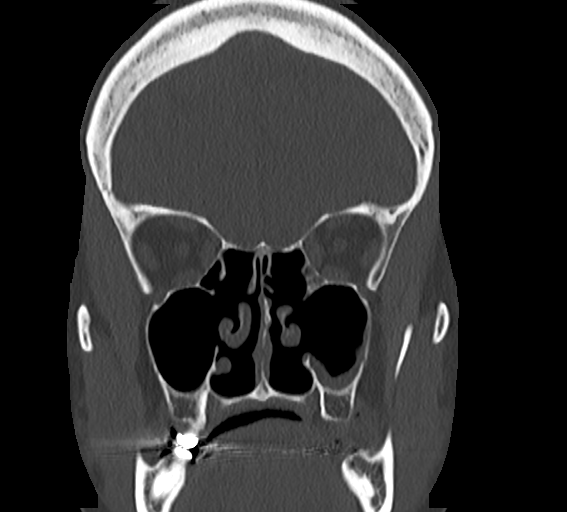
[im 47/85  bone]
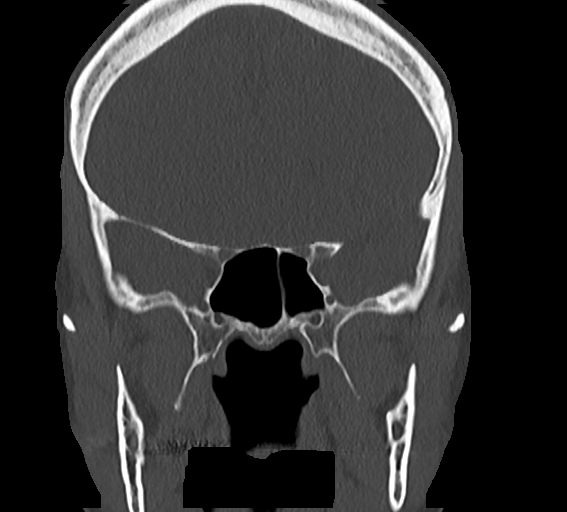

[Series 6: sinus 2.00 hr60 s3 sag · sagittal · 0.30mm/px · 3 of 85 slices shown]
[im 29/85  bone]
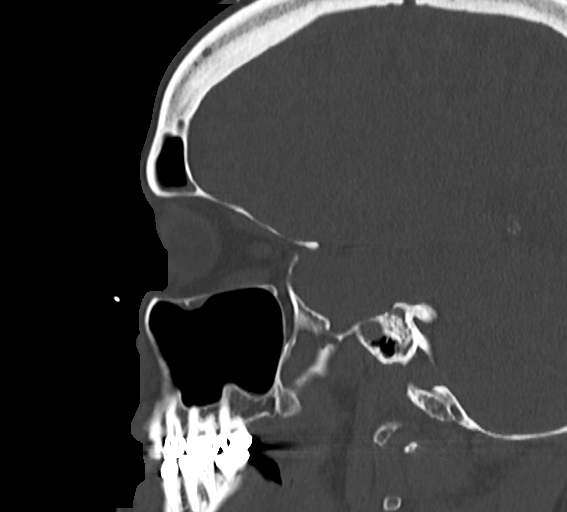
[im 43/85  bone]
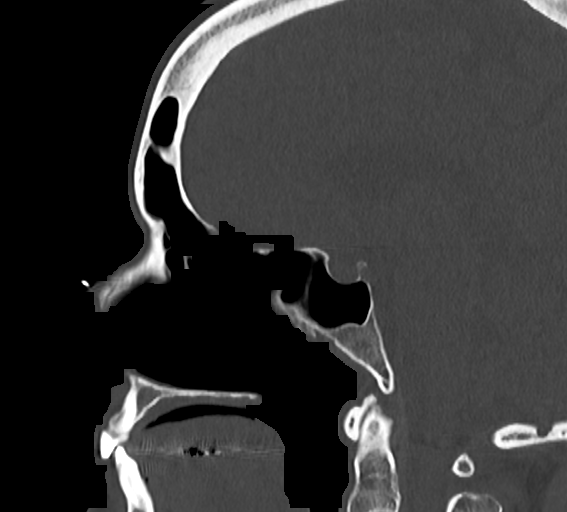
[im 57/85  bone]
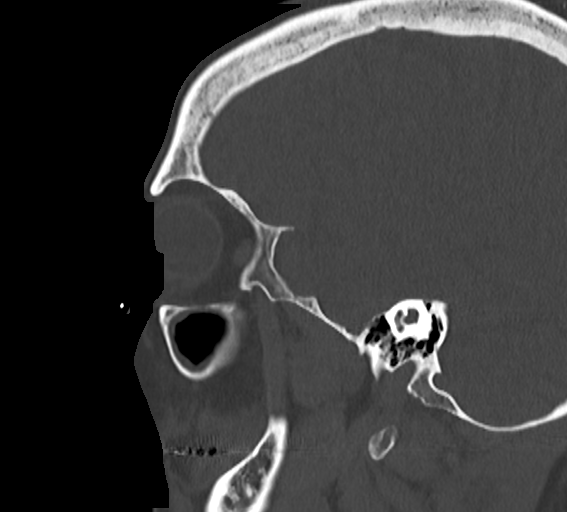

[Series 12: sinus 1.00 hr60 s3 axial fusion thins · axial · 0.33mm/px · z∈[-639,-503]mm · 9 of 257 slices shown, 12 images]
[im 15/257  brain]
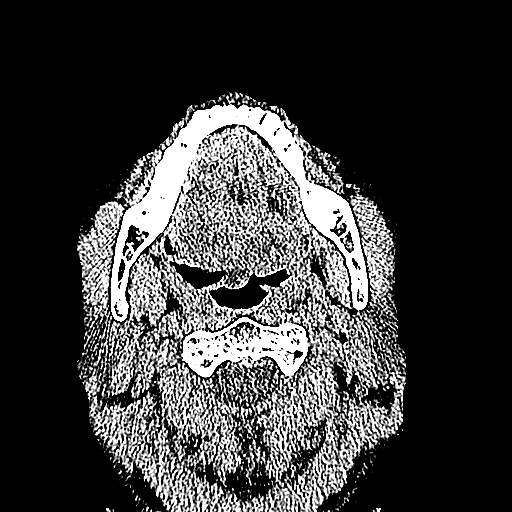
[im 15/257  bone]
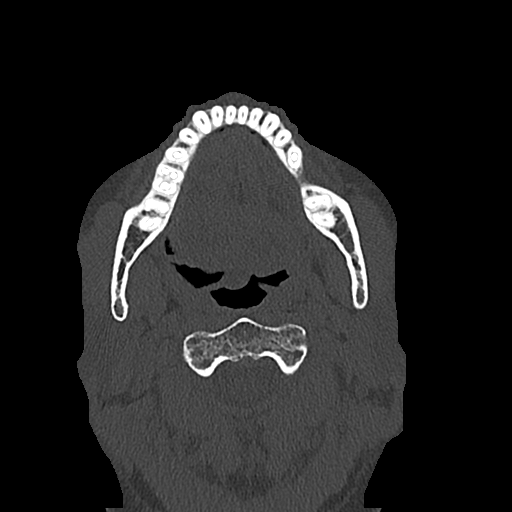
[im 43/257  bone]
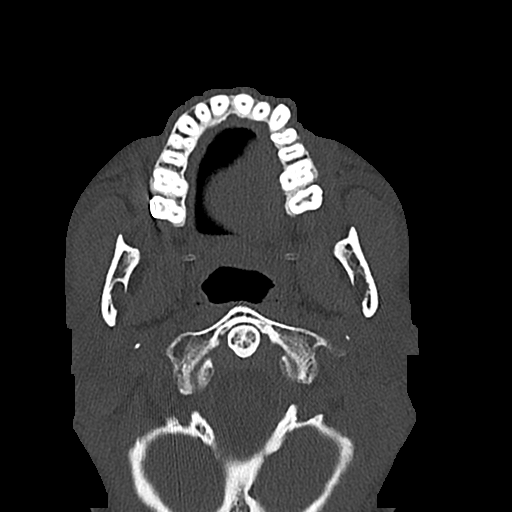
[im 72/257  bone]
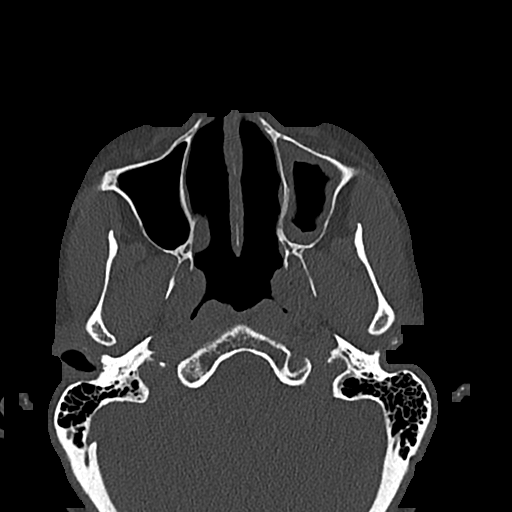
[im 100/257  bone]
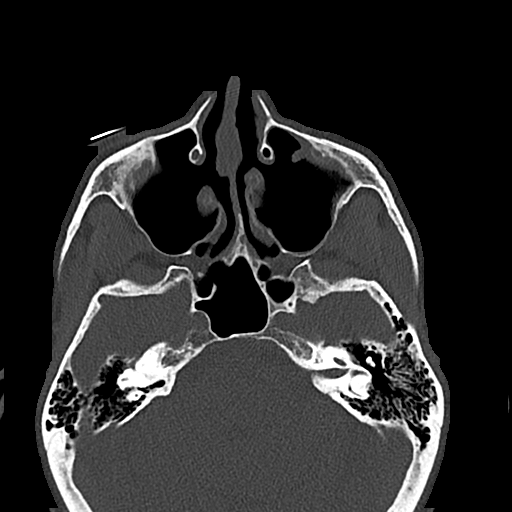
[im 129/257  brain]
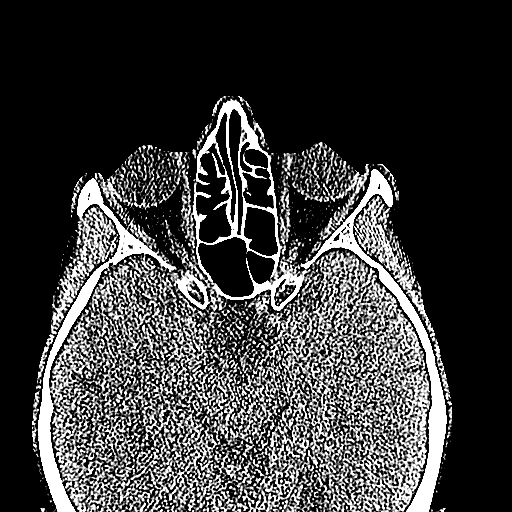
[im 129/257  bone]
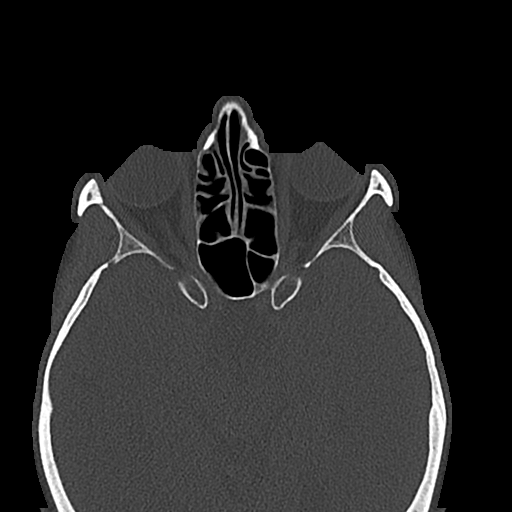
[im 157/257  bone]
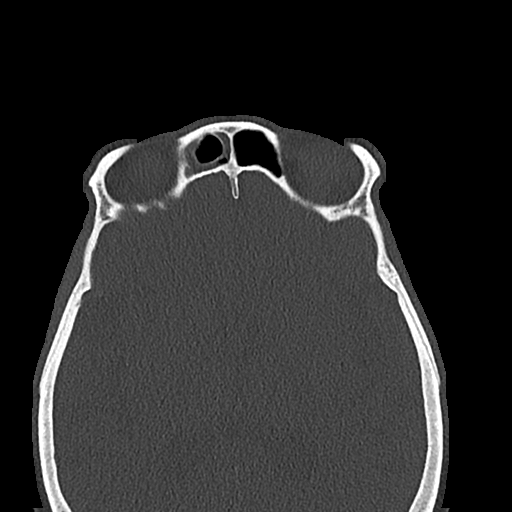
[im 185/257  bone]
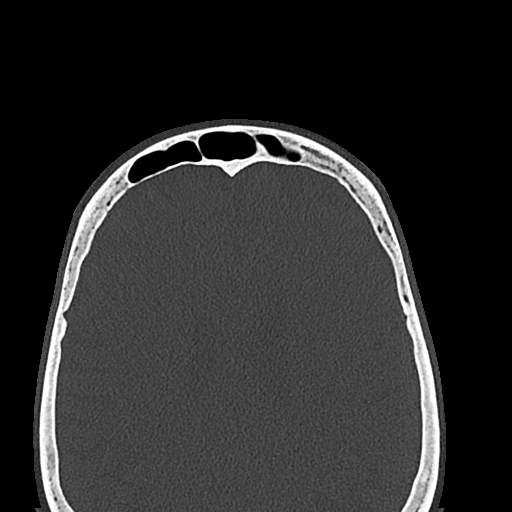
[im 214/257  bone]
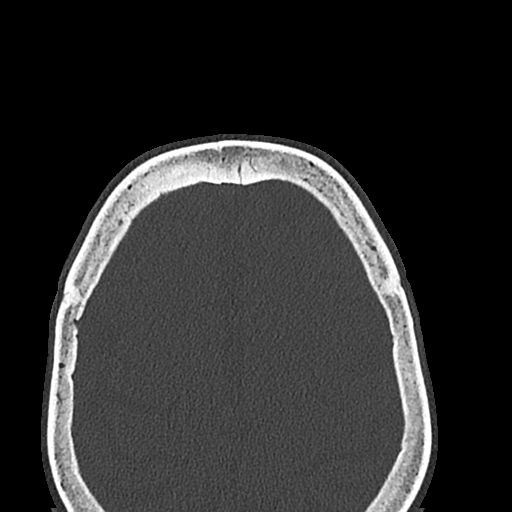
[im 242/257  brain]
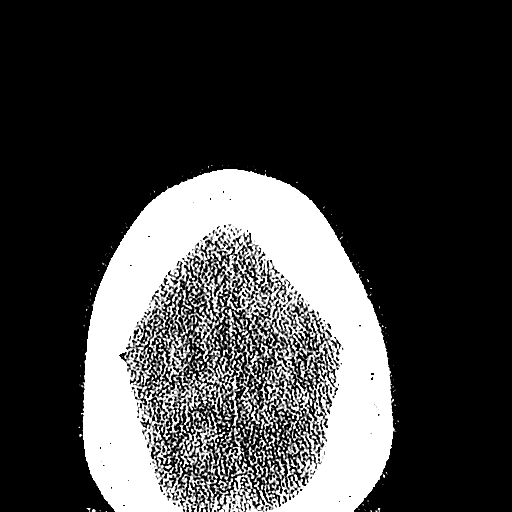
[im 242/257  bone]
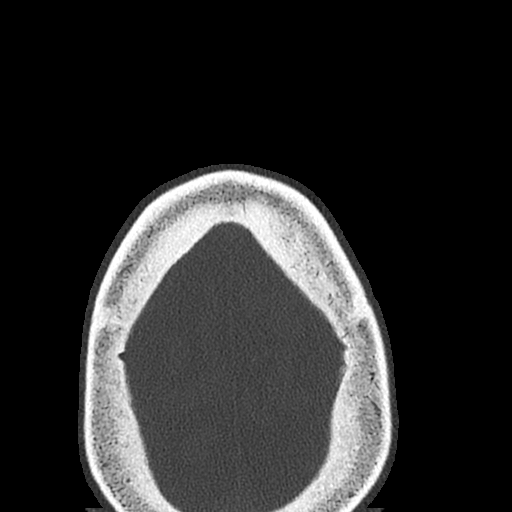

[15 of 47 positions shown; findings below may reference images not displayed]

FINDINGS: Paranasal sinuses:

Frontal: Mild mucosal thickening of the right frontal sinus antrum.
Normal aeration of left frontal sinus. Patent frontal sinus drainage
pathways.

Ethmoid: Ethmoidectomy postsurgical changes. Mild mucosal thickening
of the residual ethmoid air cells and walls of ethmoidectomy
cavities.

Maxillary: Moderate left-sided maxillary sinus mucosal thickening
greatest in the alveolar recess. Mild mucosal thickening of the
right maxillary sinus roof.

Sphenoid: Normally aerated. Patent sphenoethmoidal recesses.

Right ostiomeatal unit: Widely patent maxillary antrostomy.

Left ostiomeatal unit: Widely patent maxillary antrostomy.

Nasal passages: Patent. Intact nasal septum. Minimal rightward nasal
septal deviation.

Anatomy: No pneumatization superior to anterior ethmoid notches.
Symmetric and intact olfactory grooves and fovea ethmoidalis, Keros
II (4-7mm). Sellar sphenoid pneumatization pattern. No dehiscence of
carotid or optic canals. No onodi cell.

Other: Orbits and intracranial compartment are unremarkable. Visible
mastoid air cells are normally aerated.
IMPRESSION: 1. Bilateral maxillary antrostomy and ethmoidectomy.
2. Mild mucosal thickening of right frontal sinus antrum,
ethmoidectomy cavities, and right maxillary sinus. Moderate mucosal
thickening of left maxillary sinus. No sinus fluid levels.
3. Patent sinus drainage pathways.

## 2020-08-18 ENCOUNTER — Other Ambulatory Visit: Payer: Self-pay

## 2020-08-18 ENCOUNTER — Ambulatory Visit (INDEPENDENT_AMBULATORY_CARE_PROVIDER_SITE_OTHER): Payer: Medicare Other | Admitting: Family Medicine

## 2020-08-18 DIAGNOSIS — G43711 Chronic migraine without aura, intractable, with status migrainosus: Secondary | ICD-10-CM

## 2020-08-18 MED ORDER — NURTEC 75 MG PO TBDP: 75.0000 mg | ORAL_TABLET | Freq: Every day | ORAL | 11 refills | Status: DC | PRN

## 2020-08-18 NOTE — Progress Notes (Signed)
Botox- 100 units x 2 vials Lot: [id number] Expiration: 11/2022 NDC: 8159-4707-61  Bacteriostatic 0.9% Sodium Chloride- 64mL total Lot: [id number] Expiration: 02/22 NDC: 3578-9784-78     B/B  Consent was given and signed today.

## 2020-08-18 NOTE — Progress Notes (Signed)
Taylor Adams is doing well, today. She has had more headaches over the past three months than she normally does. No obvious triggers. Nurtec works well for abortive therapy. She does clentch teeth leading to headaches.   Consent Form Botulism Toxin Injection For Chronic Migraine    Reviewed orally with patient, additionally signature is on file:  Botulism toxin has been approved by the Federal drug administration for treatment of chronic migraine. Botulism toxin does not cure chronic migraine and it may not be effective in some patients.  The administration of botulism toxin is accomplished by injecting a small amount of toxin into the muscles of the neck and head. Dosage must be titrated for each individual. Any benefits resulting from botulism toxin tend to wear off after 3 months with a repeat injection required if benefit is to be maintained. Injections are usually done every 3-4 months with maximum effect peak achieved by about 2 or 3 weeks. Botulism toxin is expensive and you should be sure of what costs you will incur resulting from the injection.  The side effects of botulism toxin use for chronic migraine may include:   -Transient, and usually mild, facial weakness with facial injections  -Transient, and usually mild, head or neck weakness with head/neck injections  -Reduction or loss of forehead facial animation due to forehead muscle weakness  -Eyelid drooping  -Dry eye  -Pain at the site of injection or bruising at the site of injection  -Double vision  -Potential unknown long term risks   Contraindications: You should not have Botox if you are pregnant, nursing, allergic to albumin, have an infection, skin condition, or muscle weakness at the site of the injection, or have myasthenia gravis, Lambert-Eaton syndrome, or ALS.  It is also possible that as with any injection, there may be an allergic reaction or no effect from the medication. Reduced effectiveness after repeated  injections is sometimes seen and rarely infection at the injection site may occur. All care will be taken to prevent these side effects. If therapy is given over a long time, atrophy and wasting in the muscle injected may occur. Occasionally the patient's become refractory to treatment because they develop antibodies to the toxin. In this event, therapy needs to be modified.  I have read the above information and consent to the administration of botulism toxin.    BOTOX PROCEDURE NOTE FOR MIGRAINE HEADACHE  Contraindications and precautions discussed with patient(above). Aseptic procedure was observed and patient tolerated procedure. Procedure performed by Shawnie Dapper, FNP-C.   The condition has existed for more than 6 months, and pt does not have a diagnosis of ALS, Myasthenia Gravis or Lambert-Eaton Syndrome.  Risks and benefits of injections discussed and pt agrees to proceed with the procedure.  Written consent obtained  These injections are medically necessary. Pt  receives good benefits from these injections. These injections do not cause sedations or hallucinations which the oral therapies may cause.   Description of procedure:  The patient was placed in a sitting position. The standard protocol was used for Botox as follows, with 5 units of Botox injected at each site:  -Procerus muscle, midline injection  -Corrugator muscle, bilateral injection  -Frontalis muscle, bilateral injection, with 2 sites each side, medial injection was performed in the upper one third of the frontalis muscle, in the region vertical from the medial inferior edge of the superior orbital rim. The lateral injection was again in the upper one third of the forehead vertically above the lateral limbus  of the cornea, 1.5 cm lateral to the medial injection site.  -Temporalis muscle injection, 4 sites, bilaterally. The first injection was 3 cm above the tragus of the ear, second injection site was 1.5 cm to 3 cm up  from the first injection site in line with the tragus of the ear. The third injection site was 1.5-3 cm forward between the first 2 injection sites. The fourth injection site was 1.5 cm posterior to the second injection site. 5th site laterally in the temporalis  muscleat the level of the outer canthus.  -Occipitalis muscle injection, 3 sites, bilaterally. The first injection was done one half way between the occipital protuberance and the tip of the mastoid process behind the ear. The second injection site was done lateral and superior to the first, 1 fingerbreadth from the first injection. The third injection site was 1 fingerbreadth superiorly and medially from the first injection site.  -Cervical paraspinal muscle injection, 2 sites, bilaterally. The first injection site was 1 cm from the midline of the cervical spine, 3 cm inferior to the lower border of the occipital protuberance. The second injection site was 1.5 cm superiorly and laterally to the first injection site.  -Trapezius muscle injection was performed at 3 sites, bilaterally. The first injection site was in the upper trapezius muscle halfway between the inflection point of the neck, and the acromion. The second injection site was one half way between the acromion and the first injection site. The third injection was done between the first injection site and the inflection point of the neck.  -Masseter 5 units bilaterally    Will return for repeat injection in 3 months.   A total of 200 units of Botox was prepared, 165 units of Botox was injected as documented above, any Botox not injected was wasted. The patient tolerated the procedure well, there were no complications of the above procedure.

## 2020-08-20 DIAGNOSIS — R600 Localized edema: Secondary | ICD-10-CM | POA: Diagnosis not present

## 2020-08-20 DIAGNOSIS — K118 Other diseases of salivary glands: Secondary | ICD-10-CM | POA: Diagnosis not present

## 2020-09-01 ENCOUNTER — Ambulatory Visit: Payer: Medicare Other | Admitting: Family Medicine

## 2020-09-03 DIAGNOSIS — I10 Essential (primary) hypertension: Secondary | ICD-10-CM | POA: Diagnosis not present

## 2020-09-03 DIAGNOSIS — E1040 Type 1 diabetes mellitus with diabetic neuropathy, unspecified: Secondary | ICD-10-CM | POA: Diagnosis not present

## 2020-09-03 DIAGNOSIS — M810 Age-related osteoporosis without current pathological fracture: Secondary | ICD-10-CM | POA: Diagnosis not present

## 2020-09-10 DIAGNOSIS — R82998 Other abnormal findings in urine: Secondary | ICD-10-CM | POA: Diagnosis not present

## 2020-09-10 DIAGNOSIS — Z4681 Encounter for fitting and adjustment of insulin pump: Secondary | ICD-10-CM | POA: Diagnosis not present

## 2020-09-10 DIAGNOSIS — J45991 Cough variant asthma: Secondary | ICD-10-CM | POA: Diagnosis not present

## 2020-09-10 DIAGNOSIS — M5030 Other cervical disc degeneration, unspecified cervical region: Secondary | ICD-10-CM | POA: Diagnosis not present

## 2020-09-10 DIAGNOSIS — G629 Polyneuropathy, unspecified: Secondary | ICD-10-CM | POA: Diagnosis not present

## 2020-09-10 DIAGNOSIS — I10 Essential (primary) hypertension: Secondary | ICD-10-CM | POA: Diagnosis not present

## 2020-09-10 DIAGNOSIS — Z794 Long term (current) use of insulin: Secondary | ICD-10-CM | POA: Diagnosis not present

## 2020-09-10 DIAGNOSIS — Z1212 Encounter for screening for malignant neoplasm of rectum: Secondary | ICD-10-CM | POA: Diagnosis not present

## 2020-09-10 DIAGNOSIS — I7300 Raynaud's syndrome without gangrene: Secondary | ICD-10-CM | POA: Diagnosis not present

## 2020-09-10 DIAGNOSIS — Z0000 Encounter for general adult medical examination without abnormal findings: Secondary | ICD-10-CM | POA: Diagnosis not present

## 2020-09-10 DIAGNOSIS — E1043 Type 1 diabetes mellitus with diabetic autonomic (poly)neuropathy: Secondary | ICD-10-CM | POA: Diagnosis not present

## 2020-09-10 DIAGNOSIS — E1040 Type 1 diabetes mellitus with diabetic neuropathy, unspecified: Secondary | ICD-10-CM | POA: Diagnosis not present

## 2020-09-10 DIAGNOSIS — G43711 Chronic migraine without aura, intractable, with status migrainosus: Secondary | ICD-10-CM | POA: Diagnosis not present

## 2020-09-10 DIAGNOSIS — E103293 Type 1 diabetes mellitus with mild nonproliferative diabetic retinopathy without macular edema, bilateral: Secondary | ICD-10-CM | POA: Diagnosis not present

## 2020-09-21 DIAGNOSIS — N952 Postmenopausal atrophic vaginitis: Secondary | ICD-10-CM | POA: Diagnosis not present

## 2020-09-21 DIAGNOSIS — Z6821 Body mass index (BMI) 21.0-21.9, adult: Secondary | ICD-10-CM | POA: Diagnosis not present

## 2020-09-21 DIAGNOSIS — Z780 Asymptomatic menopausal state: Secondary | ICD-10-CM | POA: Diagnosis not present

## 2020-09-21 DIAGNOSIS — Z01419 Encounter for gynecological examination (general) (routine) without abnormal findings: Secondary | ICD-10-CM | POA: Diagnosis not present

## 2020-10-27 DIAGNOSIS — R600 Localized edema: Secondary | ICD-10-CM | POA: Diagnosis not present

## 2020-11-18 ENCOUNTER — Ambulatory Visit (INDEPENDENT_AMBULATORY_CARE_PROVIDER_SITE_OTHER): Payer: Medicare Other | Admitting: Family Medicine

## 2020-11-18 DIAGNOSIS — G43709 Chronic migraine without aura, not intractable, without status migrainosus: Secondary | ICD-10-CM

## 2020-11-18 NOTE — Progress Notes (Signed)
11/18/2020 ALL: Botox continues to be effective in migraine management. She feels Nurtec works well for abortive therapy. She does have a small amount of oxycodone she uses for a rare emergency. Last rx was for 60 tablets on 08/2019. She does take tramadol 50mg  BID prescribed by PCP. PDMP reviewed and shows appropriate refills.  She notes worsening of migraines the week or two before next botox procedure. She is working with for dry mouth related to Sjogrens. She is planning to have stents placed next month. Considering Botox therapy for dry mouth. She continues to note significant benefit of masseter injections for TMJ.    08/18/2020 ALL: Taylor Adams is doing well, today. She has had more headaches over the past three months than she normally does. No obvious triggers. Nurtec works well for abortive therapy. She does clentch teeth leading to headaches.   Consent Form Botulism Toxin Injection For Chronic Migraine    Reviewed orally with patient, additionally signature is on file:  Botulism toxin has been approved by the Federal drug administration for treatment of chronic migraine. Botulism toxin does not cure chronic migraine and it may not be effective in some patients.  The administration of botulism toxin is accomplished by injecting a small amount of toxin into the muscles of the neck and head. Dosage must be titrated for each individual. Any benefits resulting from botulism toxin tend to wear off after 3 months with a repeat injection required if benefit is to be maintained. Injections are usually done every 3-4 months with maximum effect peak achieved by about 2 or 3 weeks. Botulism toxin is expensive and you should be sure of what costs you will incur resulting from the injection.  The side effects of botulism toxin use for chronic migraine may include:   -Transient, and usually mild, facial weakness with facial injections  -Transient, and usually mild, head or neck weakness  with head/neck injections  -Reduction or loss of forehead facial animation due to forehead muscle weakness  -Eyelid drooping  -Dry eye  -Pain at the site of injection or bruising at the site of injection  -Double vision  -Potential unknown long term risks   Contraindications: You should not have Botox if you are pregnant, nursing, allergic to albumin, have an infection, skin condition, or muscle weakness at the site of the injection, or have myasthenia gravis, Lambert-Eaton syndrome, or ALS.  It is also possible that as with any injection, there may be an allergic reaction or no effect from the medication. Reduced effectiveness after repeated injections is sometimes seen and rarely infection at the injection site may occur. All care will be taken to prevent these side effects. If therapy is given over a long time, atrophy and wasting in the muscle injected may occur. Occasionally the patient's become refractory to treatment because they develop antibodies to the toxin. In this event, therapy needs to be modified.  I have read the above information and consent to the administration of botulism toxin.    BOTOX PROCEDURE NOTE FOR MIGRAINE HEADACHE  Contraindications and precautions discussed with patient(above). Aseptic procedure was observed and patient tolerated procedure. Procedure performed by Burna Mortimer, FNP-C.   The condition has existed for more than 6 months, and pt does not have a diagnosis of ALS, Myasthenia Gravis or Lambert-Eaton Syndrome.  Risks and benefits of injections discussed and pt agrees to proceed with the procedure.  Written consent obtained  These injections are medically necessary. Pt  receives good benefits from  these injections. These injections do not cause sedations or hallucinations which the oral therapies may cause.   Description of procedure:  The patient was placed in a sitting position. The standard protocol was used for Botox as follows, with 5 units of  Botox injected at each site:  -Procerus muscle, midline injection  -Corrugator muscle, bilateral injection  -Frontalis muscle, bilateral injection, with 2 sites each side, medial injection was performed in the upper one third of the frontalis muscle, in the region vertical from the medial inferior edge of the superior orbital rim. The lateral injection was again in the upper one third of the forehead vertically above the lateral limbus of the cornea, 1.5 cm lateral to the medial injection site.  -Temporalis muscle injection, 4 sites, bilaterally. The first injection was 3 cm above the tragus of the ear, second injection site was 1.5 cm to 3 cm up from the first injection site in line with the tragus of the ear. The third injection site was 1.5-3 cm forward between the first 2 injection sites. The fourth injection site was 1.5 cm posterior to the second injection site. 5th site laterally in the temporalis  muscleat the level of the outer canthus.  -Occipitalis muscle injection, 3 sites, bilaterally. The first injection was done one half way between the occipital protuberance and the tip of the mastoid process behind the ear. The second injection site was done lateral and superior to the first, 1 fingerbreadth from the first injection. The third injection site was 1 fingerbreadth superiorly and medially from the first injection site.  -Cervical paraspinal muscle injection, 2 sites, bilaterally. The first injection site was 1 cm from the midline of the cervical spine, 3 cm inferior to the lower border of the occipital protuberance. The second injection site was 1.5 cm superiorly and laterally to the first injection site.  -Trapezius muscle injection was performed at 3 sites, bilaterally. The first injection site was in the upper trapezius muscle halfway between the inflection point of the neck, and the acromion. The second injection site was one half way between the acromion and the first injection site.  The third injection was done between the first injection site and the inflection point of the neck.  -Masseter 5 units bilaterally    Will return for repeat injection in 3 months.   A total of 200 units of Botox was prepared, 165 units of Botox was injected as documented above, any Botox not injected was wasted. The patient tolerated the procedure well, there were no complications of the above procedure.

## 2020-11-18 NOTE — Progress Notes (Signed)
Botox- 100 units x 2 vial Lot: [id number] Expiration: 3/24 NDC: 4166-0630-16  Bacteriostatic 0.9% Sodium Chloride- 73mL total WFU:**[id number] Expiration: 2/23 NDC: 32202-542-70  Dx: G43.711 BB

## 2020-11-23 ENCOUNTER — Other Ambulatory Visit: Payer: Self-pay | Admitting: Neurology

## 2020-11-23 DIAGNOSIS — G43711 Chronic migraine without aura, intractable, with status migrainosus: Secondary | ICD-10-CM

## 2020-11-23 MED ORDER — OXYCODONE-ACETAMINOPHEN 10-325 MG PO TABS: 1.0000 | ORAL_TABLET | Freq: Four times a day (QID) | ORAL | 0 refills | Status: DC | PRN

## 2020-11-25 DIAGNOSIS — K118 Other diseases of salivary glands: Secondary | ICD-10-CM | POA: Diagnosis not present

## 2020-11-25 DIAGNOSIS — G43709 Chronic migraine without aura, not intractable, without status migrainosus: Secondary | ICD-10-CM | POA: Diagnosis not present

## 2020-11-25 DIAGNOSIS — K3184 Gastroparesis: Secondary | ICD-10-CM | POA: Diagnosis not present

## 2020-11-25 DIAGNOSIS — E1043 Type 1 diabetes mellitus with diabetic autonomic (poly)neuropathy: Secondary | ICD-10-CM | POA: Diagnosis not present

## 2020-11-25 DIAGNOSIS — Z79899 Other long term (current) drug therapy: Secondary | ICD-10-CM | POA: Diagnosis not present

## 2020-11-25 DIAGNOSIS — J45909 Unspecified asthma, uncomplicated: Secondary | ICD-10-CM | POA: Diagnosis not present

## 2020-11-25 DIAGNOSIS — R600 Localized edema: Secondary | ICD-10-CM | POA: Diagnosis not present

## 2020-11-25 DIAGNOSIS — Z7951 Long term (current) use of inhaled steroids: Secondary | ICD-10-CM | POA: Diagnosis not present

## 2020-11-25 DIAGNOSIS — K1123 Chronic sialoadenitis: Secondary | ICD-10-CM | POA: Diagnosis not present

## 2020-11-25 DIAGNOSIS — K1120 Sialoadenitis, unspecified: Secondary | ICD-10-CM | POA: Diagnosis not present

## 2020-11-25 DIAGNOSIS — I10 Essential (primary) hypertension: Secondary | ICD-10-CM | POA: Diagnosis not present

## 2020-11-25 HISTORY — PX: SALIVARY GLAND SURGERY: SHX768

## 2020-11-30 DIAGNOSIS — H00012 Hordeolum externum right lower eyelid: Secondary | ICD-10-CM | POA: Diagnosis not present

## 2020-11-30 DIAGNOSIS — E1040 Type 1 diabetes mellitus with diabetic neuropathy, unspecified: Secondary | ICD-10-CM | POA: Diagnosis not present

## 2020-12-04 DIAGNOSIS — H00022 Hordeolum internum right lower eyelid: Secondary | ICD-10-CM | POA: Diagnosis not present

## 2020-12-08 DIAGNOSIS — R600 Localized edema: Secondary | ICD-10-CM | POA: Diagnosis not present

## 2020-12-11 DIAGNOSIS — E103293 Type 1 diabetes mellitus with mild nonproliferative diabetic retinopathy without macular edema, bilateral: Secondary | ICD-10-CM | POA: Diagnosis not present

## 2020-12-11 DIAGNOSIS — I7300 Raynaud's syndrome without gangrene: Secondary | ICD-10-CM | POA: Diagnosis not present

## 2020-12-11 DIAGNOSIS — G629 Polyneuropathy, unspecified: Secondary | ICD-10-CM | POA: Diagnosis not present

## 2020-12-11 DIAGNOSIS — Z794 Long term (current) use of insulin: Secondary | ICD-10-CM | POA: Diagnosis not present

## 2020-12-11 DIAGNOSIS — J45991 Cough variant asthma: Secondary | ICD-10-CM | POA: Diagnosis not present

## 2020-12-11 DIAGNOSIS — G43711 Chronic migraine without aura, intractable, with status migrainosus: Secondary | ICD-10-CM | POA: Diagnosis not present

## 2020-12-11 DIAGNOSIS — I10 Essential (primary) hypertension: Secondary | ICD-10-CM | POA: Diagnosis not present

## 2020-12-11 DIAGNOSIS — M810 Age-related osteoporosis without current pathological fracture: Secondary | ICD-10-CM | POA: Diagnosis not present

## 2020-12-11 DIAGNOSIS — M5030 Other cervical disc degeneration, unspecified cervical region: Secondary | ICD-10-CM | POA: Diagnosis not present

## 2020-12-11 DIAGNOSIS — Z4681 Encounter for fitting and adjustment of insulin pump: Secondary | ICD-10-CM | POA: Diagnosis not present

## 2020-12-11 DIAGNOSIS — F419 Anxiety disorder, unspecified: Secondary | ICD-10-CM | POA: Diagnosis not present

## 2020-12-11 DIAGNOSIS — E1043 Type 1 diabetes mellitus with diabetic autonomic (poly)neuropathy: Secondary | ICD-10-CM | POA: Diagnosis not present

## 2020-12-17 DIAGNOSIS — H00022 Hordeolum internum right lower eyelid: Secondary | ICD-10-CM | POA: Diagnosis not present

## 2020-12-21 DIAGNOSIS — H00022 Hordeolum internum right lower eyelid: Secondary | ICD-10-CM | POA: Diagnosis not present

## 2021-01-04 NOTE — Progress Notes (Signed)
[facility]    Provider:  Dr Lucia Gaskins Requesting Provider: Melvyn Novas Primary Care Provider:  Gaspar Garbe, MD  CC:  Occipital neuralgia  01/05/2021: Patient is here for follow up. Botox continues to be effective in migraine management. She tried dry needling but it hurt too much. Botox has been going extremely well, she has been getting botox since 2020 in our practice. She feels Nurtec works well for abortive therapy. She does have a small amount of oxycodone she uses for a rare emergency. Last rx was for 60 tablets on 08/2019. She does take tramadol 50mg  BID prescribed by PCP. PDMP reviewed and shows appropriate refills.  She notes worsening of migraines the week or two before next botox procedure. She is working with for dry mouth related to Sjogrens. She is planning to have stents placed next month. Considering Botox therapy for dry mouth. She continues to note significant benefit of masseter injections for TMJ. She has been on Ajovy in the past. She has been on botox sine 05/2019 and has had nerve blocks in the past.  Botox has helped tremendously. She is diabetic, she uses dxcom, she has been extremely low in her glucose 100s of time, she wants to know if the glucose may have triggered them to restart. Botox is still working great. She still has them, they are more tolerable, not as intense as well when she gets one, she had surgery on her saliva glands had stents in and went really well, for her sjogren's. Overall she is doing well, she is still baking for the grand kids, she has had at least 50% decreases in frequency and severity. Nurtec helps very well. She has to take one 3-4x a month. Baseline was 25 migraine days a month in 05/2019 prior to initiating botox. She was on Ajovy but doesn;t need it anymore, she uses the nurtec. She has samples because they are so expensive (I gave her samples today 32 of them should last all year)  Patient complains of  symptoms per HPI as well as the following symptoms: headache. Pertinent negatives and positives per HPI. All others negative   Interval history 05/15/2019: patient here for follow up of occipital neuralgia and chronic migraines. She has had 2 nerve blocks with great results but did not last more than 24 hours before the pain returned. She has daily headaches. 24 x7, every day, only one day she did not have a migraine. Migrainous features. She has a lot of neck pain, the pain radiates from the back of the head all the way to the top and sides of the head, tender to the touch, can be brief and stabbing and shooting but has headaches and migraines in between episodes, severe, daily, continuous and severely affecting her life. MRi of the brain was unremarkable (wo contrast). We discussed her options again, oral medication, CGRP, will start Botox.   HPI:  Taylor Adams is a 70 y.o. female here as requested by Tisovec, 74, MD for migraines and headaches. She had migraines at the age of 66 and they were severe and they lasted for 2 months and then would get one sporadically had not had one in years. In March/April she started taking Zyrtec for sinus problems, she started having pressure behind the eyes. On June 19th she started an antibiotic. She took augmentin and pressure never became better. She saw ENT and her sinuses were full of mucous but she had a CT scan and he  recommended Dr. Vickey Huger. She has a headache right now, it is behind the eyes and radiates to the right back of the head. Like her head is in a vice grip. Daily. 24 x7, every day, only one day she did not have a migraine. Migrainous features. Percocet makes it tolerable.  She has a lot of neck pain, the pain radiates from the back of the head all the way to the top and sides of the head, tender to the touch, can be brief and stabbing and shooting but has headache in between episodes, severe, daily, continuous and severely affecting her  life.  Review of Systems: Patient complains of symptoms per HPI as well as the following symptoms: no CP, no SOB, no vision changes. Pertinent negatives and positives per HPI. All others negative.   Social History   Socioeconomic History  . Marital status: Married    Spouse name: Not on file  . Number of children: Not on file  . Years of education: Not on file  . Highest education level: Not on file  Occupational History  . Not on file  Tobacco Use  . Smoking status: Never Smoker  . Smokeless tobacco: Never Used  Vaping Use  . Vaping Use: Never used  Substance and Sexual Activity  . Alcohol use: Yes    Comment: glass of wine "every now and then"  . Drug use: No  . Sexual activity: Not on file  Other Topics Concern  . Not on file  Social History Narrative   Lives at home with her husband   Right handed   Caffeine: 2 cups per day   Social Determinants of Health   Financial Resource Strain: Not on file  Food Insecurity: Not on file  Transportation Needs: Not on file  Physical Activity: Not on file  Stress: Not on file  Social Connections: Not on file  Intimate Partner Violence: Not on file    Family History  Problem Relation Age of Onset  . Colon polyps Father   . Heart disease Father   . Diabetes Father   . Diabetes Mother   . Heart disease Mother   . Heart disease Brother   . Allergies Brother   . Allergies Sister   . Colon cancer Neg Hx   . Rectal cancer Neg Hx   . Stomach cancer Neg Hx   . Esophageal cancer Neg Hx     Past Medical History:  Diagnosis Date  . Anxiety   . Arthritis   . Benign colon polyp   . Cataracts, bilateral   . Depression   . Diabetes mellitus   . Diarrhea   . Gastroparesis   . Headache   . Intestinal bacterial overgrowth   . PONV (postoperative nausea and vomiting)    history of n/v  . SBO (small bowel obstruction) [facility])     Patient Active Problem List   Diagnosis Date Noted  . Chronic migraine without aura, with  intractable migraine, so stated, with status migrainosus 05/14/2019  . Cough variant asthma vs UACS 02/01/2018  . SBO (small bowel obstruction) (HCC) 10/06/2013  . DIABETES MELLITUS 05/29/2008  . GASTROPARESIS 05/29/2008  . COLONIC POLYPS, BENIGN, HX OF 05/29/2008    Past Surgical History:  Procedure Laterality Date  . ABDOMINAL HYSTERECTOMY    . APPENDECTOMY    . BREAST EXCISIONAL BIOPSY Bilateral    benign  . BREAST SURGERY     biopsy  . CATARACT EXTRACTION Bilateral 2017   Dr. Vonna Kotyk  .  CATARACT EXTRACTION, BILATERAL    . EYE SURGERY    . ILEOCECETOMY  10/07/2013   Procedure: Ferne CoeILEOCECETOMY;  Surgeon: Axel FillerArmando Ramirez, MD;  [facility];  Service: General;;  . LAPAROTOMY N/A 10/07/2013   Procedure: EXPLORATORY LAPAROTOMY;  Surgeon: Axel FillerArmando Ramirez, MD;  [facility];  Service: General;  Laterality: N/A;  . LYSIS OF ADHESION  10/07/2013   Procedure: LYSIS OF ADHESION;  Surgeon: Axel FillerArmando Ramirez, MD;  [facility];  Service: General;;  . NASAL SEPTOPLASTY W/ TURBINOPLASTY Bilateral 12/28/2016   Procedure: NASAL SEPTOPLASTY WITH  BILATARAL TURBINATE REDUCTION;  Surgeon: Newman PiesSu Teoh, MD;  Location: [facility] OR;  Service: ENT;  Laterality: Bilateral;  . resection of lymph node in neck    . ROTATOR CUFF REPAIR     right side  . SALIVARY GLAND SURGERY  11/25/2020  . SINUS ENDO WITH FUSION Bilateral 12/28/2016   Procedure: ENDOSCOPIC TOTAL ETHMOIDECTOMY,ENDOSCOPIC MAXILLARY ANTROSTOMY ;  Surgeon: Newman PiesSu Teoh, MD;  Location: [facility] OR;  Service: ENT;  Laterality: Bilateral;  . YAG LASER APPLICATION Bilateral 2018    Current Outpatient Medications  Medication Sig Dispense Refill  . ALPRAZolam (XANAX) 0.5 MG tablet Take 1 tablet (0.5 mg total) by mouth 2 (two) times daily as needed for anxiety. 30 tablet 0  . B-D ULTRA-FINE 33 LANCETS MISC     . BAYER CONTOUR NEXT TEST test strip     . Biotin 5000 MCG TABS Take 5,000 mcg by mouth daily.     . budesonide-formoterol (SYMBICORT) 80-4.5 MCG/ACT inhaler  Inhale 2 puffs into the lungs 2 (two) times daily. 1 Inhaler 11  . Cholecalciferol (VITAMIN D3) 1000 units CAPS Take 1,000 Units by mouth 2 (two) times daily.     . cyclobenzaprine (FLEXERIL) 10 MG tablet Take 10 mg by mouth as needed.    Marland Kitchen. GLUCAGEN HYPOKIT 1 MG SOLR injection USE AS NEEDED FOR SEVERE HYPOGLYCEMIA  6  . hydrOXYzine (ATARAX/VISTARIL) 25 MG tablet Take 25 mg by mouth 3 (three) times daily as needed.    . hyoscyamine (LEVSIN SL) 0.125 MG SL tablet Place 0.125 mg under the tongue every 4 (four) hours as needed for cramping.     . Insulin Human (INSULIN PUMP) 100 unit/ml SOLN Inject into the skin continuous. Novolog 100 unit/mL Basal Rate: 12 am to 4:30 am  0.375 4:30 am to 8 am   0.45 8 am to 12 pm    0.525 12 pm to 3 pm  0.45 3 pm to 9 pm 0.475 9 pm to 12 am 0.425    . losartan (COZAAR) 25 MG tablet Take 50 mg by mouth daily.    . magnesium oxide (MAG-OX) 400 MG tablet Take 400 mg by mouth at bedtime.    . Multiple Vitamin (MULTIVITAMIN) tablet Take 1 tablet by mouth daily. Women's 50+    . oxyCODONE-acetaminophen (PERCOCET) 10-325 MG tablet Take prn up to bid for severe pain. 21 tablet 0  . oxyCODONE-acetaminophen (PERCOCET) 10-325 MG tablet Take 1 tablet by mouth every 6 (six) hours as needed for pain. 30 tablet 0  . Probiotic Product (VSL#3) CAPS Take 1 capsule by mouth daily. 30 capsule 11  . Rimegepant Sulfate (NURTEC) 75 MG TBDP Take 75 mg by mouth daily as needed. For migraines. Take as close to onset of migraine as possible. One daily maximum. 8 tablet 11  . Rimegepant Sulfate (NURTEC) 75 MG TBDP Take 75 mg by mouth daily as needed. For migraines. Take as close to onset of migraine  as possible. One daily maximum. 32 tablet 0   No current facility-administered medications for this visit.    Allergies as of 01/05/2021 - Review Complete 01/05/2021  Allergen Reaction Noted  . Codeine Itching 04/15/2016  . Latex Rash 04/15/2016    Vitals: BP 124/73 (BP Location:  Right Arm, Patient Position: Sitting)   Pulse 69   Ht 5\' 2"  (1.575 m)   Wt 114 lb (51.7 kg)   BMI 20.85 kg/m  Last Weight:  Wt Readings from Last 1 Encounters:  01/05/21 114 lb (51.7 kg)   Last Height:   Ht Readings from Last 1 Encounters:  01/05/21 5\' 2"  (1.575 m)   Exam: NAD, pleasant                  Speech:    Speech is normal; fluent and spontaneous with normal comprehension.  Cognition:    The patient is oriented to person, place, and time;     recent and remote memory intact;     language fluent;    Cranial Nerves:    The pupils are equal, round, and reactive to light.Trigeminal sensation is intact and the muscles of mastication are normal. The face is symmetric. The palate elevates in the midline. Hearing intact. Voice is normal. Shoulder shrug is normal. The tongue has normal motion without fasciculations.   Coordination:  No dysmetria  Motor Observation:    No asymmetry, no atrophy, and no involuntary movements noted. Tone:    Normal muscle tone.     Strength:    Strength is V/V in the upper and lower limbs.      Sensation: intact to LT    Assessment/Plan: This is a 70 year old patient with chronic migraines and occipital neuralgia.  Doing great!  - Doing excellent on Botox alone and nurtec prn  (does not need ajovy, was on it but does great on botox and nurtec alone). Gave her samples of the nurtec today, they are very expensive, should last her all year long.  - feels weaker, disuse, imbalance, neck pain - physical therapy at [facility] - Cervical Myofascial Pain Syndrome; Dry Needling [facility] - she did not like it - oxy very sparingly, she takes like 20 pills a year for sever migraines, I am ok with refilling  Orders Placed This Encounter  Procedures  . Ambulatory referral to Physical Therapy   Meds ordered this encounter  Medications  . Rimegepant Sulfate (NURTEC) 75 MG TBDP    Sig: Take 75 mg by mouth daily as needed. For migraines. Take as  close to onset of migraine as possible. One daily maximum.    Dispense:  32 tablet    Refill:  0    6/24     Cc: Tisovec, [id number], MD,    7/24, MD  [facility] [address] [place], [address] [place]  Phone [phone] Fax [phone]  I spent 30 minutes of face-to-face and non-face-to-face time with patient on the  1. Chronic migraine without aura, with intractable migraine, so stated, with status migrainosus   2. Disuse muscle atrophy   3. Weakness   4. Cervical myofascial pain syndrome    diagnosis.  This included previsit chart review, lab review, study review, order entry, electronic health record documentation, patient education on the different diagnostic and therapeutic options, counseling and coordination of care, risks and benefits of management, compliance, or risk factor reduction

## 2021-01-05 ENCOUNTER — Encounter: Payer: Self-pay | Admitting: Neurology

## 2021-01-05 ENCOUNTER — Ambulatory Visit (INDEPENDENT_AMBULATORY_CARE_PROVIDER_SITE_OTHER): Payer: Medicare Other | Admitting: Neurology

## 2021-01-05 VITALS — BP 124/73 | HR 69 | Ht 62.0 in | Wt 114.0 lb

## 2021-01-05 DIAGNOSIS — M6250 Muscle wasting and atrophy, not elsewhere classified, unspecified site: Secondary | ICD-10-CM

## 2021-01-05 DIAGNOSIS — M7918 Myalgia, other site: Secondary | ICD-10-CM

## 2021-01-05 DIAGNOSIS — G43711 Chronic migraine without aura, intractable, with status migrainosus: Secondary | ICD-10-CM

## 2021-01-05 DIAGNOSIS — R531 Weakness: Secondary | ICD-10-CM | POA: Diagnosis not present

## 2021-01-05 MED ORDER — NURTEC 75 MG PO TBDP: 75.0000 mg | ORAL_TABLET | Freq: Every day | ORAL | 0 refills | Status: AC | PRN

## 2021-01-28 ENCOUNTER — Ambulatory Visit: Payer: Medicare Other | Attending: Neurology | Admitting: Physical Therapy

## 2021-01-28 ENCOUNTER — Other Ambulatory Visit: Payer: Self-pay

## 2021-01-28 ENCOUNTER — Encounter: Payer: Self-pay | Admitting: Physical Therapy

## 2021-01-28 DIAGNOSIS — M542 Cervicalgia: Secondary | ICD-10-CM | POA: Insufficient documentation

## 2021-01-28 DIAGNOSIS — R293 Abnormal posture: Secondary | ICD-10-CM

## 2021-01-28 DIAGNOSIS — M25611 Stiffness of right shoulder, not elsewhere classified: Secondary | ICD-10-CM | POA: Insufficient documentation

## 2021-01-28 DIAGNOSIS — M6281 Muscle weakness (generalized): Secondary | ICD-10-CM | POA: Insufficient documentation

## 2021-01-28 DIAGNOSIS — M25612 Stiffness of left shoulder, not elsewhere classified: Secondary | ICD-10-CM | POA: Diagnosis not present

## 2021-01-28 NOTE — Therapy (Addendum)
[facility] [address], [address] [place], [place], [address] Phone: [phone]   Fax:  [phone]  Physical Therapy Evaluation  Patient Details  Name: Taylor Adams [facility] MRN: [id number] Date of Birth: 09-11-1951 Referring Provider (PT): Naomie Dean, MD   Encounter Date: 01/28/2021   PT End of Session - 01/28/21 1106    Visit Number 1    Date for PT Re-Evaluation 03/12/21    Authorization Type Medicare    Progress Note Due on Visit 10    PT Start Time 1016    PT Stop Time 1056    PT Time Calculation (min) 40 min    Activity Tolerance Patient tolerated treatment well;No increased pain    Behavior During Therapy WFL for tasks assessed/performed           Past Medical History:  Diagnosis Date  . Anxiety   . Arthritis   . Benign colon polyp   . Cataracts, bilateral   . Depression   . Diabetes mellitus   . Diarrhea   . Gastroparesis   . Headache   . Intestinal bacterial overgrowth   . PONV (postoperative nausea and vomiting)    history of n/v  . SBO (small bowel obstruction) (HCC)     Past Surgical History:  Procedure Laterality Date  . ABDOMINAL HYSTERECTOMY    . APPENDECTOMY    . BREAST EXCISIONAL BIOPSY Bilateral    benign  . BREAST SURGERY     biopsy  . CATARACT EXTRACTION Bilateral 2017   Dr. Vonna Kotyk  . CATARACT EXTRACTION, BILATERAL    . EYE SURGERY    . ILEOCECETOMY  10/07/2013   Procedure: Ferne Coe;  Surgeon: Axel Filler, MD;  Location: [facility] OR;  Service: General;;  . LAPAROTOMY N/A 10/07/2013   Procedure: EXPLORATORY LAPAROTOMY;  Surgeon: Axel Filler, MD;  Location: [facility] OR;  Service: General;  Laterality: N/A;  . LYSIS OF ADHESION  10/07/2013   Procedure: LYSIS OF ADHESION;  Surgeon: Axel Filler, MD;  Location: [facility] OR;  Service: General;;  . NASAL SEPTOPLASTY W/ TURBINOPLASTY Bilateral 12/28/2016   Procedure: NASAL SEPTOPLASTY WITH  BILATARAL TURBINATE REDUCTION;  Surgeon: Newman Pies, MD;   Location: [facility] OR;  Service: ENT;  Laterality: Bilateral;  . resection of lymph node in neck    . ROTATOR CUFF REPAIR     right side  . SALIVARY GLAND SURGERY  11/25/2020  . SINUS ENDO WITH FUSION Bilateral 12/28/2016   Procedure: ENDOSCOPIC TOTAL ETHMOIDECTOMY,ENDOSCOPIC MAXILLARY ANTROSTOMY ;  Surgeon: Newman Pies, MD;  Location: [facility] OR;  Service: ENT;  Laterality: Bilateral;  . YAG LASER APPLICATION Bilateral 2018    There were no vitals filed for this visit.    Subjective Assessment - 01/28/21 1024    Subjective Patient presenting due to chronic migraine, cervical and neck pain, and generalized weakness. States that she has intermittent shooting pain that radiates from head to neck. Reports that her activity level has decreased due to COVID, weather, recent surgery, and recent eye infection. Would like to reintroduce exercise to her daily program.    Pertinent History Type 2 DM    How long can you sit comfortably? unlimited    How long can you stand comfortably? 20 minutes    How long can you walk comfortably? 35 minutes    Patient Stated Goals to reintroduce exercise to daily life; to touch toes    Currently in Pain? Yes    Pain Score 5     Pain Location  Head    Pain Orientation Right;Left    Pain Descriptors / Indicators Pressure;Sharp    Pain Type Chronic pain    Pain Radiating Towards bilateral neck    Pain Onset More than a month ago    Pain Frequency Constant              [facility] PT Assessment - 01/28/21 0001      Assessment   Medical Diagnosis G43.711 (ICD-10-CM) - Chronic migraine without aura, with intractable migraine, so stated, with status migrainosus  M62.50 (ICD-10-CM) - Disuse muscle atrophy  R53.1 (ICD-10-CM) - Weakness  M79.18 (ICD-10-CM) - Cervical myofascial pain syndrome    Referring Provider (PT) Naomie Dean, MD    Hand Dominance Right    Next MD Visit 02/23/2021    Prior Therapy Yes - this clinic for head/neck pain      Precautions   Precautions None       Restrictions   Weight Bearing Restrictions No      Balance Screen   Has the patient fallen in the past 6 months No    Has the patient had a decrease in activity level because of a fear of falling?  No    Is the patient reluctant to leave their home because of a fear of falling?  No      Home [employer] information [employer] residence    Living Arrangements Spouse/significant other    Type of Home House    Home Layout One level      Prior Function   Level of Independence Independent      Cognition   Overall Cognitive Status Within Functional Limits for tasks assessed      Observation/Other Assessments   Focus on Therapeutic Outcomes (FOTO)  47      Posture/Postural Control   Posture Comments mild forward head and rounded shoulders      ROM / Strength   AROM / PROM / Strength AROM;Strength      AROM   Right Shoulder Flexion 130 Degrees    Right Shoulder ABduction 130 Degrees    Right Shoulder Internal Rotation --   T10   Right Shoulder External Rotation --   T2   Left Shoulder Flexion 130 Degrees    Left Shoulder ABduction 165 Degrees    Left Shoulder Internal Rotation --   T7   Left Shoulder External Rotation --   T3   Cervical Flexion 40    Cervical Extension 33    Cervical - Right Side Bend 30    Cervical - Left Side Bend 15    Cervical - Right Rotation 40    Cervical - Left Rotation 40      Strength   Right Shoulder Flexion 4/5    Right Shoulder ABduction 4/5    Right Shoulder Internal Rotation 4+/5    Right Shoulder External Rotation 4+/5    Left Shoulder Flexion 4+/5    Left Shoulder ABduction 4+/5    Left Shoulder Internal Rotation 4+/5    Left Shoulder External Rotation 4+/5    Cervical Flexion 4-/5    Cervical Extension 4-/5    Cervical - Right Side Bend 4-/5    Cervical - Left Side Bend 4-/5    Cervical - Right Rotation 4-/5    Cervical - Left Rotation 4-/5      Transfers   Five time sit to stand comments  --      Balance   Balance  Assessed Yes  Standardized Balance Assessment   Standardized Balance Assessment Five Times Sit to Stand    Five times sit to stand comments  16s                      Objective measurements completed on examination: See above findings.               PT Education - 01/28/21 1059    Education Details row; wall squat; Access Code [email or web address]    Person(s) Educated Patient    Methods Explanation;Demonstration;Tactile cues;Verbal cues;Handout    Comprehension Verbalized understanding;Returned demonstration;Verbal cues required;Tactile cues required;Need further instruction            PT Short Term Goals - 01/28/21 1059      PT SHORT TERM GOAL #1   Title Patient will be independent with HEP for continued progression at home.    Time 4    Period Weeks    Status New    Target Date 02/18/21      PT SHORT TERM GOAL #2   Title Patient will demonstrate 40 degrees cervical sidebending bilaterally to indicate improved mobility and decreased pain.    Baseline Lt side bend 15; Rt side bend 30    Time 3    Period Weeks    Status New    Target Date 02/18/21      PT SHORT TERM GOAL #3   Title Patient will demonstrate 140 degrees bilateral shoulder flexion to more readily perform reaching ADLs.    Baseline 130 degrees bilaterally    Time 3    Period Weeks    Status New    Target Date 02/18/21             PT Long Term Goals - 01/28/21 1102      PT LONG TERM GOAL #1   Title Patient will be independent with advanced HEP for long term management of symptoms post D/C.    Time 6    Period Weeks    Status New    Target Date 03/12/21      PT LONG TERM GOAL #2   Title Patient will improved FOTO score to 58 to indicate improved overall function.    Baseline 47    Time 6    Period Weeks    Status New    Target Date 03/12/21      PT LONG TERM GOAL #3   Title Patient will perform five times sit to stand in 12s or less to indicate decreased fall risk.     Baseline 16a    Time 6    Period Weeks    Status New    Target Date 03/12/21      PT LONG TERM GOAL #4   Title Patient will lift 15# from floor level with good mechanics x10 reps to indicate improved functional activity tolerance.    Time 6    Period Weeks    Status New    Target Date 03/12/21                  Plan - 01/28/21 1044    Clinical Impression Statement Patient is a 70 y/o female referred due to chronic migraine, cervical pain, and generalized weakness. PMH includes chronic migraine and type 2 DM. Patient reported activity limitations include self-care ADLs, household activities, and prolonged static standing. Patient further reports generalized decrease in functional activity tolerance. She exhibits cervical strength impairments as gross cervical strengh 4-/5. Lt  shoulder AROM significantly impaired as patient unable to demonstrate flexion or abduction >140 degrees. Patient requiring 16s to complete five time sit to stand indicating decreased bil LE activity tolerance and increased fall risk. Would benefit from continued skilled intervention to address impairments for decreased pain and improved general functional activity tolerance.    Personal Factors and Comorbidities Comorbidity 2    Comorbidities chronic migraine, type 2 DM    Examination-Activity Limitations Bend;Dressing;Hygiene/Grooming;Stand    Examination-Participation Restrictions Meal Prep;Cleaning    Stability/Clinical Decision Making Stable/Uncomplicated    Clinical Decision Making Low    Rehab Potential Good    PT Frequency 2x / week    PT Duration 6 weeks    PT Treatment/Interventions ADLs/Self Care Home Management;Cryotherapy;Electrical Stimulation;Iontophoresis 4mg /ml Dexamethasone;Moist Heat;Functional mobility training;Therapeutic activities;Therapeutic exercise;Balance training;Neuromuscular re-education;Patient/family education;Manual techniques;Passive range of motion;Dry needling;Taping;Spinal  Manipulations;Joint Manipulations    PT Next Visit Plan review and add to HEP; assess oculomotor; begin cervical/thoracic mobility and postural strengthening    PT Home Exercise Plan Access Code [email or web address]    Consulted and Agree with Plan of Care Patient           Patient will benefit from skilled therapeutic intervention in order to improve the following deficits and impairments:  Decreased balance,Decreased activity tolerance,Decreased endurance,Decreased range of motion,Decreased strength,Impaired perceived functional ability,Improper body mechanics,Postural dysfunction,Pain  Visit Diagnosis: Cervicalgia - Plan: PT plan of care cert/re-cert  Abnormal posture - Plan: PT plan of care cert/re-cert  Muscle weakness (generalized) - Plan: PT plan of care cert/re-cert  Stiffness of left shoulder, not elsewhere classified - Plan: PT plan of care cert/re-cert  Stiffness of right shoulder, not elsewhere classified - Plan: PT plan of care cert/re-cert     Problem List Patient Active Problem List   Diagnosis Date Noted  . Chronic migraine without aura, with intractable migraine, so stated, with status migrainosus 05/14/2019  . Cough variant asthma vs UACS 02/01/2018  . SBO (small bowel obstruction) (HCC) 10/06/2013  . DIABETES MELLITUS 05/29/2008  . GASTROPARESIS 05/29/2008  . COLONIC POLYPS, BENIGN, HX OF 05/29/2008   05/31/2008 PT, DPT  01/28/21 5:14 PM   [facility] [address] [place], [place], [place] Phone: [phone]   Fax:  [phone]  Name: BESSYE STITH MRN: Dicie Beam Date of Birth: [birth year]

## 2021-02-01 ENCOUNTER — Ambulatory Visit: Payer: Medicare Other | Attending: Neurology | Admitting: Physical Therapy

## 2021-02-01 ENCOUNTER — Encounter: Payer: Self-pay | Admitting: Physical Therapy

## 2021-02-01 ENCOUNTER — Other Ambulatory Visit: Payer: Self-pay

## 2021-02-01 DIAGNOSIS — M6281 Muscle weakness (generalized): Secondary | ICD-10-CM | POA: Diagnosis not present

## 2021-02-01 DIAGNOSIS — G4459 Other complicated headache syndrome: Secondary | ICD-10-CM | POA: Diagnosis not present

## 2021-02-01 DIAGNOSIS — M25611 Stiffness of right shoulder, not elsewhere classified: Secondary | ICD-10-CM | POA: Insufficient documentation

## 2021-02-01 DIAGNOSIS — M25612 Stiffness of left shoulder, not elsewhere classified: Secondary | ICD-10-CM | POA: Diagnosis not present

## 2021-02-01 DIAGNOSIS — M542 Cervicalgia: Secondary | ICD-10-CM | POA: Insufficient documentation

## 2021-02-01 DIAGNOSIS — R293 Abnormal posture: Secondary | ICD-10-CM | POA: Diagnosis not present

## 2021-02-01 NOTE — Therapy (Signed)
[facility] [address], [address] [place], [place], [address] Phone: [phone]   Fax:  [phone]  Physical Therapy Treatment  Patient Details  Name: Taylor Adams [facility] MRN: [id number] Date of Birth: 1951/04/05 Referring Provider (PT): Naomie Dean, MD   Encounter Date: 02/01/2021   PT End of Session - 02/01/21 1359    Visit Number 2    Date for PT Re-Evaluation 03/12/21    Authorization Type Medicare    Progress Note Due on Visit 10    PT Start Time 1359    PT Stop Time 1439    PT Time Calculation (min) 40 min    Activity Tolerance Patient tolerated treatment well;No increased pain    Behavior During Therapy WFL for tasks assessed/performed           Past Medical History:  Diagnosis Date  . Anxiety   . Arthritis   . Benign colon polyp   . Cataracts, bilateral   . Depression   . Diabetes mellitus   . Diarrhea   . Gastroparesis   . Headache   . Intestinal bacterial overgrowth   . PONV (postoperative nausea and vomiting)    history of n/v  . SBO (small bowel obstruction) (HCC)     Past Surgical History:  Procedure Laterality Date  . ABDOMINAL HYSTERECTOMY    . APPENDECTOMY    . BREAST EXCISIONAL BIOPSY Bilateral    benign  . BREAST SURGERY     biopsy  . CATARACT EXTRACTION Bilateral 2017   Dr. Vonna Kotyk  . CATARACT EXTRACTION, BILATERAL    . EYE SURGERY    . ILEOCECETOMY  10/07/2013   Procedure: Ferne Coe;  Surgeon: Axel Filler, MD;  Location: [facility] OR;  Service: General;;  . LAPAROTOMY N/A 10/07/2013   Procedure: EXPLORATORY LAPAROTOMY;  Surgeon: Axel Filler, MD;  Location: [facility] OR;  Service: General;  Laterality: N/A;  . LYSIS OF ADHESION  10/07/2013   Procedure: LYSIS OF ADHESION;  Surgeon: Axel Filler, MD;  Location: [facility] OR;  Service: General;;  . NASAL SEPTOPLASTY W/ TURBINOPLASTY Bilateral 12/28/2016   Procedure: NASAL SEPTOPLASTY WITH  BILATARAL TURBINATE REDUCTION;  Surgeon: Newman Pies, MD;   Location: [facility] OR;  Service: ENT;  Laterality: Bilateral;  . resection of lymph node in neck    . ROTATOR CUFF REPAIR     right side  . SALIVARY GLAND SURGERY  11/25/2020  . SINUS ENDO WITH FUSION Bilateral 12/28/2016   Procedure: ENDOSCOPIC TOTAL ETHMOIDECTOMY,ENDOSCOPIC MAXILLARY ANTROSTOMY ;  Surgeon: Newman Pies, MD;  Location: [facility] OR;  Service: ENT;  Laterality: Bilateral;  . YAG LASER APPLICATION Bilateral 2018    There were no vitals filed for this visit.   Subjective Assessment - 02/01/21 1402    Subjective Doing my HEP, and it makes me feel better.    Pertinent History Type 2 DM    Patient Stated Goals to reintroduce exercise to daily life; to touch toes    Currently in Pain? No/denies    Multiple Pain Sites No                             [facility] Adult PT Treatment/Exercise - 02/01/21 0001      Neck Exercises: Seated   Lateral Flexion Both;10 reps      Neck Exercises: Supine   Neck Retraction 5 reps;3 secs;1 rep      Lumbar Exercises: Stretches   Other Lumbar Stretch Exercise open book  5x bil      Lumbar Exercises: Standing   Row Strengthening;Both;10 reps;Theraband    Theraband Level (Row) Level 3 (Green)    Shoulder Extension Strengthening;Both;10 reps;Theraband    Theraband Level (Shoulder Extension) Level 3 (Green)      Lumbar Exercises: Supine   Other Supine Lumbar Exercises Cervical: supine head press 3 sec hold 5x added to HEP      Shoulder Exercises: Pulleys   Flexion 2 minutes    Scaption 2 minutes      Shoulder Exercises: ROM/Strengthening   UBE (Upper Arm Bike) L1 2x2 to end session. PTA present                  PT Education - 02/01/21 1409    Education Details HEP    Person(s) Educated Patient    Methods Explanation;Demonstration;Handout    Comprehension Returned demonstration;Verbalized understanding            PT Short Term Goals - 02/01/21 1423      PT SHORT TERM GOAL #1   Title Patient will be independent with HEP  for continued progression at home.    Time 4    Period Weeks    Status Achieved    Target Date 02/18/21             PT Long Term Goals - 01/28/21 1102      PT LONG TERM GOAL #1   Title Patient will be independent with advanced HEP for long term management of symptoms post D/C.    Time 6    Period Weeks    Status New    Target Date 03/12/21      PT LONG TERM GOAL #2   Title Patient will improved FOTO score to 58 to indicate improved overall function.    Baseline 47    Time 6    Period Weeks    Status New    Target Date 03/12/21      PT LONG TERM GOAL #3   Title Patient will perform five times sit to stand in 12s or less to indicate decreased fall risk.    Baseline 16a    Time 6    Period Weeks    Status New    Target Date 03/12/21      PT LONG TERM GOAL #4   Title Patient will lift 15# from floor level with good mechanics x10 reps to indicate improved functional activity tolerance.    Time 6    Period Weeks    Status New    Target Date 03/12/21                 Plan - 02/01/21 1400    Clinical Impression Statement Pt is independent and compliant with initial HEP. PTA added a few new exercises for mobility of her thoracic, shoulders and cervical spine. No pain with any new exercises.    Personal Factors and Comorbidities Comorbidity 2    Comorbidities chronic migraine, type 2 DM    Examination-Activity Limitations Bend;Dressing;Hygiene/Grooming;Stand    Examination-Participation Restrictions Meal Prep;Cleaning    Stability/Clinical Decision Making Stable/Uncomplicated    Rehab Potential Good    PT Frequency 2x / week    PT Duration 6 weeks    PT Treatment/Interventions ADLs/Self Care Home Management;Cryotherapy;Electrical Stimulation;Iontophoresis 4mg /ml Dexamethasone;Moist Heat;Functional mobility training;Therapeutic activities;Therapeutic exercise;Balance training;Neuromuscular re-education;Patient/family education;Manual techniques;Passive range of  motion;Dry needling;Taping;Spinal Manipulations;Joint Manipulations    PT Next Visit Plan Review new exercises, measure shoulder ROM for goals,  PT Home Exercise Plan Access Code [email or web address]    Consulted and Agree with Plan of Care Patient           Patient will benefit from skilled therapeutic intervention in order to improve the following deficits and impairments:  Decreased balance,Decreased activity tolerance,Decreased endurance,Decreased range of motion,Decreased strength,Impaired perceived functional ability,Improper body mechanics,Postural dysfunction,Pain  Visit Diagnosis: Cervicalgia  Abnormal posture  Muscle weakness (generalized)  Stiffness of left shoulder, not elsewhere classified  Stiffness of right shoulder, not elsewhere classified  Other complicated headache syndrome     Problem List Patient Active Problem List   Diagnosis Date Noted  . Chronic migraine without aura, with intractable migraine, so stated, with status migrainosus 05/14/2019  . Cough variant asthma vs UACS 02/01/2018  . SBO (small bowel obstruction) (HCC) 10/06/2013  . DIABETES MELLITUS 05/29/2008  . GASTROPARESIS 05/29/2008  . COLONIC POLYPS, BENIGN, HX OF 05/29/2008    Milon Dethloff, PTA 02/01/2021, 2:35 PM  [facility] [address] [place], [place], [address] Phone: [phone]   Fax:  [phone]  Name: Taylor Adams MRN: [id number] Date of Birth: 09-22-1951

## 2021-02-03 ENCOUNTER — Other Ambulatory Visit: Payer: Self-pay

## 2021-02-03 ENCOUNTER — Ambulatory Visit: Payer: Medicare Other | Admitting: Physical Therapy

## 2021-02-03 DIAGNOSIS — G4459 Other complicated headache syndrome: Secondary | ICD-10-CM

## 2021-02-03 DIAGNOSIS — M542 Cervicalgia: Secondary | ICD-10-CM | POA: Diagnosis not present

## 2021-02-03 DIAGNOSIS — M25612 Stiffness of left shoulder, not elsewhere classified: Secondary | ICD-10-CM

## 2021-02-03 DIAGNOSIS — R293 Abnormal posture: Secondary | ICD-10-CM

## 2021-02-03 DIAGNOSIS — M6281 Muscle weakness (generalized): Secondary | ICD-10-CM

## 2021-02-03 DIAGNOSIS — M25611 Stiffness of right shoulder, not elsewhere classified: Secondary | ICD-10-CM

## 2021-02-03 NOTE — Patient Instructions (Signed)
Hooklying Clamshell with Resistance - 1 x daily - 7 x weekly - 2 sets - 10 reps Clamshell - 1 x daily - 7 x weekly - 1 sets - 10 reps Hooklying Gluteal Sets - 1 x daily - 7 x weekly - 1 sets - 10 reps - 3s hold  

## 2021-02-03 NOTE — Therapy (Signed)
[facility] [address] [place], [place], [address] Phone: [phone]   Fax:  [phone]  Physical Therapy Treatment  Patient Details  Name: Taylor Adams MRN: [id number] Date of Birth: Mar 14, 1951 Referring Provider (PT): Naomie Dean, MD   Encounter Date: 02/03/2021   PT End of Session - 02/03/21 1650    Visit Number 3    Date for PT Re-Evaluation 03/12/21    Authorization Type Medicare    Progress Note Due on Visit 10    PT Start Time 1231    PT Stop Time 1313    PT Time Calculation (min) 42 min    Activity Tolerance Patient tolerated treatment well;No increased pain    Behavior During Therapy WFL for tasks assessed/performed           Past Medical History:  Diagnosis Date  . Anxiety   . Arthritis   . Benign colon polyp   . Cataracts, bilateral   . Depression   . Diabetes mellitus   . Diarrhea   . Gastroparesis   . Headache   . Intestinal bacterial overgrowth   . PONV (postoperative nausea and vomiting)    history of n/v  . SBO (small bowel obstruction) (HCC)     Past Surgical History:  Procedure Laterality Date  . ABDOMINAL HYSTERECTOMY    . APPENDECTOMY    . BREAST EXCISIONAL BIOPSY Bilateral    benign  . BREAST SURGERY     biopsy  . CATARACT EXTRACTION Bilateral 2017   Dr. Vonna Kotyk  . CATARACT EXTRACTION, BILATERAL    . EYE SURGERY    . ILEOCECETOMY  10/07/2013   Procedure: Ferne Coe;  Surgeon: Axel Filler, MD;  Location: [facility] OR;  Service: General;;  . LAPAROTOMY N/A 10/07/2013   Procedure: EXPLORATORY LAPAROTOMY;  Surgeon: Axel Filler, MD;  Location: [facility] OR;  Service: General;  Laterality: N/A;  . LYSIS OF ADHESION  10/07/2013   Procedure: LYSIS OF ADHESION;  Surgeon: Axel Filler, MD;  Location: [facility] OR;  Service: General;;  . NASAL SEPTOPLASTY W/ TURBINOPLASTY Bilateral 12/28/2016   Procedure: NASAL SEPTOPLASTY WITH  BILATARAL TURBINATE REDUCTION;  Surgeon: Newman Pies, MD;   Location: [facility] OR;  Service: ENT;  Laterality: Bilateral;  . resection of lymph node in neck    . ROTATOR CUFF REPAIR     right side  . SALIVARY GLAND SURGERY  11/25/2020  . SINUS ENDO WITH FUSION Bilateral 12/28/2016   Procedure: ENDOSCOPIC TOTAL ETHMOIDECTOMY,ENDOSCOPIC MAXILLARY ANTROSTOMY ;  Surgeon: Newman Pies, MD;  Location: [facility] OR;  Service: ENT;  Laterality: Bilateral;  . YAG LASER APPLICATION Bilateral 2018    There were no vitals filed for this visit.   Subjective Assessment - 02/03/21 1233    Subjective Patient reports some soreness but states that it is "good soreness". Other than that she feels great.    Pertinent History Type 2 DM    How long can you sit comfortably? unlimited    How long can you stand comfortably? 20 minutes    How long can you walk comfortably? 35 minutes    Patient Stated Goals to reintroduce exercise to daily life; to touch toes    Currently in Pain? No/denies                             [facility] Adult PT Treatment/Exercise - 02/03/21 0001      Neck Exercises: Standing   Other Standing Exercises  doorway pec stretch 2 x 10s hold      Neck Exercises: Seated   Neck Retraction 10 reps;3 secs    Lateral Flexion Both;10 reps    Other Seated Exercise thoracic extension in chair 5 x 5s hold      Neck Exercises: Supine   Neck Retraction Limitations with ball push; 2 x 5 repetitions Rt/Lt      Lumbar Exercises: Stretches   Other Lumbar Stretch Exercise open book 5x bil      Lumbar Exercises: Standing   Row Strengthening;Both;Theraband    Theraband Level (Row) Level 3 (Green)    Row Limitations 12 repetitions      Shoulder Exercises: Supine   Protraction Both;AROM;12 reps      Shoulder Exercises: ROM/Strengthening   UBE (Upper Arm Bike) L1 2x2 ; PT present to assess progress      Manual Therapy   Manual Therapy Soft tissue mobilization;Joint mobilization    Joint Mobilization PAs C1-T2 Grade I-III    Soft tissue mobilization STM:  bilateral upper trap, cervical paraspinals, levator scap, suboccipitals                    PT Short Term Goals - 02/03/21 1614      PT SHORT TERM GOAL #3   Title Patient will demonstrate 140 degrees bilateral shoulder flexion to more readily perform reaching ADLs.    Baseline 130 degrees bilaterally    Time 3    Period Weeks    Status On-going   135 bilaterally   Target Date 02/18/21             PT Long Term Goals - 01/28/21 1102      PT LONG TERM GOAL #1   Title Patient will be independent with advanced HEP for long term management of symptoms post D/C.    Time 6    Period Weeks    Status New    Target Date 03/12/21      PT LONG TERM GOAL #2   Title Patient will improved FOTO score to 58 to indicate improved overall function.    Baseline 47    Time 6    Period Weeks    Status New    Target Date 03/12/21      PT LONG TERM GOAL #3   Title Patient will perform five times sit to stand in 12s or less to indicate decreased fall risk.    Baseline 16a    Time 6    Period Weeks    Status New    Target Date 03/12/21      PT LONG TERM GOAL #4   Title Patient will lift 15# from floor level with good mechanics x10 reps to indicate improved functional activity tolerance.    Time 6    Period Weeks    Status New    Target Date 03/12/21                 Plan - 02/03/21 1611    Clinical Impression Statement Patient demonstrates improved proprioceptive awareness of scapulas bilaterally as she required minimal cuing for tband row. Verbal cuing provided to prevent increased upper trap and levator scap activation when performing seated cervical retraction. Would benefit from continued skilled intervention for decreased pain to improve activity tolerance.    Personal Factors and Comorbidities Comorbidity 2    Comorbidities chronic migraine, type 2 DM    Examination-Activity Limitations Bend;Dressing;Hygiene/Grooming;Stand    Examination-Participation Restrictions  Meal Prep;Cleaning  Rehab Potential Good    PT Frequency 2x / week    PT Duration 6 weeks    PT Treatment/Interventions ADLs/Self Care Home Management;Cryotherapy;Electrical Stimulation;Iontophoresis 4mg /ml Dexamethasone;Moist Heat;Functional mobility training;Therapeutic activities;Therapeutic exercise;Balance training;Neuromuscular re-education;Patient/family education;Manual techniques;Passive range of motion;Dry needling;Taping;Spinal Manipulations;Joint Manipulations    PT Next Visit Plan continue postural strengthening and scapular stabilization; manual as needed; continue cervical and thoracic mobility    PT Home Exercise Plan Access Code [email or web address]    Consulted and Agree with Plan of Care Patient           Patient will benefit from skilled therapeutic intervention in order to improve the following deficits and impairments:  Decreased balance,Decreased activity tolerance,Decreased endurance,Decreased range of motion,Decreased strength,Impaired perceived functional ability,Improper body mechanics,Postural dysfunction,Pain  Visit Diagnosis: Cervicalgia  Abnormal posture  Muscle weakness (generalized)  Stiffness of left shoulder, not elsewhere classified  Stiffness of right shoulder, not elsewhere classified  Other complicated headache syndrome     Problem List Patient Active Problem List   Diagnosis Date Noted  . Chronic migraine without aura, with intractable migraine, so stated, with status migrainosus 05/14/2019  . Cough variant asthma vs UACS 02/01/2018  . SBO (small bowel obstruction) (HCC) 10/06/2013  . DIABETES MELLITUS 05/29/2008  . GASTROPARESIS 05/29/2008  . COLONIC POLYPS, BENIGN, HX OF 05/29/2008   05/31/2008 PT, DPT  02/03/21 4:52 PM    [facility] [address] [place], [place], [place] Phone: [phone]   Fax:  [phone]  Name: Taylor Adams MRN: Dicie Beam Date of Birth:  Sep 23, 1951

## 2021-02-08 ENCOUNTER — Other Ambulatory Visit: Payer: Self-pay

## 2021-02-08 ENCOUNTER — Encounter: Payer: Self-pay | Admitting: Physical Therapy

## 2021-02-08 ENCOUNTER — Ambulatory Visit: Payer: Medicare Other | Admitting: Physical Therapy

## 2021-02-08 DIAGNOSIS — M6281 Muscle weakness (generalized): Secondary | ICD-10-CM

## 2021-02-08 DIAGNOSIS — M25612 Stiffness of left shoulder, not elsewhere classified: Secondary | ICD-10-CM

## 2021-02-08 DIAGNOSIS — R293 Abnormal posture: Secondary | ICD-10-CM | POA: Diagnosis not present

## 2021-02-08 DIAGNOSIS — G4459 Other complicated headache syndrome: Secondary | ICD-10-CM

## 2021-02-08 DIAGNOSIS — M25611 Stiffness of right shoulder, not elsewhere classified: Secondary | ICD-10-CM

## 2021-02-08 DIAGNOSIS — M542 Cervicalgia: Secondary | ICD-10-CM | POA: Diagnosis not present

## 2021-02-08 NOTE — Therapy (Signed)
[facility] [address] [place], [place], [address] Phone: [phone]   Fax:  [phone]  Physical Therapy Treatment  Patient Details  Name: Taylor Adams [facility] MRN: [id number] Date of Birth: July 25, 1951 Referring Provider (PT): Naomie Dean, MD   Encounter Date: 02/08/2021   PT End of Session - 02/08/21 1020    Visit Number 4    Date for PT Re-Evaluation 03/12/21    Authorization Type Medicare    Progress Note Due on Visit 10    PT Start Time 1018    PT Stop Time 1056    PT Time Calculation (min) 38 min    Activity Tolerance Patient tolerated treatment well    Behavior During Therapy [facility] for tasks assessed/performed           Past Medical History:  Diagnosis Date  . Anxiety   . Arthritis   . Benign colon polyp   . Cataracts, bilateral   . Depression   . Diabetes mellitus   . Diarrhea   . Gastroparesis   . Headache   . Intestinal bacterial overgrowth   . PONV (postoperative nausea and vomiting)    history of n/v  . SBO (small bowel obstruction) (HCC)     Past Surgical History:  Procedure Laterality Date  . ABDOMINAL HYSTERECTOMY    . APPENDECTOMY    . BREAST EXCISIONAL BIOPSY Bilateral    benign  . BREAST SURGERY     biopsy  . CATARACT EXTRACTION Bilateral 2017   Dr. Vonna Kotyk  . CATARACT EXTRACTION, BILATERAL    . EYE SURGERY    . ILEOCECETOMY  10/07/2013   Procedure: Ferne Coe;  Surgeon: Axel Filler, MD;  Location: [facility] OR;  Service: General;;  . LAPAROTOMY N/A 10/07/2013   Procedure: EXPLORATORY LAPAROTOMY;  Surgeon: Axel Filler, MD;  Location: [facility] OR;  Service: General;  Laterality: N/A;  . LYSIS OF ADHESION  10/07/2013   Procedure: LYSIS OF ADHESION;  Surgeon: Axel Filler, MD;  Location: [facility] OR;  Service: General;;  . NASAL SEPTOPLASTY W/ TURBINOPLASTY Bilateral 12/28/2016   Procedure: NASAL SEPTOPLASTY WITH  BILATARAL TURBINATE REDUCTION;  Surgeon: Newman Pies, MD;  Location: [facility] OR;   Service: ENT;  Laterality: Bilateral;  . resection of lymph node in neck    . ROTATOR CUFF REPAIR     right side  . SALIVARY GLAND SURGERY  11/25/2020  . SINUS ENDO WITH FUSION Bilateral 12/28/2016   Procedure: ENDOSCOPIC TOTAL ETHMOIDECTOMY,ENDOSCOPIC MAXILLARY ANTROSTOMY ;  Surgeon: Newman Pies, MD;  Location: [facility] OR;  Service: ENT;  Laterality: Bilateral;  . YAG LASER APPLICATION Bilateral 2018    There were no vitals filed for this visit.   Subjective Assessment - 02/08/21 1021    Subjective I am doing good. No pain.    Pertinent History Type 2 DM    Currently in Pain? No/denies    Multiple Pain Sites No                             [facility] Adult PT Treatment/Exercise - 02/08/21 0001      Neck Exercises: Seated   Lateral Flexion Both;10 reps    Other Seated Exercise Resisted cervical ext 3 sec hold 5x: isometric sidebending 3 sec hold 5x Bil      Neck Exercises: Supine   Cervical Rotation Both;20 reps;Other (comment)    Cervical Rotation Limitations 20x on small, soft ball, Vc to keep head heavy into  the ball      Shoulder Exercises: Supine   Protraction AROM;Strengthening;Both;10 reps;Weights    Theraband Level (Shoulder Protraction) Level 2 (Red)    Horizontal ABduction Strengthening;Both;10 reps;Theraband    Theraband Level (Shoulder Horizontal ABduction) Level 1 (Yellow)    Horizontal ABduction Limitations Yellow band was "strenuous"    Diagonals Strengthening;Both;5 reps    Theraband Level (Shoulder Diagonals) Level 1 (Yellow)      Shoulder Exercises: ROM/Strengthening   UBE (Upper Arm Bike) L1.5 3x3 with update on current status      Shoulder Exercises: Stretch   Other Shoulder Stretches Supine shoulder flexion overhead 10x                  PT Education - 02/08/21 1035    Education Details HEP    Person(s) Educated Patient    Methods Explanation;Demonstration;Verbal cues;Handout    Comprehension Returned demonstration;Verbalized  understanding            PT Short Term Goals - 02/03/21 1614      PT SHORT TERM GOAL #3   Title Patient will demonstrate 140 degrees bilateral shoulder flexion to more readily perform reaching ADLs.    Baseline 130 degrees bilaterally    Time 3    Period Weeks    Status On-going   135 bilaterally   Target Date 02/18/21             PT Long Term Goals - 01/28/21 1102      PT LONG TERM GOAL #1   Title Patient will be independent with advanced HEP for long term management of symptoms post D/C.    Time 6    Period Weeks    Status New    Target Date 03/12/21      PT LONG TERM GOAL #2   Title Patient will improved FOTO score to 58 to indicate improved overall function.    Baseline 47    Time 6    Period Weeks    Status New    Target Date 03/12/21      PT LONG TERM GOAL #3   Title Patient will perform five times sit to stand in 12s or less to indicate decreased fall risk.    Baseline 16a    Time 6    Period Weeks    Status New    Target Date 03/12/21      PT LONG TERM GOAL #4   Title Patient will lift 15# from floor level with good mechanics x10 reps to indicate improved functional activity tolerance.    Time 6    Period Weeks    Status New    Target Date 03/12/21                 Plan - 02/08/21 1022    Clinical Impression Statement Pt arrives pain free today for PT. Pt reports she is enjoying the "structure" of her HEP. Cervical iosmetrics were added to HEP today. Pt was able to perform supine postural strengthening exercises with yellow tband and light weights. Pt reports the yellow band felt "strenuous."    Personal Factors and Comorbidities Comorbidity 2    Comorbidities chronic migraine, type 2 DM    Examination-Activity Limitations Bend;Dressing;Hygiene/Grooming;Stand    Examination-Participation Restrictions Meal Prep;Cleaning    Stability/Clinical Decision Making Stable/Uncomplicated    Rehab Potential Good    PT Frequency 2x / week    PT  Duration 6 weeks    PT Treatment/Interventions ADLs/Self Care Home Management;Cryotherapy;Lobbyist  Stimulation;Iontophoresis 4mg /ml Dexamethasone;Moist Heat;Functional mobility training;Therapeutic activities;Therapeutic exercise;Balance training;Neuromuscular re-education;Patient/family education;Manual techniques;Passive range of motion;Dry needling;Taping;Spinal Manipulations;Joint Manipulations    PT Next Visit Plan Add the supine posture exercises to HEP with tband next 1-2 sessions.    PT Home Exercise Plan Access Code [email or web address]    Consulted and Agree with Plan of Care Patient           Patient will benefit from skilled therapeutic intervention in order to improve the following deficits and impairments:  Decreased balance,Decreased activity tolerance,Decreased endurance,Decreased range of motion,Decreased strength,Impaired perceived functional ability,Improper body mechanics,Postural dysfunction,Pain  Visit Diagnosis: Cervicalgia  Abnormal posture  Muscle weakness (generalized)  Stiffness of left shoulder, not elsewhere classified  Stiffness of right shoulder, not elsewhere classified  Other complicated headache syndrome     Problem List Patient Active Problem List   Diagnosis Date Noted  . Chronic migraine without aura, with intractable migraine, so stated, with status migrainosus 05/14/2019  . Cough variant asthma vs UACS 02/01/2018  . SBO (small bowel obstruction) (HCC) 10/06/2013  . DIABETES MELLITUS 05/29/2008  . GASTROPARESIS 05/29/2008  . COLONIC POLYPS, BENIGN, HX OF 05/29/2008    Fread Kottke, PTA 02/08/2021, 10:56 AM  [facility] [address] [place], [place], [place] Phone: [phone]   Fax:  [phone]  Name: CECIA EGGE MRN: Dicie Beam Date of Birth: Dec 25, 1950

## 2021-02-10 ENCOUNTER — Other Ambulatory Visit: Payer: Self-pay

## 2021-02-10 ENCOUNTER — Ambulatory Visit: Payer: Medicare Other | Admitting: Physical Therapy

## 2021-02-10 ENCOUNTER — Encounter: Payer: Self-pay | Admitting: Physical Therapy

## 2021-02-10 DIAGNOSIS — M6281 Muscle weakness (generalized): Secondary | ICD-10-CM | POA: Diagnosis not present

## 2021-02-10 DIAGNOSIS — G4459 Other complicated headache syndrome: Secondary | ICD-10-CM

## 2021-02-10 DIAGNOSIS — R293 Abnormal posture: Secondary | ICD-10-CM | POA: Diagnosis not present

## 2021-02-10 DIAGNOSIS — M25612 Stiffness of left shoulder, not elsewhere classified: Secondary | ICD-10-CM | POA: Diagnosis not present

## 2021-02-10 DIAGNOSIS — M25611 Stiffness of right shoulder, not elsewhere classified: Secondary | ICD-10-CM

## 2021-02-10 DIAGNOSIS — M542 Cervicalgia: Secondary | ICD-10-CM | POA: Diagnosis not present

## 2021-02-10 NOTE — Therapy (Signed)
[facility] [address] [place], [place], [address] Phone: [phone]   Fax:  [phone]  Physical Therapy Treatment  Patient Details  Name: Taylor Adams [facility] MRN: [id number] Date of Birth: 11/29/1950 Referring Provider (PT): Naomie Dean, MD   Encounter Date: 02/10/2021   PT End of Session - 02/10/21 1055    Visit Number 5    Date for PT Re-Evaluation 03/12/21    Authorization Type Medicare    Progress Note Due on Visit 10    PT Start Time 1055    PT Stop Time 1136    PT Time Calculation (min) 41 min    Activity Tolerance Patient tolerated treatment well    Behavior During Therapy [facility] for tasks assessed/performed           Past Medical History:  Diagnosis Date  . Anxiety   . Arthritis   . Benign colon polyp   . Cataracts, bilateral   . Depression   . Diabetes mellitus   . Diarrhea   . Gastroparesis   . Headache   . Intestinal bacterial overgrowth   . PONV (postoperative nausea and vomiting)    history of n/v  . SBO (small bowel obstruction) (HCC)     Past Surgical History:  Procedure Laterality Date  . ABDOMINAL HYSTERECTOMY    . APPENDECTOMY    . BREAST EXCISIONAL BIOPSY Bilateral    benign  . BREAST SURGERY     biopsy  . CATARACT EXTRACTION Bilateral 2017   Dr. Vonna Kotyk  . CATARACT EXTRACTION, BILATERAL    . EYE SURGERY    . ILEOCECETOMY  10/07/2013   Procedure: Ferne Coe;  Surgeon: Axel Filler, MD;  Location: [facility] OR;  Service: General;;  . LAPAROTOMY N/A 10/07/2013   Procedure: EXPLORATORY LAPAROTOMY;  Surgeon: Axel Filler, MD;  Location: [facility] OR;  Service: General;  Laterality: N/A;  . LYSIS OF ADHESION  10/07/2013   Procedure: LYSIS OF ADHESION;  Surgeon: Axel Filler, MD;  Location: [facility] OR;  Service: General;;  . NASAL SEPTOPLASTY W/ TURBINOPLASTY Bilateral 12/28/2016   Procedure: NASAL SEPTOPLASTY WITH  BILATARAL TURBINATE REDUCTION;  Surgeon: Newman Pies, MD;  Location: [facility] OR;   Service: ENT;  Laterality: Bilateral;  . resection of lymph node in neck    . ROTATOR CUFF REPAIR     right side  . SALIVARY GLAND SURGERY  11/25/2020  . SINUS ENDO WITH FUSION Bilateral 12/28/2016   Procedure: ENDOSCOPIC TOTAL ETHMOIDECTOMY,ENDOSCOPIC MAXILLARY ANTROSTOMY ;  Surgeon: Newman Pies, MD;  Location: [facility] OR;  Service: ENT;  Laterality: Bilateral;  . YAG LASER APPLICATION Bilateral 2018    There were no vitals filed for this visit.   Subjective Assessment - 02/10/21 1055    Subjective Those band exercises made me so sore. I am hurting so bad between my shoulder blades, it was throbbing last night. I almost didn't come today. i just want to hurt less.    Pertinent History Type 2 DM    Currently in Pain? Yes    Pain Score 8     Pain Location Scapula    Pain Orientation --   bewteen   Pain Descriptors / Indicators Sore    Aggravating Factors  The band exercises got me.    Pain Relieving Factors Not much since last session    Multiple Pain Sites No  [facility] Adult PT Treatment/Exercise - 02/10/21 0001      Shoulder Exercises: Stretch   Other Shoulder Stretches gentle rhomboid stretch 2x 3 breaths; gave this to pt for HEP today with instructions to stretch gently.      Moist Heat Therapy   Number Minutes Moist Heat 15 Minutes    Moist Heat Location --   Mid back     Electrical Stimulation   Electrical Stimulation Location mid scapula    Electrical Stimulation Action IFC    Electrical Stimulation Parameters 80-150 HZ in hooklying    Electrical Stimulation Goals Pain      Manual Therapy   Soft tissue mobilization Interscapula: a lot of spasm, very tender: pt did not tolerate much manual                    PT Short Term Goals - 02/03/21 1614      PT SHORT TERM GOAL #3   Title Patient will demonstrate 140 degrees bilateral shoulder flexion to more readily perform reaching ADLs.    Baseline 130 degrees bilaterally     Time 3    Period Weeks    Status On-going   135 bilaterally   Target Date 02/18/21             PT Long Term Goals - 01/28/21 1102      PT LONG TERM GOAL #1   Title Patient will be independent with advanced HEP for long term management of symptoms post D/C.    Time 6    Period Weeks    Status New    Target Date 03/12/21      PT LONG TERM GOAL #2   Title Patient will improved FOTO score to 58 to indicate improved overall function.    Baseline 47    Time 6    Period Weeks    Status New    Target Date 03/12/21      PT LONG TERM GOAL #3   Title Patient will perform five times sit to stand in 12s or less to indicate decreased fall risk.    Baseline 16a    Time 6    Period Weeks    Status New    Target Date 03/12/21      PT LONG TERM GOAL #4   Title Patient will lift 15# from floor level with good mechanics x10 reps to indicate improved functional activity tolerance.    Time 6    Period Weeks    Status New    Target Date 03/12/21                 Plan - 02/10/21 1108    Clinical Impression Statement Pt arrives with significant soreness of her interscapula muscles. She attributes this to the supine band exercises we did last session. Pt was mainly interested in pain/soreness relief today. Interscapular and paraspinals were in spasm and very tender to touch. Pt did not tolerate much manual work today. PTA suggested to pt she hold on her band exercises for a day or so but continue with her stretches gently. Also educate pt in use of heat and cold and to alternate between the two of them. Pt agreed to to this. Pt's pain was slightly improved but pt was reporting more "spasming" at the end of treatrment.    Personal Factors and Comorbidities Comorbidity 2    Comorbidities chronic migraine, type 2 DM    Examination-Activity Limitations Bend;Dressing;Hygiene/Grooming;Stand    Examination-Participation  Restrictions Meal Prep;Cleaning    Stability/Clinical Decision Making  Stable/Uncomplicated    Rehab Potential Good    PT Frequency 2x / week    PT Duration 6 weeks    PT Treatment/Interventions ADLs/Self Care Home Management;Cryotherapy;Electrical Stimulation;Iontophoresis 4mg /ml Dexamethasone;Moist Heat;Functional mobility training;Therapeutic activities;Therapeutic exercise;Balance training;Neuromuscular re-education;Patient/family education;Manual techniques;Passive range of motion;Dry needling;Taping;Spinal Manipulations;Joint Manipulations    PT Next Visit Plan Assess if soreness is better, continue with postural strength/endurance more level to avoid this level of sorenes?    PT Home Exercise Plan Access Code [email or web address]    Consulted and Agree with Plan of Care Patient           Patient will benefit from skilled therapeutic intervention in order to improve the following deficits and impairments:  Decreased balance,Decreased activity tolerance,Decreased endurance,Decreased range of motion,Decreased strength,Impaired perceived functional ability,Improper body mechanics,Postural dysfunction,Pain  Visit Diagnosis: Cervicalgia  Abnormal posture  Muscle weakness (generalized)  Stiffness of left shoulder, not elsewhere classified  Stiffness of right shoulder, not elsewhere classified  Other complicated headache syndrome     Problem List Patient Active Problem List   Diagnosis Date Noted  . Chronic migraine without aura, with intractable migraine, so stated, with status migrainosus 05/14/2019  . Cough variant asthma vs UACS 02/01/2018  . SBO (small bowel obstruction) (HCC) 10/06/2013  . DIABETES MELLITUS 05/29/2008  . GASTROPARESIS 05/29/2008  . COLONIC POLYPS, BENIGN, HX OF 05/29/2008    Naraya Stoneberg, PTA 02/10/2021, 1:55 PM  [facility] [address] [place], [place], [place] Phone: [phone]   Fax:  [phone]  Name: Taylor Adams MRN: Dicie Beam Date of Birth:  1951-09-27

## 2021-02-16 ENCOUNTER — Other Ambulatory Visit: Payer: Medicare Other

## 2021-02-16 ENCOUNTER — Encounter: Payer: Self-pay | Admitting: *Deleted

## 2021-02-16 ENCOUNTER — Encounter: Payer: Self-pay | Admitting: Gastroenterology

## 2021-02-16 ENCOUNTER — Ambulatory Visit (INDEPENDENT_AMBULATORY_CARE_PROVIDER_SITE_OTHER): Payer: Medicare Other | Admitting: Gastroenterology

## 2021-02-16 VITALS — BP 104/60 | HR 74 | Ht 62.0 in | Wt 113.0 lb

## 2021-02-16 DIAGNOSIS — R109 Unspecified abdominal pain: Secondary | ICD-10-CM | POA: Diagnosis not present

## 2021-02-16 DIAGNOSIS — K580 Irritable bowel syndrome with diarrhea: Secondary | ICD-10-CM | POA: Diagnosis not present

## 2021-02-16 DIAGNOSIS — R112 Nausea with vomiting, unspecified: Secondary | ICD-10-CM

## 2021-02-16 NOTE — Progress Notes (Signed)
Taylor Adams [facility]    [id number]    05/03/1951  Primary Care Physician:Tisovec, Adelfa Koh, MD  Referring Physician: Gaspar Garbe, MD [address] [place],  [place] [address]   Chief complaint:  Abdominal bloating, nausea, abdominal pain, vomiting  HPI: 70 year old very pleasant female here with complaints of nausea, vomiting, abdominal bloating with discomfort, excessive belching and worsening diarrhea in the past 2 to 3 months  Her symptoms had significantly improved after she was treated with Xifaxan in 2017 for small intestinal bacterial overgrowth and she has been relatively symptom-free for past few years until recent flare up.  She feels her's current symptoms are slightly different than what she was experiencing few years ago. On average she is having vomiting once every 1 to 2 weeks, can have multiple episodes.  She sometimes has bilious fluid and occasionally also vomits food/residual gastric contents. She is also experiencing worsening diarrhea with 3-4 bowel movements with semiformed to watery stool.  Denies any rectal bleeding.  No nocturnal diarrhea or fecal incontinence.  She is insulin-dependent diabetic, is currently on insulin pump overall her blood sugars are well controlled. Her symptoms started after she had salivary gland surgery in Feb 2022 at [facility] she has bilateral stents in her salivary ducts which she has to manipulate and clear almost daily.  GI Hx:  High-grade SBO in January 2015 , she underwent exploratory laparotomy with lysis of adhesions and ileocectomy.   In 2002 she was diagnosed with bacterial overgrowth on a breath Hydrogen test.  She has history of gastroparesis. She was on Reglan but it was discontinued in 2015 given no significant improvement  Patient had a colonoscopy in 2002 with removal of a tubular adenoma. A repeat colonoscopy in 2007 was normal. Recent colonoscopy in July 2017 was unremarkable as well except for sigmoid  diverticulosis .   Other past GI workup include  small bowel follow-through in 2005 was normal. An upper abdominal ultrasound in 2005 was negative. Her celiac profile was normal in 2005.   Outpatient Encounter Medications as of 02/16/2021  Medication Sig  . ALPRAZolam (XANAX) 0.5 MG tablet Take 1 tablet (0.5 mg total) by mouth 2 (two) times daily as needed for anxiety.  . B-D ULTRA-FINE 33 LANCETS MISC   . BAYER CONTOUR NEXT TEST test strip   . Biotin 5000 MCG TABS Take 5,000 mcg by mouth daily.   . budesonide-formoterol (SYMBICORT) 80-4.5 MCG/ACT inhaler Inhale 2 puffs into the lungs 2 (two) times daily.  . Cholecalciferol (VITAMIN D3) 1000 units CAPS Take 1,000 Units by mouth 2 (two) times daily.   . cyclobenzaprine (FLEXERIL) 10 MG tablet Take 10 mg by mouth as needed.  Marland Kitchen GLUCAGEN HYPOKIT 1 MG SOLR injection USE AS NEEDED FOR SEVERE HYPOGLYCEMIA  . hydrOXYzine (ATARAX/VISTARIL) 25 MG tablet Take 25 mg by mouth 3 (three) times daily as needed.  . hyoscyamine (LEVSIN SL) 0.125 MG SL tablet Place 0.125 mg under the tongue every 4 (four) hours as needed for cramping.   . Insulin Human (INSULIN PUMP) 100 unit/ml SOLN Inject into the skin continuous. Novolog 100 unit/mL Basal Rate: 12 am to 4:30 am  0.375 4:30 am to 8 am   0.45 8 am to 12 pm    0.525 12 pm to 3 pm  0.45 3 pm to 9 pm 0.475 9 pm to 12 am 0.425  . losartan (COZAAR) 25 MG tablet Take 50 mg by mouth daily.  Marland Kitchen  magnesium oxide (MAG-OX) 400 MG tablet Take 400 mg by mouth at bedtime.  . Multiple Vitamin (MULTIVITAMIN) tablet Take 1 tablet by mouth daily. Women's 50+  . oxyCODONE-acetaminophen (PERCOCET) 10-325 MG tablet Take prn up to bid for severe pain.  Marland Kitchen oxyCODONE-acetaminophen (PERCOCET) 10-325 MG tablet Take 1 tablet by mouth every 6 (six) hours as needed for pain.  . Probiotic Product (VSL#3) CAPS Take 1 capsule by mouth daily.  . Rimegepant Sulfate (NURTEC) 75 MG TBDP Take 75 mg by mouth daily as needed. For migraines. Take  as close to onset of migraine as possible. One daily maximum.  . Rimegepant Sulfate (NURTEC) 75 MG TBDP Take 75 mg by mouth daily as needed. For migraines. Take as close to onset of migraine as possible. One daily maximum.   No facility-administered encounter medications on file as of 02/16/2021.    Allergies as of 02/16/2021 - Review Complete 02/10/2021  Allergen Reaction Noted  . Codeine Itching 04/15/2016  . Latex Rash 04/15/2016    Past Medical History:  Diagnosis Date  . Anxiety   . Arthritis   . Benign colon polyp   . Cataracts, bilateral   . Depression   . Diabetes mellitus   . Diarrhea   . Gastroparesis   . Headache   . Intestinal bacterial overgrowth   . PONV (postoperative nausea and vomiting)    history of n/v  . SBO (small bowel obstruction) (HCC)     Past Surgical History:  Procedure Laterality Date  . ABDOMINAL HYSTERECTOMY    . APPENDECTOMY    . BREAST EXCISIONAL BIOPSY Bilateral    benign  . BREAST SURGERY     biopsy  . CATARACT EXTRACTION Bilateral 2017   Dr. Vonna Kotyk  . CATARACT EXTRACTION, BILATERAL    . EYE SURGERY    . ILEOCECETOMY  10/07/2013   Procedure: Ferne Coe;  Surgeon: Axel Filler, MD;  Location: [facility] OR;  Service: General;;  . LAPAROTOMY N/A 10/07/2013   Procedure: EXPLORATORY LAPAROTOMY;  Surgeon: Axel Filler, MD;  Location: [facility] OR;  Service: General;  Laterality: N/A;  . LYSIS OF ADHESION  10/07/2013   Procedure: LYSIS OF ADHESION;  Surgeon: Axel Filler, MD;  Location: [facility] OR;  Service: General;;  . NASAL SEPTOPLASTY W/ TURBINOPLASTY Bilateral 12/28/2016   Procedure: NASAL SEPTOPLASTY WITH  BILATARAL TURBINATE REDUCTION;  Surgeon: Newman Pies, MD;  Location: [facility] OR;  Service: ENT;  Laterality: Bilateral;  . resection of lymph node in neck    . ROTATOR CUFF REPAIR     right side  . SALIVARY GLAND SURGERY  11/25/2020  . SINUS ENDO WITH FUSION Bilateral 12/28/2016   Procedure: ENDOSCOPIC TOTAL ETHMOIDECTOMY,ENDOSCOPIC MAXILLARY ANTROSTOMY ;   Surgeon: Newman Pies, MD;  Location: [facility] OR;  Service: ENT;  Laterality: Bilateral;  . YAG LASER APPLICATION Bilateral 2018    Family History  Problem Relation Age of Onset  . Colon polyps Father   . Heart disease Father   . Diabetes Father   . Diabetes Mother   . Heart disease Mother   . Heart disease Brother   . Allergies Brother   . Allergies Sister   . Colon cancer Neg Hx   . Rectal cancer Neg Hx   . Stomach cancer Neg Hx   . Esophageal cancer Neg Hx     Social History   Socioeconomic History  . Marital status: Married    Spouse name: Not on file  . Number of children: Not on file  . Years of education:  Not on file  . Highest education level: Not on file  Occupational History  . Not on file  Tobacco Use  . Smoking status: Never Smoker  . Smokeless tobacco: Never Used  Vaping Use  . Vaping Use: Never used  Substance and Sexual Activity  . Alcohol use: Yes    Comment: glass of wine "every now and then"  . Drug use: No  . Sexual activity: Not on file  Other Topics Concern  . Not on file  Social History Narrative   Lives at home with her husband   Right handed   Caffeine: 2 cups per day   Social Determinants of Health   Financial Resource Strain: Not on file  Food Insecurity: Not on file  Transportation Needs: Not on file  Physical Activity: Not on file  Stress: Not on file  Social Connections: Not on file  Intimate Partner Violence: Not on file      Review of systems: All other review of systems negative except as mentioned in the HPI.   Physical Exam: Vitals:   02/16/21 1349  BP: 104/60  Pulse: 74   Body mass index is 20.67 kg/m. Gen:      No acute distress HEENT:  sclera anicteric Abd:      soft, non-tender; no palpable masses, no distension Ext:    No edema Neuro: alert and oriented x 3 Psych: normal mood and affect  Data Reviewed:  Reviewed labs, radiology imaging, old records and pertinent past GI work up   Assessment and  Plan/Recommendations:  70 year old female status post appendectomy and abdominal hysterectomy, high-grade SBO in January 2015 status post ex-lap, ileo cecectomy and lysis of adhesions, insulin-dependent diabetes here with complaints of nausea, vomiting, abdominal bloating, excessive belching and diarrhea Will obtain EGD to exclude peptic ulcer disease, severe gastritis or erosive esophagitis  If EGD unremarkable, will consider empiric therapy with 2-week course of Xifaxan 550 mg 3 times daily for small intestinal bacterial overgrowth  Will also need to exclude partial bowel obstruction or diabetes associated gastroparesis, will plan for further imaging after EGD  Diarrhea: Check GI pathogen panel, fecal lactoferrin to exclude acute inflammation/colitis Check fecal fat and pancreatic elastase to exclude pancreatic insufficiency If above work-up unrevealing and continues to have persistent diarrhea, will need to consider colonoscopy with biopsies to exclude microscopic colitis  The risks and benefits as well as alternatives of endoscopic procedure(s) have been discussed and reviewed. All questions answered. The patient agrees to proceed.    The patient was provided an opportunity to ask questions and all were answered. The patient agreed with the plan and demonstrated an understanding of the instructions.  Iona Beard , MD    CC: Tisovec, Adelfa Koh, MD

## 2021-02-16 NOTE — Patient Instructions (Signed)
You have been scheduled for an endoscopy. Please follow written instructions given to you at your visit today. If you use inhalers (even only as needed), please bring them with you on the day of your procedure.  Continue Insulin pump at 1/2 dose    Your provider has requested that you go to the basement level for lab work before leaving today. Press "B" on the elevator. The lab is located at the first door on the left as you exit the elevator.  Due to recent changes in healthcare laws, you may see the results of your imaging and laboratory studies on MyChart before your provider has had a chance to review them.  We understand that in some cases there may be results that are confusing or concerning to you. Not all laboratory results come back in the same time frame and the provider may be waiting for multiple results in order to interpret others.  Please give [place] 48 hours in order for your provider to thoroughly review all the results before contacting the office for clarification of your results.   Follow up after Endoscopy   I appreciate the  opportunity to care for you  Thank You   Marsa Aris , MD

## 2021-02-17 ENCOUNTER — Other Ambulatory Visit: Payer: Self-pay

## 2021-02-17 ENCOUNTER — Ambulatory Visit: Payer: Medicare Other | Admitting: Physical Therapy

## 2021-02-17 ENCOUNTER — Other Ambulatory Visit: Payer: Medicare Other

## 2021-02-17 DIAGNOSIS — R293 Abnormal posture: Secondary | ICD-10-CM

## 2021-02-17 DIAGNOSIS — R112 Nausea with vomiting, unspecified: Secondary | ICD-10-CM

## 2021-02-17 DIAGNOSIS — M6281 Muscle weakness (generalized): Secondary | ICD-10-CM

## 2021-02-17 DIAGNOSIS — K580 Irritable bowel syndrome with diarrhea: Secondary | ICD-10-CM | POA: Diagnosis not present

## 2021-02-17 DIAGNOSIS — M25611 Stiffness of right shoulder, not elsewhere classified: Secondary | ICD-10-CM

## 2021-02-17 DIAGNOSIS — M542 Cervicalgia: Secondary | ICD-10-CM | POA: Diagnosis not present

## 2021-02-17 DIAGNOSIS — G4459 Other complicated headache syndrome: Secondary | ICD-10-CM | POA: Diagnosis not present

## 2021-02-17 DIAGNOSIS — R109 Unspecified abdominal pain: Secondary | ICD-10-CM

## 2021-02-17 DIAGNOSIS — M25612 Stiffness of left shoulder, not elsewhere classified: Secondary | ICD-10-CM | POA: Diagnosis not present

## 2021-02-17 NOTE — Therapy (Addendum)
[facility] [address], [place], [place], [address] Phone: [phone]   Fax:  [phone]  Physical Therapy Treatment  Patient Details  Name: Taylor Adams MRN: [id number] Date of Birth: 05/13/1951 Referring Provider (PT): Sarina Ill, MD   Encounter Date: 02/17/2021   PT End of Session - 02/17/21 1404    Visit Number 6    Date for PT Re-Evaluation 03/12/21    Authorization Type Medicare    Progress Note Due on Visit 10    PT Start Time 1017    PT Stop Time 1100    PT Time Calculation (min) 43 min    Activity Tolerance Patient tolerated treatment well;Patient limited by pain    Behavior During Therapy [facility] for tasks assessed/performed           Past Medical History:  Diagnosis Date  . Anxiety   . Arthritis   . Benign colon polyp   . Cataracts, bilateral   . Depression   . Diabetes mellitus   . Diarrhea   . Gastroparesis   . Headache   . Intestinal bacterial overgrowth   . PONV (postoperative nausea and vomiting)    history of n/v  . SBO (small bowel obstruction) (HCC)     Past Surgical History:  Procedure Laterality Date  . ABDOMINAL HYSTERECTOMY    . APPENDECTOMY    . BREAST EXCISIONAL BIOPSY Bilateral    benign  . BREAST SURGERY     biopsy  . CATARACT EXTRACTION Bilateral 2017   Dr. Talbert Forest  . CATARACT EXTRACTION, BILATERAL    . EYE SURGERY    . ILEOCECETOMY  10/07/2013   Procedure: Pennie Rushing;  Surgeon: Ralene Ok, MD;  Location: [place];  Service: General;;  . LAPAROTOMY N/A 10/07/2013   Procedure: EXPLORATORY LAPAROTOMY;  Surgeon: Ralene Ok, MD;  Location: [place];  Service: General;  Laterality: N/A;  . LYSIS OF ADHESION  10/07/2013   Procedure: LYSIS OF ADHESION;  Surgeon: Ralene Ok, MD;  Location: [place];  Service: General;;  . NASAL SEPTOPLASTY W/ TURBINOPLASTY Bilateral 12/28/2016   Procedure: NASAL SEPTOPLASTY WITH  BILATARAL TURBINATE REDUCTION;  Surgeon: Leta Baptist, MD;   Location: [facility] OR;  Service: ENT;  Laterality: Bilateral;  . resection of lymph node in neck    . ROTATOR CUFF REPAIR     right side  . SALIVARY GLAND SURGERY  11/25/2020  . SINUS ENDO WITH FUSION Bilateral 12/28/2016   Procedure: ENDOSCOPIC TOTAL ETHMOIDECTOMY,ENDOSCOPIC MAXILLARY ANTROSTOMY ;  Surgeon: Leta Baptist, MD;  Location: [facility] OR;  Service: ENT;  Laterality: Bilateral;  . YAG LASER APPLICATION Bilateral 3295    There were no vitals filed for this visit.   Subjective Assessment - 02/17/21 1022    Subjective Continues to have increased pain. Reports pain is now Lt side thoracic, inferior to scapula and woke her from her sleep last night. States that pain between shoulder blades has since resolved.    Pertinent History Type 2 DM    How long can you sit comfortably? unlimited    How long can you stand comfortably? 20 minutes    How long can you walk comfortably? 35 minutes    Patient Stated Goals to reintroduce exercise to daily life; to touch toes    Currently in Pain? Yes    Pain Score 5     Pain Location Thoracic    Pain Orientation Left    Pain Descriptors / Indicators Sore;Throbbing    Pain Type  Acute pain;Chronic pain    Pain Onset 1 to 4 weeks ago    Pain Frequency Constant                             [facility] Adult PT Treatment/Exercise - 02/17/21 0001      Neck Exercises: Machines for Strengthening   UBE (Upper Arm Bike) Level 1; x4 minutes backwards      Neck Exercises: Standing   Other Standing Exercises lumbar flexion stretch for lat stretch at high table; 3 x 10 sec hold      Neck Exercises: Seated   Other Seated Exercise rhomboid stretch; 3 x 10 sec hold      Moist Heat Therapy   Number Minutes Moist Heat 20 Minutes    Moist Heat Location --   thoracic with IFC     Electrical Stimulation   Electrical Stimulation Location Lt side medial thoracic    Electrical Stimulation Action IFC    Electrical Stimulation Parameters 80-100 Hz    Electrical  Stimulation Goals Pain                    PT Short Term Goals - 02/03/21 1614      PT SHORT TERM GOAL #3   Title Patient will demonstrate 140 degrees bilateral shoulder flexion to more readily perform reaching ADLs.    Baseline 130 degrees bilaterally    Time 3    Period Weeks    Status On-going   135 bilaterally   Target Date 02/18/21             PT Long Term Goals - 01/28/21 1102      PT LONG TERM GOAL #1   Title Patient will be independent with advanced HEP for long term management of symptoms post D/C.    Time 6    Period Weeks    Status New    Target Date 03/12/21      PT LONG TERM GOAL #2   Title Patient will improved FOTO score to 58 to indicate improved overall function.    Baseline 47    Time 6    Period Weeks    Status New    Target Date 03/12/21      PT LONG TERM GOAL #3   Title Patient will perform five times sit to stand in 12s or less to indicate decreased fall risk.    Baseline 16a    Time 6    Period Weeks    Status New    Target Date 03/12/21      PT LONG TERM GOAL #4   Title Patient will lift 15# from floor level with good mechanics x10 reps to indicate improved functional activity tolerance.    Time 6    Period Weeks    Status New    Target Date 03/12/21                 Plan - 02/17/21 1025    Clinical Impression Statement Patient arrives with continues increased soreness located inferior and medial to Lt scapula. Reports increased pain with attempted overhead activity. Patient reports mild relief with latissimus and middle trap stretches. Would benefit from continued skilled intervention for improved activity tolerance and to more readily complete ADLs.    Personal Factors and Comorbidities Comorbidity 2    Comorbidities chronic migraine, type 2 DM    Examination-Activity Limitations [place];Dressing;Hygiene/Grooming;Stand    Examination-Participation Restrictions  Meal Prep;Cleaning    Rehab Potential Good    PT Frequency  2x / week    PT Duration 6 weeks    PT Treatment/Interventions ADLs/Self Care Home Management;Cryotherapy;Electrical Stimulation;Iontophoresis 34m/ml Dexamethasone;Moist Heat;Functional mobility training;Therapeutic activities;Therapeutic exercise;Balance training;Neuromuscular re-education;Patient/family education;Manual techniques;Passive range of motion;Dry needling;Taping;Spinal Manipulations;Joint Manipulations    PT Next Visit Plan Assess if soreness is better, continue with postural strength/endurance    PT Home Exercise Plan Access Code [email or web address]    Consulted and Agree with Plan of Care Patient           Patient will benefit from skilled therapeutic intervention in order to improve the following deficits and impairments:  Decreased balance,Decreased activity tolerance,Decreased endurance,Decreased range of motion,Decreased strength,Impaired perceived functional ability,Improper body mechanics,Postural dysfunction,Pain  Visit Diagnosis: Cervicalgia  Abnormal posture  Muscle weakness (generalized)  Stiffness of left shoulder, not elsewhere classified  Stiffness of right shoulder, not elsewhere classified     Problem List Patient Active Problem List   Diagnosis Date Noted  . Chronic migraine without aura, with intractable migraine, so stated, with status migrainosus 05/14/2019  . Cough variant asthma vs UACS 02/01/2018  . SBO (small bowel obstruction) ([place] 10/06/2013  . DIABETES MELLITUS 05/29/2008  . GASTROPARESIS 05/29/2008  . COLONIC POLYPS, BENIGN, HX OF 05/29/2008   KEverardo AllPT, DPT  02/17/21 2:05 PM    [facility] [address] [place] [address]: [phone]  Fax:  [phone] Name: Taylor Adams PHYSICAL THERAPY DISCHARGE SUMMARY  Visits from Start of Care: 6  Current functional level related to goals / functional  outcomes: See above    Remaining deficits: See above   Education / Equipment: See above  Plan: Patient agrees to discharge.  Patient goals were not met. Patient is being discharged due to the patient's request.  ?????

## 2021-02-19 LAB — TIQ-NTM

## 2021-02-20 LAB — GI PROFILE, STOOL, PCR

## 2021-02-22 ENCOUNTER — Other Ambulatory Visit: Payer: Self-pay

## 2021-02-22 ENCOUNTER — Encounter: Payer: Medicare Other | Admitting: Physical Therapy

## 2021-02-22 DIAGNOSIS — K580 Irritable bowel syndrome with diarrhea: Secondary | ICD-10-CM

## 2021-02-22 MED ORDER — RIFAXIMIN 550 MG PO TABS: 550.0000 mg | ORAL_TABLET | Freq: Three times a day (TID) | ORAL | 0 refills | Status: AC

## 2021-02-22 NOTE — Progress Notes (Addendum)
02/23/2021 ALL: She continues to do very well on Botox therapy. She continues Nurtec for abortive therapy and rarely takes oxycodone. Migraines are well managed until last 2 weeks. She feels masseter injections are helpful for clenching. She requests we administer orbicularis orbi injections for retro orbital headaches.    11/18/2020 ALL: Botox continues to be effective in migraine management. She feels Nurtec works well for abortive therapy. She does have a small amount of oxycodone she uses for a rare emergency. Last rx was for 60 tablets on 08/2019. She does take tramadol 50mg  BID prescribed by PCP. PDMP reviewed and shows appropriate refills.  She notes worsening of migraines the week or two before next botox procedure. She is working with for dry mouth related to Sjogrens. She is planning to have stents placed next month. Considering Botox therapy for dry mouth. She continues to note significant benefit of masseter injections for TMJ.    08/18/2020 ALL: Taylor Adams is doing well, today. She has had more headaches over the past three months than she normally does. No obvious triggers. Nurtec works well for abortive therapy. She does clentch teeth leading to headaches.   Consent Form Botulism Toxin Injection For Chronic Migraine    Reviewed orally with patient, additionally signature is on file:  Botulism toxin has been approved by the Federal drug administration for treatment of chronic migraine. Botulism toxin does not cure chronic migraine and it may not be effective in some patients.  The administration of botulism toxin is accomplished by injecting a small amount of toxin into the muscles of the neck and head. Dosage must be titrated for each individual. Any benefits resulting from botulism toxin tend to wear off after 3 months with a repeat injection required if benefit is to be maintained. Injections are usually done every 3-4 months with maximum effect peak achieved by about 2 or  3 weeks. Botulism toxin is expensive and you should be sure of what costs you will incur resulting from the injection.  The side effects of botulism toxin use for chronic migraine may include:   -Transient, and usually mild, facial weakness with facial injections  -Transient, and usually mild, head or neck weakness with head/neck injections  -Reduction or loss of forehead facial animation due to forehead muscle weakness  -Eyelid drooping  -Dry eye  -Pain at the site of injection or bruising at the site of injection  -Double vision  -Potential unknown long term risks   Contraindications: You should not have Botox if you are pregnant, nursing, allergic to albumin, have an infection, skin condition, or muscle weakness at the site of the injection, or have myasthenia gravis, Lambert-Eaton syndrome, or ALS.  It is also possible that as with any injection, there may be an allergic reaction or no effect from the medication. Reduced effectiveness after repeated injections is sometimes seen and rarely infection at the injection site may occur. All care will be taken to prevent these side effects. If therapy is given over a long time, atrophy and wasting in the muscle injected may occur. Occasionally the patient's become refractory to treatment because they develop antibodies to the toxin. In this event, therapy needs to be modified.  I have read the above information and consent to the administration of botulism toxin.    BOTOX PROCEDURE NOTE FOR MIGRAINE HEADACHE  Contraindications and precautions discussed with patient(above). Aseptic procedure was observed and patient tolerated procedure. Procedure performed by Burna Mortimer, FNP-C.   The condition has existed  for more than 6 months, and pt does not have a diagnosis of ALS, Myasthenia Gravis or Lambert-Eaton Syndrome.  Risks and benefits of injections discussed and pt agrees to proceed with the procedure.  Written consent obtained  These injections  are medically necessary. Pt  receives good benefits from these injections. These injections do not cause sedations or hallucinations which the oral therapies may cause.   Description of procedure:  The patient was placed in a sitting position. The standard protocol was used for Botox as follows, with 5 units of Botox injected at each site:  -Procerus muscle, midline injection  -Corrugator muscle, bilateral injection  -Frontalis muscle, bilateral injection, with 2 sites each side, medial injection was performed in the upper one third of the frontalis muscle, in the region vertical from the medial inferior edge of the superior orbital rim. The lateral injection was again in the upper one third of the forehead vertically above the lateral limbus of the cornea, 1.5 cm lateral to the medial injection site.  -Temporalis muscle injection, 4 sites, bilaterally. The first injection was 3 cm above the tragus of the ear, second injection site was 1.5 cm to 3 cm up from the first injection site in line with the tragus of the ear. The third injection site was 1.5-3 cm forward between the first 2 injection sites. The fourth injection site was 1.5 cm posterior to the second injection site. 5th site laterally in the temporalis  muscleat the level of the outer canthus.  -Occipitalis muscle injection, 3 sites, bilaterally. The first injection was done one half way between the occipital protuberance and the tip of the mastoid process behind the ear. The second injection site was done lateral and superior to the first, 1 fingerbreadth from the first injection. The third injection site was 1 fingerbreadth superiorly and medially from the first injection site.  -Cervical paraspinal muscle injection, 2 sites, bilaterally. The first injection site was 1 cm from the midline of the cervical spine, 3 cm inferior to the lower border of the occipital protuberance. The second injection site was 1.5 cm superiorly and laterally to  the first injection site.  -Trapezius muscle injection was performed at 3 sites, bilaterally. The first injection site was in the upper trapezius muscle halfway between the inflection point of the neck, and the acromion. The second injection site was one half way between the acromion and the first injection site. The third injection was done between the first injection site and the inflection point of the neck.  -Masseter 10 units bilaterally   -bilateral orbicularis orbi injections at outer rim 3units   Will return for repeat injection in 3 months.   A total of 200 units of Botox was prepared, 181 units of Botox was injected as documented above, any Botox not injected was wasted. The patient tolerated the procedure well, there were no complications of the above procedure.  agree with assessment and plan as stated.     Naomie Dean, MD [facility]

## 2021-02-23 ENCOUNTER — Encounter: Payer: Self-pay | Admitting: Family Medicine

## 2021-02-23 ENCOUNTER — Ambulatory Visit (INDEPENDENT_AMBULATORY_CARE_PROVIDER_SITE_OTHER): Payer: Medicare Other | Admitting: Family Medicine

## 2021-02-23 ENCOUNTER — Other Ambulatory Visit: Payer: Self-pay

## 2021-02-23 DIAGNOSIS — G43711 Chronic migraine without aura, intractable, with status migrainosus: Secondary | ICD-10-CM | POA: Diagnosis not present

## 2021-02-23 NOTE — Progress Notes (Signed)
Botox- 200 units x 1 vial Lot: [id number] Expiration: 10/2023 NDC: 0023-3921-02  Bacteriostatic 0.9% Sodium Chloride- 4mL total Lot: [id number] Expiration: 03/03/2022 NDC: 0409-1966-02  Dx: G43.709 B/B  

## 2021-02-24 ENCOUNTER — Encounter: Payer: Medicare Other | Admitting: Physical Therapy

## 2021-03-02 ENCOUNTER — Encounter: Payer: Medicare Other | Admitting: Physical Therapy

## 2021-03-03 LAB — FECAL LACTOFERRIN, QUANT
Fecal Lactoferrin: NEGATIVE
MICRO NUMBER:: 11905842
SPECIMEN QUALITY:: ADEQUATE

## 2021-03-03 LAB — FECAL FAT, QUANTITATIVE
Fat Quant, Stl Collec: 72 h
Specimen Total Weight: 27 g

## 2021-03-03 LAB — PANCREATIC ELASTASE, FECAL: Pancreatic Elastase-1, Stool: 450 mcg/g

## 2021-03-03 LAB — FECAL FAT, QUALITATIVE: FECAL FAT, QUALITATIVE: ABNORMAL — AB

## 2021-03-04 ENCOUNTER — Encounter: Payer: Medicare Other | Admitting: Physical Therapy

## 2021-03-08 ENCOUNTER — Encounter: Payer: Medicare Other | Admitting: Physical Therapy

## 2021-03-10 ENCOUNTER — Encounter: Payer: Medicare Other | Admitting: Physical Therapy

## 2021-03-19 DIAGNOSIS — G43711 Chronic migraine without aura, intractable, with status migrainosus: Secondary | ICD-10-CM | POA: Diagnosis not present

## 2021-03-19 DIAGNOSIS — E103293 Type 1 diabetes mellitus with mild nonproliferative diabetic retinopathy without macular edema, bilateral: Secondary | ICD-10-CM | POA: Diagnosis not present

## 2021-03-19 DIAGNOSIS — F419 Anxiety disorder, unspecified: Secondary | ICD-10-CM | POA: Diagnosis not present

## 2021-03-19 DIAGNOSIS — M810 Age-related osteoporosis without current pathological fracture: Secondary | ICD-10-CM | POA: Diagnosis not present

## 2021-03-19 DIAGNOSIS — E1043 Type 1 diabetes mellitus with diabetic autonomic (poly)neuropathy: Secondary | ICD-10-CM | POA: Diagnosis not present

## 2021-03-19 DIAGNOSIS — I10 Essential (primary) hypertension: Secondary | ICD-10-CM | POA: Diagnosis not present

## 2021-03-19 DIAGNOSIS — J45991 Cough variant asthma: Secondary | ICD-10-CM | POA: Diagnosis not present

## 2021-03-19 DIAGNOSIS — G629 Polyneuropathy, unspecified: Secondary | ICD-10-CM | POA: Diagnosis not present

## 2021-03-19 DIAGNOSIS — Z4681 Encounter for fitting and adjustment of insulin pump: Secondary | ICD-10-CM | POA: Diagnosis not present

## 2021-03-19 DIAGNOSIS — Z794 Long term (current) use of insulin: Secondary | ICD-10-CM | POA: Diagnosis not present

## 2021-03-19 DIAGNOSIS — E1040 Type 1 diabetes mellitus with diabetic neuropathy, unspecified: Secondary | ICD-10-CM | POA: Diagnosis not present

## 2021-03-19 DIAGNOSIS — M5030 Other cervical disc degeneration, unspecified cervical region: Secondary | ICD-10-CM | POA: Diagnosis not present

## 2021-04-26 ENCOUNTER — Ambulatory Visit (AMBULATORY_SURGERY_CENTER): Payer: Medicare Other | Admitting: Gastroenterology

## 2021-04-26 ENCOUNTER — Encounter: Payer: Self-pay | Admitting: Gastroenterology

## 2021-04-26 ENCOUNTER — Other Ambulatory Visit: Payer: Self-pay

## 2021-04-26 VITALS — BP 141/68 | HR 71 | Temp 98.9°F | Resp 17 | Ht 62.0 in | Wt 113.0 lb

## 2021-04-26 DIAGNOSIS — R112 Nausea with vomiting, unspecified: Secondary | ICD-10-CM

## 2021-04-26 DIAGNOSIS — R109 Unspecified abdominal pain: Secondary | ICD-10-CM

## 2021-04-26 MED ORDER — SODIUM CHLORIDE 0.9 % IV SOLN: 500.0000 mL | Freq: Once | INTRAVENOUS | Status: DC

## 2021-04-26 MED ORDER — PANTOPRAZOLE SODIUM 40 MG PO TBEC: 40.0000 mg | DELAYED_RELEASE_TABLET | Freq: Every day | ORAL | 0 refills | Status: DC

## 2021-04-26 NOTE — Progress Notes (Signed)
Called to room to assist during endoscopic procedure.  Patient ID and intended procedure confirmed with present staff. Received instructions for my participation in the procedure from the performing physician.  

## 2021-04-26 NOTE — Progress Notes (Signed)
History reviewed with no changes noted. VS assessed by C.W 

## 2021-04-26 NOTE — Patient Instructions (Signed)
New Rx for protonix, call office soon to schedule follow up appointment for 2 months.  YOU HAD AN ENDOSCOPIC PROCEDURE TODAY AT THE [facility]:   Refer to the procedure report that was given to you for any specific questions about what was found during the examination.  If the procedure report does not answer your questions, please call your gastroenterologist to clarify.  If you requested that your care partner not be given the details of your procedure findings, then the procedure report has been included in a sealed envelope for you to review at your convenience later.  YOU SHOULD EXPECT: Some feelings of bloating in the abdomen. Passage of more gas than usual.  Walking can help get rid of the air that was put into your GI tract during the procedure and reduce the bloating. If you had a lower endoscopy (such as a colonoscopy or flexible sigmoidoscopy) you may notice spotting of blood in your stool or on the toilet paper. If you underwent a bowel prep for your procedure, you may not have a normal bowel movement for a few days.  Please Note:  You might notice some irritation and congestion in your nose or some drainage.  This is from the oxygen used during your procedure.  There is no need for concern and it should clear up in a day or so.  SYMPTOMS TO REPORT IMMEDIATELY:  Following upper endoscopy (EGD)  Vomiting of blood or coffee ground material  New chest pain or pain under the shoulder blades  Painful or persistently difficult swallowing  New shortness of breath  Fever of 100F or higher  Black, tarry-looking stools  For urgent or emergent issues, a gastroenterologist can be reached at any hour by calling [phone]. Do not use MyChart messaging for urgent concerns.    DIET:  We do recommend a small meal at first, but then you may proceed to your regular diet.  Drink plenty of fluids but you should avoid alcoholic beverages for 24 hours.  ACTIVITY:  You should plan to  take it easy for the rest of today and you should NOT DRIVE or use heavy machinery until tomorrow (because of the sedation medicines used during the test).    FOLLOW UP: Our staff will call the number listed on your records 48-72 hours following your procedure to check on you and address any questions or concerns that you may have regarding the information given to you following your procedure. If we do not reach you, we will leave a message.  We will attempt to reach you two times.  During this call, we will ask if you have developed any symptoms of COVID 19. If you develop any symptoms (ie: fever, flu-like symptoms, shortness of breath, cough etc.) before then, please call [phone].  If you test positive for Covid 19 in the 2 weeks post procedure, please call and report this information to [place].    If any biopsies were taken you will be contacted by phone or by letter within the next 1-3 weeks.  Please call us at [phone] if you have not heard about the biopsies in 3 weeks.    SIGNATURES/CONFIDENTIALITY: You and/or your care partner have signed paperwork which will be entered into your electronic medical record.  These signatures attest to the fact that that the information above on your After Visit Summary has been reviewed and is understood.  Full responsibility of the confidentiality of this discharge information lies with you and/or your  care-partner.  

## 2021-04-26 NOTE — Op Note (Signed)
[facility] Patient Name: Taylor Adams Procedure Date: 04/26/2021 10:13 AM MRN: [id number] Endoscopist: Napoleon FormKavitha V. Lorraina Spring , MD Age: 70 Referring MD:  Date of Birth: Oct 15, 1950 Gender: Female Account #: [id number] Procedure:                Upper GI endoscopy Indications:              Persistent vomiting of unknown cause, Epigastric                            abdominal pain, Nausea with vomiting Medicines:                Monitored Anesthesia Care Procedure:                Pre-Anesthesia Assessment:                           - Prior to the procedure, a History and Physical                            was performed, and patient medications and                            allergies were reviewed. The patient's tolerance of                            previous anesthesia was also reviewed. The risks                            and benefits of the procedure and the sedation                            options and risks were discussed with the patient.                            All questions were answered, and informed consent                            was obtained. Prior Anticoagulants: The patient has                            taken no previous anticoagulant or antiplatelet                            agents. ASA Grade Assessment: II - A patient with                            mild systemic disease. After reviewing the risks                            and benefits, the patient was deemed in                            satisfactory condition to undergo the procedure.  After obtaining informed consent, the endoscope was                            passed under direct vision. Throughout the                            procedure, the patient's blood pressure, pulse, and                            oxygen saturations were monitored continuously. The                            Endoscope was introduced through the mouth, and                            advanced to the  second part of duodenum. The upper                            GI endoscopy was accomplished without difficulty.                            The patient tolerated the procedure well. Scope In: Scope Out: Findings:                 The Z-line was regular and was found 35 cm from the                            incisors.                           The examined esophagus was normal.                           The stomach was normal.                           The cardia and gastric fundus were normal on                            retroflexion.                           A few erosions without bleeding were found in the                            duodenal bulb. Biopsies were taken with a cold                            forceps for histology. Biopsies for histology were                            taken with a cold forceps for evaluation of celiac                            disease.  The gastroesophageal flap valve was visualized                            endoscopically and classified as Hill Grade II                            (fold present, opens with respiration). Complications:            No immediate complications. Estimated Blood Loss:     Estimated blood loss was minimal. Impression:               - Z-line regular, 35 cm from the incisors.                           - Normal esophagus.                           - Normal stomach.                           - Duodenal erosions without bleeding. Biopsied. Recommendation:           - Patient has a contact number available for                            emergencies. The signs and symptoms of potential                            delayed complications were discussed with the                            patient. Return to normal activities tomorrow.                            Written discharge instructions were provided to the                            patient.                           - Resume previous diet.                            - Continue present medications.                           - Await pathology results.                           - Follow an antireflux regimen.                           - Use Protonix (pantoprazole) 40 mg PO daily for 3                            months.                           -  Return to my office in 2 months. Please call to                            schedule appointment Napoleon Form, MD 04/26/2021 10:49:01 AM This report has been signed electronically.

## 2021-04-26 NOTE — Progress Notes (Signed)
PT taken to PACU. Monitors in place. VSS. Report given to RN. 

## 2021-04-28 ENCOUNTER — Telehealth: Payer: Self-pay

## 2021-04-28 NOTE — Telephone Encounter (Addendum)
Patient said she was woken up from sleep with chest pressure/discomfort last night and not on the night of the procedure which was 7/25. She felt it was different than her usual heartburn, it eventually subsided with Tylenol after about an hour.  Did not have any chest pain, heartburn or regurgitation after the procedure on 7/25 until last night. No esophageal biopsies were obtained and did not have any esophageal dilation either.  Her symptoms seem atypical for GERD  Based on history concerning for possible angina, advised patient to call Dr. Deneen Harts office.  She will need further evaluation to exclude cardiac related chest pain/angina. Advised patient to come to the ER if she experiences chest pain again or exertional dyspnea Patient was very appreciative for the phone call and understood the instructions.  Beth, can you please call Dr. Deneen Harts office and let them be aware.  Thank you

## 2021-04-28 NOTE — Telephone Encounter (Signed)
Called to speak with the assistant to Dr Wylene Simmer, Tresa Endo. Left her as much detail as I could before the voicemail cut me off. Faxed the note to her attention.

## 2021-04-28 NOTE — Telephone Encounter (Signed)
  Follow up Call-  Call back number 04/26/2021  Post procedure Call Back phone  # [phone]  Permission to leave phone message Yes  Some recent data might be hidden     Patient questions:  Do you have a fever, pain , or abdominal swelling? No. Pain Score  0 *  Have you tolerated food without any problems? Yes.    Have you been able to return to your normal activities? Yes.    Do you have any questions about your discharge instructions: Diet   No. Medications  No. Follow up visit  No.  Do you have questions or concerns about your Care? Yes.    Patient states that she started taking her Protonix the evening of her procedure. States she was awoken from sleep in the middle of the night with what she describes as heartburn pain 8/10, "the worst she's ever had". She sat up, used a heating pad on her chest, and the heartburn was relieved, back to normal eating and activity with no further incidence at this time. Told patient I would route this message to Dr. Lavon Paganini for further advisement and for her to call us immediately if she has any further problems. Patient states she will do so.  Actions: * If pain score is 4 or above: No action needed, pain <4. Have you developed a fever since your procedure? no  2.   Have you had an respiratory symptoms (SOB or cough) since your procedure? no  3.   Have you tested positive for COVID 19 since your procedure no  4.   Have you had any family members/close contacts diagnosed with the COVID 19 since your procedure?  no   If yes to any of these questions please route to Laverna Peace, RN and Karlton Lemon, RN

## 2021-04-29 DIAGNOSIS — R110 Nausea: Secondary | ICD-10-CM | POA: Diagnosis not present

## 2021-04-29 DIAGNOSIS — K224 Dyskinesia of esophagus: Secondary | ICD-10-CM | POA: Diagnosis not present

## 2021-04-29 DIAGNOSIS — K219 Gastro-esophageal reflux disease without esophagitis: Secondary | ICD-10-CM | POA: Diagnosis not present

## 2021-04-29 DIAGNOSIS — R053 Chronic cough: Secondary | ICD-10-CM | POA: Diagnosis not present

## 2021-04-29 DIAGNOSIS — R1013 Epigastric pain: Secondary | ICD-10-CM | POA: Diagnosis not present

## 2021-05-06 ENCOUNTER — Encounter: Payer: Medicare Other | Admitting: Gastroenterology

## 2021-05-10 ENCOUNTER — Encounter: Payer: Self-pay | Admitting: Gastroenterology

## 2021-05-24 DIAGNOSIS — L820 Inflamed seborrheic keratosis: Secondary | ICD-10-CM | POA: Diagnosis not present

## 2021-05-24 DIAGNOSIS — L570 Actinic keratosis: Secondary | ICD-10-CM | POA: Diagnosis not present

## 2021-05-26 ENCOUNTER — Ambulatory Visit: Payer: Medicare Other | Admitting: Gastroenterology

## 2021-05-26 NOTE — Progress Notes (Signed)
05/27/2021 ALL: She continues Botox. Nurtec usually helps with abortive therapy. She felt this past 12 weeks was great until the last 3 weeks. She had several really bad headaches not responsive to Nurtec. Rarely needs oxycodone but took three over the past three weeks. Masseter and orbicularis injections have been helpful.   02/23/2021 ALL: She continues to do very well on Botox therapy. She continues Nurtec for abortive therapy and rarely takes oxycodone. Migraines are well managed until last 2 weeks. She feels masseter injections are helpful for clenching. She requests we administer orbicularis orbi injections for retro orbital headaches.   11/18/2020 ALL: Botox continues to be effective in migraine management. She feels Nurtec works well for abortive therapy. She does have a small amount of oxycodone she uses for a rare emergency. Last rx was for 60 tablets on 08/2019. She does take tramadol 50mg  BID prescribed by PCP. PDMP reviewed and shows appropriate refills.  She notes worsening of migraines the week or two before next botox procedure. She is working with for dry mouth related to Sjogrens. She is planning to have stents placed next month. Considering Botox therapy for dry mouth. She continues to note significant benefit of masseter injections for TMJ.    08/18/2020 ALL: Javonda is doing well, today. She has had more headaches over the past three months than she normally does. No obvious triggers. Nurtec works well for abortive therapy. She does clentch teeth leading to headaches.   Consent Form Botulism Toxin Injection For Chronic Migraine    Reviewed orally with patient, additionally signature is on file:  Botulism toxin has been approved by the Federal drug administration for treatment of chronic migraine. Botulism toxin does not cure chronic migraine and it may not be effective in some patients.  The administration of botulism toxin is accomplished by injecting a small  amount of toxin into the muscles of the neck and head. Dosage must be titrated for each individual. Any benefits resulting from botulism toxin tend to wear off after 3 months with a repeat injection required if benefit is to be maintained. Injections are usually done every 3-4 months with maximum effect peak achieved by about 2 or 3 weeks. Botulism toxin is expensive and you should be sure of what costs you will incur resulting from the injection.  The side effects of botulism toxin use for chronic migraine may include:   -Transient, and usually mild, facial weakness with facial injections  -Transient, and usually mild, head or neck weakness with head/neck injections  -Reduction or loss of forehead facial animation due to forehead muscle weakness  -Eyelid drooping  -Dry eye  -Pain at the site of injection or bruising at the site of injection  -Double vision  -Potential unknown long term risks   Contraindications: You should not have Botox if you are pregnant, nursing, allergic to albumin, have an infection, skin condition, or muscle weakness at the site of the injection, or have myasthenia gravis, Lambert-Eaton syndrome, or ALS.  It is also possible that as with any injection, there may be an allergic reaction or no effect from the medication. Reduced effectiveness after repeated injections is sometimes seen and rarely infection at the injection site may occur. All care will be taken to prevent these side effects. If therapy is given over a long time, atrophy and wasting in the muscle injected may occur. Occasionally the patient's become refractory to treatment because they develop antibodies to the toxin. In this event, therapy needs to  be modified.  I have read the above information and consent to the administration of botulism toxin.    BOTOX PROCEDURE NOTE FOR MIGRAINE HEADACHE  Contraindications and precautions discussed with patient(above). Aseptic procedure was observed and patient  tolerated procedure. Procedure performed by Shawnie Dapper, FNP-C.   The condition has existed for more than 6 months, and pt does not have a diagnosis of ALS, Myasthenia Gravis or Lambert-Eaton Syndrome.  Risks and benefits of injections discussed and pt agrees to proceed with the procedure.  Written consent obtained  These injections are medically necessary. Pt  receives good benefits from these injections. These injections do not cause sedations or hallucinations which the oral therapies may cause.   Description of procedure:  The patient was placed in a sitting position. The standard protocol was used for Botox as follows, with 5 units of Botox injected at each site:  -Procerus muscle, midline injection  -Corrugator muscle, bilateral injection  -Frontalis muscle, bilateral injection, with 2 sites each side, medial injection was performed in the upper one third of the frontalis muscle, in the region vertical from the medial inferior edge of the superior orbital rim. The lateral injection was again in the upper one third of the forehead vertically above the lateral limbus of the cornea, 1.5 cm lateral to the medial injection site.  -Temporalis muscle injection, 4 sites, bilaterally. The first injection was 3 cm above the tragus of the ear, second injection site was 1.5 cm to 3 cm up from the first injection site in line with the tragus of the ear. The third injection site was 1.5-3 cm forward between the first 2 injection sites. The fourth injection site was 1.5 cm posterior to the second injection site. 5th site laterally in the temporalis  muscleat the level of the outer canthus.  -Occipitalis muscle injection, 3 sites, bilaterally. The first injection was done one half way between the occipital protuberance and the tip of the mastoid process behind the ear. The second injection site was done lateral and superior to the first, 1 fingerbreadth from the first injection. The third injection site was 1  fingerbreadth superiorly and medially from the first injection site.  -Cervical paraspinal muscle injection, 2 sites, bilaterally. The first injection site was 1 cm from the midline of the cervical spine, 3 cm inferior to the lower border of the occipital protuberance. The second injection site was 1.5 cm superiorly and laterally to the first injection site.  -Trapezius muscle injection was performed at 3 sites, bilaterally. The first injection site was in the upper trapezius muscle halfway between the inflection point of the neck, and the acromion. The second injection site was one half way between the acromion and the first injection site. The third injection was done between the first injection site and the inflection point of the neck.  -Masseter 10 units bilaterally   -bilateral orbicularis orbi injections at outer rim 3units   Will return for repeat injection in 3 months.   A total of 200 units of Botox was prepared, any Botox not injected was wasted. The patient tolerated the procedure well, there were no complications of the above procedure.

## 2021-05-27 ENCOUNTER — Other Ambulatory Visit: Payer: Self-pay

## 2021-05-27 ENCOUNTER — Ambulatory Visit (INDEPENDENT_AMBULATORY_CARE_PROVIDER_SITE_OTHER): Payer: Medicare Other | Admitting: Family Medicine

## 2021-05-27 ENCOUNTER — Encounter: Payer: Self-pay | Admitting: Family Medicine

## 2021-05-27 DIAGNOSIS — G43711 Chronic migraine without aura, intractable, with status migrainosus: Secondary | ICD-10-CM

## 2021-05-27 NOTE — Progress Notes (Signed)
Botox- 200 units x 1 vial Lot: [id number]  Expiration: 2020/01 NDC: 0023-3921-02  Bacteriostatic 0.9% Sodium Chloride- 26mL total Lot: [id number] Expiration: 11/2021 NDC: 3810-1751-02  Dx: g43.711  B/B

## 2021-06-02 ENCOUNTER — Encounter (INDEPENDENT_AMBULATORY_CARE_PROVIDER_SITE_OTHER): Payer: Medicare Other | Admitting: Ophthalmology

## 2021-06-08 ENCOUNTER — Encounter (INDEPENDENT_AMBULATORY_CARE_PROVIDER_SITE_OTHER): Payer: Medicare Other | Admitting: Ophthalmology

## 2021-06-24 DIAGNOSIS — I10 Essential (primary) hypertension: Secondary | ICD-10-CM | POA: Diagnosis not present

## 2021-06-24 DIAGNOSIS — R519 Headache, unspecified: Secondary | ICD-10-CM | POA: Diagnosis not present

## 2021-06-24 DIAGNOSIS — Z4681 Encounter for fitting and adjustment of insulin pump: Secondary | ICD-10-CM | POA: Diagnosis not present

## 2021-06-24 DIAGNOSIS — R600 Localized edema: Secondary | ICD-10-CM | POA: Diagnosis not present

## 2021-06-24 DIAGNOSIS — M26623 Arthralgia of bilateral temporomandibular joint: Secondary | ICD-10-CM | POA: Diagnosis not present

## 2021-06-24 DIAGNOSIS — E1040 Type 1 diabetes mellitus with diabetic neuropathy, unspecified: Secondary | ICD-10-CM | POA: Diagnosis not present

## 2021-06-24 DIAGNOSIS — Z794 Long term (current) use of insulin: Secondary | ICD-10-CM | POA: Diagnosis not present

## 2021-07-01 ENCOUNTER — Encounter (INDEPENDENT_AMBULATORY_CARE_PROVIDER_SITE_OTHER): Payer: Medicare Other | Admitting: Ophthalmology

## 2021-07-01 DIAGNOSIS — Z4681 Encounter for fitting and adjustment of insulin pump: Secondary | ICD-10-CM | POA: Diagnosis not present

## 2021-07-01 DIAGNOSIS — E103293 Type 1 diabetes mellitus with mild nonproliferative diabetic retinopathy without macular edema, bilateral: Secondary | ICD-10-CM | POA: Diagnosis not present

## 2021-07-01 DIAGNOSIS — I10 Essential (primary) hypertension: Secondary | ICD-10-CM | POA: Diagnosis not present

## 2021-07-01 DIAGNOSIS — K224 Dyskinesia of esophagus: Secondary | ICD-10-CM | POA: Diagnosis not present

## 2021-07-01 DIAGNOSIS — K219 Gastro-esophageal reflux disease without esophagitis: Secondary | ICD-10-CM | POA: Diagnosis not present

## 2021-07-01 DIAGNOSIS — G43711 Chronic migraine without aura, intractable, with status migrainosus: Secondary | ICD-10-CM | POA: Diagnosis not present

## 2021-07-01 DIAGNOSIS — E1043 Type 1 diabetes mellitus with diabetic autonomic (poly)neuropathy: Secondary | ICD-10-CM | POA: Diagnosis not present

## 2021-07-01 DIAGNOSIS — Z23 Encounter for immunization: Secondary | ICD-10-CM | POA: Diagnosis not present

## 2021-07-01 DIAGNOSIS — F419 Anxiety disorder, unspecified: Secondary | ICD-10-CM | POA: Diagnosis not present

## 2021-07-01 DIAGNOSIS — Z794 Long term (current) use of insulin: Secondary | ICD-10-CM | POA: Diagnosis not present

## 2021-07-01 DIAGNOSIS — E1040 Type 1 diabetes mellitus with diabetic neuropathy, unspecified: Secondary | ICD-10-CM | POA: Diagnosis not present

## 2021-07-01 DIAGNOSIS — G629 Polyneuropathy, unspecified: Secondary | ICD-10-CM | POA: Diagnosis not present

## 2021-07-01 DIAGNOSIS — M810 Age-related osteoporosis without current pathological fracture: Secondary | ICD-10-CM | POA: Diagnosis not present

## 2021-07-01 NOTE — Progress Notes (Signed)
[facility] - Clinic Note  07/05/2021     CHIEF COMPLAINT Patient presents for Retina Follow Up   HISTORY OF PRESENT ILLNESS: Taylor Adams is a 70 y.o. female who presents to the clinic today for:   HPI     Retina Follow Up   Patient presents with  Diabetic Retinopathy.  In both eyes.  This started 13 months ago.  I, the attending physician,  performed the HPI with the patient and updated documentation appropriately.        Comments   Patient here for 13 months retina follow up for yearly DM exam. Patient states vision doing fine. No  eye pain. BS 101 this am, A1C 5.8 done last week.      Last edited by Rennis Chris, MD on 07/08/2021  2:47 PM.    pt states her A1c was checked last Thursday and it was 5.8, she has a new dexcom sensor that is working really well for her, pt is seeing Dr. Alben Spittle for her regular eye care  Referring physician: Mateo Flow, MD [address] [place],  [place] [address]  HISTORICAL INFORMATION:  Selected notes from the MEDICAL RECORD NUMBER Referred by self for DM exam LEE:  Ocular Hx- PMH-   CURRENT MEDICATIONS: No current outpatient medications on file. (Ophthalmic Drugs)   No current facility-administered medications for this visit. (Ophthalmic Drugs)   Current Outpatient Medications (Other)  Medication Sig   ALPRAZolam (XANAX) 0.5 MG tablet Take 1 tablet (0.5 mg total) by mouth 2 (two) times daily as needed for anxiety.   B-D ULTRA-FINE 33 LANCETS MISC    BAYER CONTOUR NEXT TEST test strip    Biotin 5000 MCG TABS Take 5,000 mcg by mouth daily.    budesonide-formoterol (SYMBICORT) 80-4.5 MCG/ACT inhaler Inhale 2 puffs into the lungs 2 (two) times daily.   Cholecalciferol (VITAMIN D3) 1000 units CAPS Take 1,000 Units by mouth 2 (two) times daily.    Continuous Blood Gluc Sensor (DEXCOM G6 SENSOR) MISC USE AS DIRECTED & CHANGE SENSOR EVERY 30 DAYS   cyclobenzaprine (FLEXERIL) 10 MG tablet Take 10 mg by  mouth as needed.   GLUCAGEN HYPOKIT 1 MG SOLR injection    hydrOXYzine (ATARAX/VISTARIL) 25 MG tablet Take 25 mg by mouth 3 (three) times daily as needed.   hyoscyamine (LEVSIN SL) 0.125 MG SL tablet Place 0.125 mg under the tongue every 4 (four) hours as needed for cramping.   Insulin Human (INSULIN PUMP) 100 unit/ml SOLN Inject into the skin continuous. Novolog 100 unit/mL Basal Rate: 12 am to 4:30 am  0.375 4:30 am to 8 am   0.45 8 am to 12 pm    0.525 12 pm to 3 pm  0.45 3 pm to 9 pm 0.475 9 pm to 12 am 0.425   losartan (COZAAR) 25 MG tablet Take 50 mg by mouth daily.   Multiple Vitamin (MULTIVITAMIN) tablet Take 1 tablet by mouth daily. Women's 50+   oxyCODONE-acetaminophen (PERCOCET) 10-325 MG tablet Take 1 tablet by mouth every 6 (six) hours as needed for pain.   pantoprazole (PROTONIX) 40 MG tablet Take 1 tablet (40 mg total) by mouth daily. Take for 3 months.  Schedule office visit in 2 months.   Probiotic Product (VSL#3) CAPS Take 1 capsule by mouth daily.   rifaximin (XIFAXAN) 550 MG TABS tablet Take 1 tablet (550 mg total) by mouth 3 (three) times daily. FOR 14 DAYS   Rimegepant Sulfate (NURTEC) 75 MG  TBDP Take 75 mg by mouth daily as needed. For migraines. Take as close to onset of migraine as possible. One daily maximum.   No current facility-administered medications for this visit. (Other)   REVIEW OF SYSTEMS: ROS   Positive for: Gastrointestinal, Endocrine, Eyes, Psychiatric Negative for: Constitutional, Neurological, Skin, Genitourinary, Musculoskeletal, HENT, Cardiovascular, Respiratory, Allergic/Imm, Heme/Lymph Last edited by Laddie Aquas, COA on 07/05/2021  9:14 AM.    ALLERGIES Allergies  Allergen Reactions   Codeine Itching    Pt denies this is an allergy   Latex Rash    PAST MEDICAL HISTORY Past Medical History:  Diagnosis Date   Anxiety    Arthritis    Benign colon polyp    Cataracts, bilateral    Depression    Diabetes mellitus    Diarrhea     Gastroparesis    Headache    migraines   Intestinal bacterial overgrowth    Osteoporosis 2021   PONV (postoperative nausea and vomiting)    history of n/v   Salivary gland disorder    clogged salivary glands   SBO (small bowel obstruction) (HCC)    Past Surgical History:  Procedure Laterality Date   ABDOMINAL HYSTERECTOMY     APPENDECTOMY     BREAST EXCISIONAL BIOPSY Bilateral    benign   BREAST SURGERY     biopsy   CATARACT EXTRACTION Bilateral 2017   Dr. Vonna Kotyk   CATARACT EXTRACTION, BILATERAL     EYE SURGERY     ILEOCECETOMY  10/07/2013   Procedure: Ferne Coe;  Surgeon: Axel Filler, MD;  Location: [facility] OR;  Service: General;;   LAPAROTOMY N/A 10/07/2013   Procedure: EXPLORATORY LAPAROTOMY;  Surgeon: Axel Filler, MD;  Location: [facility] OR;  Service: General;  Laterality: N/A;   LYSIS OF ADHESION  10/07/2013   Procedure: LYSIS OF ADHESION;  Surgeon: Axel Filler, MD;  Location: [facility] OR;  Service: General;;   NASAL SEPTOPLASTY W/ TURBINOPLASTY Bilateral 12/28/2016   Procedure: NASAL SEPTOPLASTY WITH  BILATARAL TURBINATE REDUCTION;  Surgeon: Newman Pies, MD;  Location: [facility] OR;  Service: ENT;  Laterality: Bilateral;   resection of lymph node in neck     ROTATOR CUFF REPAIR     right side   SALIVARY GLAND SURGERY  11/25/2020   SINUS ENDO WITH FUSION Bilateral 12/28/2016   Procedure: ENDOSCOPIC TOTAL ETHMOIDECTOMY,ENDOSCOPIC MAXILLARY ANTROSTOMY ;  Surgeon: Newman Pies, MD;  Location: [facility] OR;  Service: ENT;  Laterality: Bilateral;   YAG LASER APPLICATION Bilateral 2018   FAMILY HISTORY Family History  Problem Relation Age of Onset   Colon polyps Father    Heart disease Father    Diabetes Father    Diabetes Mother    Heart disease Mother    Heart disease Brother    Allergies Brother    Allergies Sister    Colon cancer Neg Hx    Rectal cancer Neg Hx    Stomach cancer Neg Hx    Esophageal cancer Neg Hx    SOCIAL HISTORY Social History   Tobacco Use   Smoking status: Never    Smokeless tobacco: Never  Vaping Use   Vaping Use: Never used  Substance Use Topics   Alcohol use: Yes    Comment: glass of wine "every now and then"   Drug use: No       OPHTHALMIC EXAM: Base Eye Exam     Visual Acuity (Snellen - Linear)       Right Left   Dist Todd 20/20 20/20  Tonometry (Tonopen, 9:12 AM)       Right Left   Pressure 20 18         Pupils       Dark Light Shape React APD   Right 4 3 Round Brisk None   Left 4 3 Round Brisk None         Visual Fields (Counting fingers)       Left Right    Full Full         Extraocular Movement       Right Left    Full, Ortho Full, Ortho         Neuro/Psych     Oriented x3: Yes   Mood/Affect: Normal         Dilation     Both eyes: 1.0% Mydriacyl, 2.5% Phenylephrine @ 9:12 AM           Slit Lamp and Fundus Exam     Slit Lamp Exam       Right Left   Lids/Lashes Dermatochalasis - upper lid Dermatochalasis - upper lid, mild Meibomian gland dysfunction   Conjunctiva/Sclera White and quiet White and quiet   Cornea Well healed temporal cataract wounds, 1+Punctate epithelial erosions, arcus Well healed temporal cataract wounds, trace Punctate epithelial erosions   Anterior Chamber Deep and quiet Deep and quiet   Iris Round and dilated, No NVI Round and dilated, No NVI   Lens PC IOL in good position, trace Posterior capsular opacification PC IOL in good position with open PC   Vitreous Vitreous syneresis, vitreous condensations Vitreous syneresis, Posterior vitreous detachment, vitreous condensations         Fundus Exam       Right Left   Disc Compact, mild temporal Peripapillary atrophy, Pink and Sharp Compact, mild tilt, temporal PPA, Pink and Sharp   C/D Ratio 0.1 0.3   Macula Flat, Blunted foveal reflex, mild Retinal pigment epithelial mottling, No heme or edema Flat, blunted foveal reflex, mild Retinal pigment epithelial mottling, No heme or edema   Vessels Mild Vascular  attenuation, mild Tortuousity Mild Vascular attenuation, mild Tortuousity, mild AV crossing changes   Periphery Attached, rare MA, mild pigmented cystoid degeneration Attached, rare MA, mild pigmented cystoid degeneration           IMAGING AND PROCEDURES  Imaging and Procedures for @  OCT, Retina - OU - Both Eyes       Right Eye Quality was good. Central Foveal Thickness: 297. Progression has been stable. Findings include normal foveal contour, no IRF, no SRF.   Left Eye Quality was good. Central Foveal Thickness: 298. Progression has been stable. Findings include normal foveal contour, no IRF, no SRF.   Notes *Images captured and stored on drive  Diagnosis / Impression:  NFP, no IRF/SRF OU No DME OU  Clinical management:  See below  Abbreviations: NFP - Normal foveal profile. CME - cystoid macular edema. PED - pigment epithelial detachment. IRF - intraretinal fluid. SRF - subretinal fluid. EZ - ellipsoid zone. ERM - epiretinal membrane. ORA - outer retinal atrophy. ORT - outer retinal tubulation. SRHM - subretinal hyper-reflective material            ASSESSMENT/PLAN:    ICD-10-CM   1. Mild nonproliferative diabetic retinopathy of both eyes without macular edema associated with type 1 diabetes mellitus (HCC)  Z61.0960     2. Retinal edema  H35.81 OCT, Retina - OU - Both Eyes    3. Essential hypertension  I10  4. Hypertensive retinopathy of both eyes  H35.033     5. Pseudophakia of both eyes  Z96.1     6. History of myopia  Z86.69     7. Intractable chronic migraine without aura and with status migrainosus  G43.711     8. Visual disturbances  H53.9     1,2. Diabetes mellitus, type 1 with mild nonproliferative diabetic retinopathy OU -- stable  - exam shows just mild peripheral MA OU, no DME or MA in maculae OU  - The incidence, risk factors for progression, natural history and treatment options for diabetic retinopathy  were discussed with patient.     - The need for close monitoring of blood glucose, blood pressure, and serum lipids, avoiding cigarette or any type of tobacco, and the need for long term follow up was also discussed with patient.  - f/u 1 year, sooner prn  3,4. Hypertensive retinopathy OU  - discussed importance of tight BP control  - monitor  5. Pseudophakia OU  - s/p CE/IOL OU (Bevis)  - s/p YAG cap OS (Bevis)  - beautiful surgeries, doing well  - monitor  6. History of Myopia  - high myopia prior to cataract surgery  - myopic contour noted on OCT  7,8. Migraine with visual disturbances OS  - pt with history of intractable migraine w/ complaint of "fog/film" in left visual field  - following closely with Neurology  - nothing on dilated eye exam to correlate visual disturbance and on our objective testing is seeing 20/20 and no significant ocular findings  - recommend continued management with Neurology  Ophthalmic Meds Ordered this visit:  No orders of the defined types were placed in this encounter.    Return in about 1 year (around 07/05/2022) for f/u DM exam, DFE, OCT.  There are no Patient Instructions on file for this visit.  This document serves as a record of services personally performed by Karie Chimera, MD, PhD. It was created on their behalf by Herby Abraham, COA, an ophthalmic technician. The creation of this record is the provider's dictation and/or activities during the visit.    Electronically signed by: Herby Abraham, COA @TODAY @ 2:48 PM  This document serves as a record of services personally performed by , MD, PhD. It was created on their behalf by Karie Chimera. Glee Arvin, OA an ophthalmic technician. The creation of this record is the provider's dictation and/or activities during the visit.    Electronically signed by: Manson Passey. Glee Arvin, Manson Passey 10.03.2022 2:48 PM  12.03.2022, M.D., Ph.D. Diseases & Surgery of the Retina and Vitreous [facility] 07/05/2021   I have reviewed the above documentation for accuracy and completeness, and I agree with the above. 09/04/2021, M.D., Ph.D. 07/08/21 2:54 PM   Abbreviations: M myopia (nearsighted); A astigmatism; H hyperopia (farsighted); P presbyopia; Mrx spectacle prescription;  CTL contact lenses; OD right eye; OS left eye; OU both eyes  XT exotropia; ET esotropia; PEK punctate epithelial keratitis; PEE punctate epithelial erosions; DES dry eye syndrome; MGD meibomian gland dysfunction; ATs artificial tears; PFAT's preservative free artificial tears; NSC nuclear sclerotic cataract; PSC posterior subcapsular cataract; ERM epi-retinal membrane; PVD posterior vitreous detachment; RD retinal detachment; DM diabetes mellitus; DR diabetic retinopathy; NPDR non-proliferative diabetic retinopathy; PDR proliferative diabetic retinopathy; CSME clinically significant macular edema; DME diabetic macular edema; dbh dot blot hemorrhages; CWS cotton wool spot; POAG primary open angle glaucoma; C/D cup-to-disc ratio; HVF humphrey visual  field; GVF goldmann visual field; OCT optical coherence tomography; IOP intraocular pressure; BRVO Branch retinal vein occlusion; CRVO central retinal vein occlusion; CRAO central retinal artery occlusion; BRAO branch retinal artery occlusion; RT retinal tear; SB scleral buckle; PPV pars plana vitrectomy; VH Vitreous hemorrhage; PRP panretinal laser photocoagulation; IVK intravitreal kenalog; VMT vitreomacular traction; MH Macular hole;  NVD neovascularization of the disc; NVE neovascularization elsewhere; AREDS age related eye disease study; ARMD age related macular degeneration; POAG primary open angle glaucoma; EBMD epithelial/anterior basement membrane dystrophy; ACIOL anterior chamber intraocular lens; IOL intraocular lens; PCIOL posterior chamber intraocular lens; Phaco/IOL phacoemulsification with intraocular lens placement; PRK photorefractive keratectomy; LASIK laser  assisted in situ keratomileusis; HTN hypertension; DM diabetes mellitus; COPD chronic obstructive pulmonary disease

## 2021-07-05 ENCOUNTER — Other Ambulatory Visit: Payer: Self-pay

## 2021-07-05 ENCOUNTER — Ambulatory Visit (INDEPENDENT_AMBULATORY_CARE_PROVIDER_SITE_OTHER): Payer: Medicare Other | Admitting: Ophthalmology

## 2021-07-05 ENCOUNTER — Encounter (INDEPENDENT_AMBULATORY_CARE_PROVIDER_SITE_OTHER): Payer: Self-pay | Admitting: Ophthalmology

## 2021-07-05 DIAGNOSIS — Z961 Presence of intraocular lens: Secondary | ICD-10-CM

## 2021-07-05 DIAGNOSIS — H35033 Hypertensive retinopathy, bilateral: Secondary | ICD-10-CM

## 2021-07-05 DIAGNOSIS — G43711 Chronic migraine without aura, intractable, with status migrainosus: Secondary | ICD-10-CM | POA: Diagnosis not present

## 2021-07-05 DIAGNOSIS — H3581 Retinal edema: Secondary | ICD-10-CM

## 2021-07-05 DIAGNOSIS — H539 Unspecified visual disturbance: Secondary | ICD-10-CM

## 2021-07-05 DIAGNOSIS — E103293 Type 1 diabetes mellitus with mild nonproliferative diabetic retinopathy without macular edema, bilateral: Secondary | ICD-10-CM

## 2021-07-05 DIAGNOSIS — Z8669 Personal history of other diseases of the nervous system and sense organs: Secondary | ICD-10-CM | POA: Diagnosis not present

## 2021-07-05 DIAGNOSIS — I10 Essential (primary) hypertension: Secondary | ICD-10-CM | POA: Diagnosis not present

## 2021-07-08 ENCOUNTER — Other Ambulatory Visit (INDEPENDENT_AMBULATORY_CARE_PROVIDER_SITE_OTHER): Payer: Medicare Other

## 2021-07-08 ENCOUNTER — Ambulatory Visit (INDEPENDENT_AMBULATORY_CARE_PROVIDER_SITE_OTHER): Payer: Medicare Other | Admitting: Gastroenterology

## 2021-07-08 ENCOUNTER — Encounter (INDEPENDENT_AMBULATORY_CARE_PROVIDER_SITE_OTHER): Payer: Self-pay | Admitting: Ophthalmology

## 2021-07-08 ENCOUNTER — Encounter: Payer: Self-pay | Admitting: Gastroenterology

## 2021-07-08 VITALS — BP 112/70 | HR 67 | Ht 62.0 in | Wt 115.0 lb

## 2021-07-08 DIAGNOSIS — R1011 Right upper quadrant pain: Secondary | ICD-10-CM

## 2021-07-08 DIAGNOSIS — R1031 Right lower quadrant pain: Secondary | ICD-10-CM | POA: Diagnosis not present

## 2021-07-08 DIAGNOSIS — K6389 Other specified diseases of intestine: Secondary | ICD-10-CM

## 2021-07-08 DIAGNOSIS — Z8719 Personal history of other diseases of the digestive system: Secondary | ICD-10-CM | POA: Diagnosis not present

## 2021-07-08 DIAGNOSIS — K580 Irritable bowel syndrome with diarrhea: Secondary | ICD-10-CM

## 2021-07-08 DIAGNOSIS — K638219 Small intestinal bacterial overgrowth, unspecified: Secondary | ICD-10-CM

## 2021-07-08 DIAGNOSIS — R110 Nausea: Secondary | ICD-10-CM

## 2021-07-08 DIAGNOSIS — G8929 Other chronic pain: Secondary | ICD-10-CM

## 2021-07-08 LAB — CREATININE, SERUM: Creatinine, Ser: 0.79 mg/dL (ref 0.40–1.20)

## 2021-07-08 LAB — BUN: BUN: 14 mg/dL (ref 6–23)

## 2021-07-08 MED ORDER — RIFAXIMIN 550 MG PO TABS: 550.0000 mg | ORAL_TABLET | Freq: Three times a day (TID) | ORAL | 0 refills | Status: DC

## 2021-07-08 NOTE — Patient Instructions (Addendum)
Your provider has requested that you go to the basement level for lab work before leaving today. Press "B" on the elevator. The lab is located at the first door on the left as you exit the elevator.    You are scheduled on ________ at __________. You should arrive 15 minutes prior to your appointment time for registration. Please follow the written instructions below on the day of your exam:  WARNING: IF YOU ARE ALLERGIC TO IODINE/X-RAY DYE, PLEASE NOTIFY RADIOLOGY IMMEDIATELY AT [phone]! YOU WILL BE GIVEN A 13 HOUR PREMEDICATION PREP.  1) Do not eat or drink anything after ______ (4 hours prior to your test) 2) You have been given 2 bottles of oral contrast to drink. The solution may taste better if refrigerated, but do NOT add ice or any other liquid to this solution. Shake well before drinking.    Drink 1 bottle of contrast @ _______ (2 hours prior to your exam)  Drink 1 bottle of contrast @ ________Due to recent changes in healthcare laws, you may see the results of your imaging and laboratory studies on MyChart before your provider has had a chance to review them.  We understand that in some cases there may be results that are confusing or concerning to you. Not all laboratory results come back in the same time frame and the provider may be waiting for multiple results in order to interpret others.  Please give [place] 48 hours in order for your provider to thoroughly review all the results before contacting the office for clarification of your results.  Marland KitchenLBGR (1 hour prior to your exam)  You may take any medications as prescribed with a small amount of water, if necessary. If you take any of the following medications: METFORMIN, GLUCOPHAGE, GLUCOVANCE, AVANDAMET, RIOMET, FORTAMET, [place] MET, JANUMET, GLUMETZA or METAGLIP, you MAY be asked to HOLD this medication 48 hours AFTER the exam.  The purpose of you drinking the oral contrast is to aid in the visualization of your intestinal tract. The  contrast solution may cause some diarrhea. Depending on your individual set of symptoms, you may also receive an intravenous injection of x-ray contrast/dye. Plan on being at [facility] for 30 minutes or longer, depending on the type of exam you are having performed.  This test typically takes 30-45 minutes to complete.  You will be contacted by [place] in the next 2 days to arrange a ct ABDOMEN/pELVIS  The number on your caller ID will be [phone], please answer when they call.  If you have not heard from them in 2 days please call [phone] to schedule.    START XIFAXIAN AFTER CT SCAN   We have sent your demographic information and a prescription for Xifaxan to [facility]. This pharmacy is able to get medication approved through insurance and get you the lowest copay possible. If you have not heard from them within 1 week, please call our office at [phone] to let us know.     If you are age 38 or older, your body mass index should be between 23-30. Your Body mass index is 21.03 kg/m. If this is out of the aforementioned range listed, please consider follow up with your Primary Care Provider.  If you are age 50 or younger, your body mass index should be between 19-25. Your Body mass index is 21.03 kg/m. If this is out of the aformentioned range listed, please consider follow up with your Primary Care Provider.  __________________________________________________________  The [facility] providers would like to encourage you to use [facility] to communicate with providers for non-urgent requests or questions.  Due to long hold times on the telephone, sending your provider a message by [facility] may be a faster and more efficient way to get a response.  Please allow 48 business hours for a response.  Please remember that this is for non-urgent requests.    Thank you for choosing [facility]  Karleen Hampshire Nandigam,MD

## 2021-07-08 NOTE — Progress Notes (Signed)
Taylor Adams [facility]    [id number]    1951/02/28  Primary Care Physician:Tisovec, Adelfa Koh, MD  Referring Physician: Gaspar Garbe, MD [address] [place],  [place] [address]   Chief complaint: Nausea, abdominal discomfort  HPI:  70 year old very pleasant female here with complaints of nausea, vomiting, abdominal bloating with discomfort, excessive belching and intermittent diarrhea   Overall her symptoms have improved to some extent after Xifaxan but she continues to have persistent milder symptoms  EGD July 2022: Mild duodenitis otherwise unremarkable  She has history of SBO   She was treated with Xifaxan in 2017 for small intestinal bacterial overgrowth and she has been relatively symptom-free for past few years until recent flare up.     She is insulin-dependent diabetic, is currently on insulin pump overall her blood sugars are well controlled. Her symptoms started after she had salivary gland surgery in Feb 2022 at [facility] she has bilateral stents in her salivary ducts which she has to manipulate and clear almost daily.   GI Hx:   High-grade SBO in January 2015 , she underwent exploratory laparotomy with lysis of adhesions and ileocectomy.   In 2002 she was diagnosed with bacterial overgrowth on a breath Hydrogen test.   She has history of gastroparesis. She was on Reglan but it was discontinued in 2015 given no significant improvement  Patient had a colonoscopy in 2002 with removal of a tubular adenoma. A repeat colonoscopy in 2007 was normal. Recent colonoscopy in July 2017 was unremarkable as well except for sigmoid diverticulosis .    Other past GI workup include  small bowel follow-through in 2005 was normal. An upper abdominal ultrasound in 2005 was negative. Her celiac profile was normal in 2005.   Outpatient Encounter Medications as of 07/08/2021  Medication Sig   ALPRAZolam (XANAX) 0.5 MG tablet Take 1 tablet (0.5 mg total) by mouth 2 (two) times  daily as needed for anxiety.   B-D ULTRA-FINE 33 LANCETS MISC    BAYER CONTOUR NEXT TEST test strip    Biotin 5000 MCG TABS Take 5,000 mcg by mouth daily.    budesonide-formoterol (SYMBICORT) 80-4.5 MCG/ACT inhaler Inhale 2 puffs into the lungs 2 (two) times daily.   Cholecalciferol (VITAMIN D3) 1000 units CAPS Take 1,000 Units by mouth 2 (two) times daily.    Continuous Blood Gluc Sensor (DEXCOM G6 SENSOR) MISC USE AS DIRECTED & CHANGE SENSOR EVERY 30 DAYS   cyclobenzaprine (FLEXERIL) 10 MG tablet Take 10 mg by mouth as needed.   GLUCAGEN HYPOKIT 1 MG SOLR injection    hydrOXYzine (ATARAX/VISTARIL) 25 MG tablet Take 25 mg by mouth 3 (three) times daily as needed.   hyoscyamine (LEVSIN SL) 0.125 MG SL tablet Place 0.125 mg under the tongue every 4 (four) hours as needed for cramping.   Insulin Human (INSULIN PUMP) 100 unit/ml SOLN Inject into the skin continuous. Novolog 100 unit/mL Basal Rate: 12 am to 4:30 am  0.375 4:30 am to 8 am   0.45 8 am to 12 pm    0.525 12 pm to 3 pm  0.45 3 pm to 9 pm 0.475 9 pm to 12 am 0.425   losartan (COZAAR) 25 MG tablet Take 50 mg by mouth daily.   Multiple Vitamin (MULTIVITAMIN) tablet Take 1 tablet by mouth daily. Women's 50+   oxyCODONE-acetaminophen (PERCOCET) 10-325 MG tablet Take 1 tablet by mouth every 6 (six)  hours as needed for pain.   pantoprazole (PROTONIX) 40 MG tablet Take 1 tablet (40 mg total) by mouth daily. Take for 3 months.  Schedule office visit in 2 months.   Probiotic Product (VSL#3) CAPS Take 1 capsule by mouth daily.   Rimegepant Sulfate (NURTEC) 75 MG TBDP Take 75 mg by mouth daily as needed. For migraines. Take as close to onset of migraine as possible. One daily maximum.   No facility-administered encounter medications on file as of 07/08/2021.    Allergies as of 07/08/2021 - Review Complete 07/08/2021  Allergen Reaction Noted   Codeine Itching 04/15/2016   Latex Rash 04/15/2016    Past Medical History:  Diagnosis Date    Anxiety    Arthritis    Benign colon polyp    Cataracts, bilateral    Depression    Diabetes mellitus    Diarrhea    Gastroparesis    Headache    migraines   Intestinal bacterial overgrowth    Osteoporosis 2021   PONV (postoperative nausea and vomiting)    history of n/v   SBO (small bowel obstruction) (HCC)     Past Surgical History:  Procedure Laterality Date   ABDOMINAL HYSTERECTOMY     APPENDECTOMY     BREAST EXCISIONAL BIOPSY Bilateral    benign   BREAST SURGERY     biopsy   CATARACT EXTRACTION Bilateral 2017   Dr. Vonna Kotyk   CATARACT EXTRACTION, BILATERAL     EYE SURGERY     ILEOCECETOMY  10/07/2013   Procedure: Ferne Coe;  Surgeon: Axel Filler, MD;  Location: [facility] OR;  Service: General;;   LAPAROTOMY N/A 10/07/2013   Procedure: EXPLORATORY LAPAROTOMY;  Surgeon: Axel Filler, MD;  Location: [facility] OR;  Service: General;  Laterality: N/A;   LYSIS OF ADHESION  10/07/2013   Procedure: LYSIS OF ADHESION;  Surgeon: Axel Filler, MD;  Location: [facility] OR;  Service: General;;   NASAL SEPTOPLASTY W/ TURBINOPLASTY Bilateral 12/28/2016   Procedure: NASAL SEPTOPLASTY WITH  BILATARAL TURBINATE REDUCTION;  Surgeon: Newman Pies, MD;  Location: [facility] OR;  Service: ENT;  Laterality: Bilateral;   resection of lymph node in neck     ROTATOR CUFF REPAIR     right side   SALIVARY GLAND SURGERY  11/25/2020   SINUS ENDO WITH FUSION Bilateral 12/28/2016   Procedure: ENDOSCOPIC TOTAL ETHMOIDECTOMY,ENDOSCOPIC MAXILLARY ANTROSTOMY ;  Surgeon: Newman Pies, MD;  Location: [facility] OR;  Service: ENT;  Laterality: Bilateral;   YAG LASER APPLICATION Bilateral 2018    Family History  Problem Relation Age of Onset   Colon polyps Father    Heart disease Father    Diabetes Father    Diabetes Mother    Heart disease Mother    Heart disease Brother    Allergies Brother    Allergies Sister    Colon cancer Neg Hx    Rectal cancer Neg Hx    Stomach cancer Neg Hx    Esophageal cancer Neg Hx     Social History    Socioeconomic History   Marital status: Married    Spouse name: Not on file   Number of children: Not on file   Years of education: Not on file   Highest education level: Not on file  Occupational History   Not on file  Tobacco Use   Smoking status: Never   Smokeless tobacco: Never  Vaping Use   Vaping Use: Never used  Substance and Sexual Activity   Alcohol use: Yes  Comment: glass of wine "every now and then"   Drug use: No   Sexual activity: Not on file  Other Topics Concern   Not on file  Social History Narrative   Lives at home with her husband   Right handed   Caffeine: 2 cups per day   Social Determinants of Health   Financial Resource Strain: Not on file  Food Insecurity: Not on file  Transportation Needs: Not on file  Physical Activity: Not on file  Stress: Not on file  Social Connections: Not on file  Intimate Partner Violence: Not on file      Review of systems: All other review of systems negative except as mentioned in the HPI.   Physical Exam: Vitals:   07/08/21 0938  BP: 112/70  Pulse: 67  SpO2: 97%   Body mass index is 21.03 kg/m. Gen:      No acute distress HEENT:  sclera anicteric Abd:      soft, non-tender; no palpable masses, no distension Ext:    No edema Neuro: alert and oriented x 3 Psych: normal mood and affect  Data Reviewed:  Reviewed labs, radiology imaging, old records and pertinent past GI work up   Assessment and Plan/Recommendations:  70 year old female status post appendectomy and abdominal hysterectomy, high-grade SBO in January 2015 status post ex-lap, ileo cecectomy and lysis of adhesions, insulin-dependent diabetes here with complaints of nausea, vomiting, abdominal bloating, excessive belching and intermittent diarrhea Will obtain CT abdomen pelvis with contrast to exclude partial bowel obstruction or internal hernia  Check BUN and creatinine  Will consider empiric therapy with 2-week course of Xifaxan 550  mg 3 times daily for IBS diarrhea and small intestinal bacterial overgrowth if CT abdomen pelvis is negative for any significant acute pathology  Return in 2 months  The patient was provided an opportunity to ask questions and all were answered. The patient agreed with the plan and demonstrated an understanding of the instructions.  Iona Beard , MD    CC: Tisovec, Adelfa Koh, MD

## 2021-07-09 ENCOUNTER — Encounter: Payer: Self-pay | Admitting: Gastroenterology

## 2021-07-14 ENCOUNTER — Telehealth: Payer: Self-pay | Admitting: Gastroenterology

## 2021-07-14 MED ORDER — RIFAXIMIN 550 MG PO TABS: 550.0000 mg | ORAL_TABLET | Freq: Three times a day (TID) | ORAL | 0 refills | Status: DC

## 2021-07-14 NOTE — Telephone Encounter (Signed)
Inbound call from [facility] pharmacy. States patient need prior auth for xifaxan. Can be started by calling [phone].  Pharmacy call back [phone] option 2   reference number [id number]

## 2021-07-14 NOTE — Telephone Encounter (Signed)
I resubmitted Xifaxian through [facility]

## 2021-07-15 ENCOUNTER — Ambulatory Visit (HOSPITAL_COMMUNITY)
Admission: RE | Admit: 2021-07-15 | Discharge: 2021-07-15 | Disposition: A | Discharge status: Home / Self Care | Payer: Medicare Other | Source: Ambulatory Visit | Attending: Gastroenterology | Admitting: Gastroenterology

## 2021-07-15 ENCOUNTER — Telehealth: Payer: Self-pay | Admitting: Gastroenterology

## 2021-07-15 DIAGNOSIS — G8929 Other chronic pain: Secondary | ICD-10-CM | POA: Insufficient documentation

## 2021-07-15 DIAGNOSIS — R109 Unspecified abdominal pain: Secondary | ICD-10-CM | POA: Diagnosis not present

## 2021-07-15 DIAGNOSIS — R110 Nausea: Secondary | ICD-10-CM | POA: Insufficient documentation

## 2021-07-15 DIAGNOSIS — K6389 Other specified diseases of intestine: Secondary | ICD-10-CM | POA: Diagnosis not present

## 2021-07-15 DIAGNOSIS — K580 Irritable bowel syndrome with diarrhea: Secondary | ICD-10-CM | POA: Diagnosis not present

## 2021-07-15 DIAGNOSIS — R1011 Right upper quadrant pain: Secondary | ICD-10-CM | POA: Diagnosis not present

## 2021-07-15 DIAGNOSIS — Z8719 Personal history of other diseases of the digestive system: Secondary | ICD-10-CM | POA: Diagnosis not present

## 2021-07-15 DIAGNOSIS — R1031 Right lower quadrant pain: Secondary | ICD-10-CM | POA: Insufficient documentation

## 2021-07-15 MED ORDER — IOHEXOL 350 MG/ML SOLN
80.0000 mL | Freq: Once | INTRAVENOUS | Status: AC | PRN
  Administered 2021-07-15: 80 mL via INTRAVENOUS

## 2021-07-15 NOTE — Telephone Encounter (Signed)
I have samples for patient to pick up, she will wait to hear from Dr Lavon Paganini about the CT scan results first to see if she needs to take them   Dr Lavon Paganini, her CT results are back if you have time to review , well I see the images

## 2021-07-15 NOTE — Telephone Encounter (Signed)
CT results are not in epic yet, please hold on to samples of Xifaxan. Thanks

## 2021-07-15 NOTE — Telephone Encounter (Signed)
Inbound call from [place] [facility] stating that the medication Xifaxan will need a prior auth. Her best contact information is [phone] ref # [id number]. Please advise. Thank you.

## 2021-07-16 NOTE — Telephone Encounter (Signed)
Called patient and she is aware to pick up samples today CT Negative

## 2021-07-18 ENCOUNTER — Other Ambulatory Visit: Payer: Self-pay | Admitting: Gastroenterology

## 2021-07-26 NOTE — Telephone Encounter (Signed)
Left message for patient that the rest of her xifaxian is here

## 2021-07-26 NOTE — Telephone Encounter (Signed)
Patient calling to follow up on the additional 12 tablets she was recommended to get.

## 2021-07-26 NOTE — Telephone Encounter (Signed)
Taylor Adams coming today to bring the rest of her samples to complete her treatment. Called pt to inform I will call her back when he drops them off

## 2021-08-13 DIAGNOSIS — R350 Frequency of micturition: Secondary | ICD-10-CM | POA: Diagnosis not present

## 2021-08-13 DIAGNOSIS — R300 Dysuria: Secondary | ICD-10-CM | POA: Diagnosis not present

## 2021-08-13 DIAGNOSIS — N390 Urinary tract infection, site not specified: Secondary | ICD-10-CM | POA: Diagnosis not present

## 2021-08-23 ENCOUNTER — Encounter: Payer: Self-pay | Admitting: Family Medicine
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
## 2021-08-25 ENCOUNTER — Other Ambulatory Visit: Payer: Self-pay

## 2021-08-25 ENCOUNTER — Ambulatory Visit: Payer: Medicare Other | Admitting: Family Medicine

## 2021-08-25 ENCOUNTER — Ambulatory Visit (INDEPENDENT_AMBULATORY_CARE_PROVIDER_SITE_OTHER): Payer: Medicare Other | Admitting: Family Medicine

## 2021-08-25 ENCOUNTER — Encounter: Payer: Self-pay | Admitting: Family Medicine

## 2021-08-25 VITALS — BP 112/66 | HR 67 | Ht 62.0 in | Wt 114.0 lb

## 2021-08-25 DIAGNOSIS — G43711 Chronic migraine without aura, intractable, with status migrainosus: Secondary | ICD-10-CM

## 2021-08-25 NOTE — Progress Notes (Signed)
Botox- 200 units x 1 vial Lot: [id number] Expiration: 05/25 NDC: 0037-0488-89  0.9% Sodium Chloride- 6mL total Lot: [id number] Expiration: 10/03/22 NDC: 3888-2800-34  Dx: J17.915 B/B

## 2021-09-07 ENCOUNTER — Ambulatory Visit: Payer: Medicare Other | Admitting: Family Medicine

## 2021-09-09 DIAGNOSIS — I10 Essential (primary) hypertension: Secondary | ICD-10-CM | POA: Diagnosis not present

## 2021-09-09 DIAGNOSIS — R5383 Other fatigue: Secondary | ICD-10-CM | POA: Diagnosis not present

## 2021-09-09 DIAGNOSIS — M810 Age-related osteoporosis without current pathological fracture: Secondary | ICD-10-CM | POA: Diagnosis not present

## 2021-09-09 DIAGNOSIS — Z794 Long term (current) use of insulin: Secondary | ICD-10-CM | POA: Diagnosis not present

## 2021-09-09 DIAGNOSIS — E1043 Type 1 diabetes mellitus with diabetic autonomic (poly)neuropathy: Secondary | ICD-10-CM | POA: Diagnosis not present

## 2021-09-13 DIAGNOSIS — Z794 Long term (current) use of insulin: Secondary | ICD-10-CM | POA: Diagnosis not present

## 2021-09-13 DIAGNOSIS — L209 Atopic dermatitis, unspecified: Secondary | ICD-10-CM | POA: Diagnosis not present

## 2021-09-13 DIAGNOSIS — I10 Essential (primary) hypertension: Secondary | ICD-10-CM | POA: Diagnosis not present

## 2021-09-13 DIAGNOSIS — G43711 Chronic migraine without aura, intractable, with status migrainosus: Secondary | ICD-10-CM | POA: Diagnosis not present

## 2021-09-13 DIAGNOSIS — Z0000 Encounter for general adult medical examination without abnormal findings: Secondary | ICD-10-CM | POA: Diagnosis not present

## 2021-09-13 DIAGNOSIS — Z1339 Encounter for screening examination for other mental health and behavioral disorders: Secondary | ICD-10-CM | POA: Diagnosis not present

## 2021-09-13 DIAGNOSIS — E1043 Type 1 diabetes mellitus with diabetic autonomic (poly)neuropathy: Secondary | ICD-10-CM | POA: Diagnosis not present

## 2021-09-13 DIAGNOSIS — J45991 Cough variant asthma: Secondary | ICD-10-CM | POA: Diagnosis not present

## 2021-09-13 DIAGNOSIS — Z1331 Encounter for screening for depression: Secondary | ICD-10-CM | POA: Diagnosis not present

## 2021-09-13 DIAGNOSIS — R82998 Other abnormal findings in urine: Secondary | ICD-10-CM | POA: Diagnosis not present

## 2021-09-13 DIAGNOSIS — F419 Anxiety disorder, unspecified: Secondary | ICD-10-CM | POA: Diagnosis not present

## 2021-09-13 DIAGNOSIS — E1040 Type 1 diabetes mellitus with diabetic neuropathy, unspecified: Secondary | ICD-10-CM | POA: Diagnosis not present

## 2021-09-13 DIAGNOSIS — M810 Age-related osteoporosis without current pathological fracture: Secondary | ICD-10-CM | POA: Diagnosis not present

## 2021-09-13 DIAGNOSIS — Z4681 Encounter for fitting and adjustment of insulin pump: Secondary | ICD-10-CM | POA: Diagnosis not present

## 2021-09-13 DIAGNOSIS — I7300 Raynaud's syndrome without gangrene: Secondary | ICD-10-CM | POA: Diagnosis not present

## 2021-10-13 ENCOUNTER — Other Ambulatory Visit: Payer: Self-pay | Admitting: Gastroenterology

## 2021-11-04 ENCOUNTER — Encounter: Payer: Self-pay | Admitting: Gastroenterology

## 2021-11-08 NOTE — Telephone Encounter (Signed)
Please obtain upper GI series with small bowel follow through to exclude any partial small bowel obstruction and  schedule office visit with me or APP soon.

## 2021-11-09 ENCOUNTER — Other Ambulatory Visit: Payer: Self-pay

## 2021-11-09 DIAGNOSIS — R110 Nausea: Secondary | ICD-10-CM

## 2021-11-10 DIAGNOSIS — Z961 Presence of intraocular lens: Secondary | ICD-10-CM | POA: Diagnosis not present

## 2021-11-10 DIAGNOSIS — E109 Type 1 diabetes mellitus without complications: Secondary | ICD-10-CM | POA: Diagnosis not present

## 2021-11-10 DIAGNOSIS — H524 Presbyopia: Secondary | ICD-10-CM | POA: Diagnosis not present

## 2021-11-10 DIAGNOSIS — H5319 Other subjective visual disturbances: Secondary | ICD-10-CM | POA: Diagnosis not present

## 2021-11-16 ENCOUNTER — Other Ambulatory Visit: Payer: Self-pay

## 2021-11-16 ENCOUNTER — Ambulatory Visit (HOSPITAL_COMMUNITY)
Admission: RE | Admit: 2021-11-16 | Discharge: 2021-11-16 | Disposition: A | Discharge status: Home / Self Care | Payer: Medicare Other | Source: Ambulatory Visit | Attending: Gastroenterology | Admitting: Gastroenterology

## 2021-11-16 DIAGNOSIS — R110 Nausea: Secondary | ICD-10-CM | POA: Diagnosis not present

## 2021-11-16 DIAGNOSIS — Z0389 Encounter for observation for other suspected diseases and conditions ruled out: Secondary | ICD-10-CM | POA: Diagnosis not present

## 2021-11-19 ENCOUNTER — Ambulatory Visit (INDEPENDENT_AMBULATORY_CARE_PROVIDER_SITE_OTHER): Payer: Medicare Other | Admitting: Physician Assistant

## 2021-11-19 ENCOUNTER — Encounter: Payer: Self-pay | Admitting: Physician Assistant

## 2021-11-19 VITALS — BP 112/64 | HR 62 | Ht 62.0 in | Wt 116.2 lb

## 2021-11-19 DIAGNOSIS — K3184 Gastroparesis: Secondary | ICD-10-CM

## 2021-11-19 DIAGNOSIS — R110 Nausea: Secondary | ICD-10-CM

## 2021-11-19 DIAGNOSIS — R109 Unspecified abdominal pain: Secondary | ICD-10-CM | POA: Diagnosis not present

## 2021-11-19 MED ORDER — DICYCLOMINE HCL 10 MG PO CAPS: 10.0000 mg | ORAL_CAPSULE | Freq: Four times a day (QID) | ORAL | 2 refills | Status: DC | PRN

## 2021-11-19 NOTE — Patient Instructions (Signed)
We have sent the following medications to your pharmacy for you to pick up at your convenience: Restart Pantoprazole 40 mg daily 30-60 minutes before breakfast.  Dicyclomine 10 mg four times daily as needed.   If you are age 70 or older, your body mass index should be between 23-30. Your Body mass index is 21.26 kg/m. If this is out of the aforementioned range listed, please consider follow up with your Primary Care Provider.  If you are age 16 or younger, your body mass index should be between 19-25. Your Body mass index is 21.26 kg/m. If this is out of the aformentioned range listed, please consider follow up with your Primary Care Provider.   ________________________________________________________  The [facility] providers would like to encourage you to use [facility] to communicate with providers for non-urgent requests or questions.  Due to long hold times on the telephone, sending your provider a message by [facility] may be a faster and more efficient way to get a response.  Please allow 48 business hours for a response.  Please remember that this is for non-urgent requests.  _______________________________________________________

## 2021-11-19 NOTE — Progress Notes (Signed)
Chief Complaint: Nausea, abdominal pain  HPI:    Taylor Adams is a 71 year old female with a past medical history as listed below including gastroparesis, diabetes and small bowel obstructions, known to Dr. Silverio Decamp, who presents clinic today with a complaint of nausea and abdominal pain.    07/08/2021 office visit with Dr. Silverio Decamp I discussed nausea, vomiting, abdominal bloating and discomfort as well as excessive belching and intermittent diarrhea, had previously been treated with Xifaxan with some relief.  Discussed EGD in July with mild duodenitis and otherwise unremarkable.  Gust history of SBO in 2015.  Discussed patient had previously been on a trial of Reglan which was not helpful.  She was given another 2-week course of Xifaxan and a CT was ordered.  CT was normal.    11/04/2021 patient called in and describes she was having a lot of abdominal pain.  Dr. Silverio Decamp wanted an upper GI study.    11/16/2021 patient had an upper GI with small bowel follow-through which was normal.    Today, patient presents to clinic and tells me that she has had constant nausea and abdominal cramping pain since being seen in October, but about 2 to 3 weeks ago it was "really bad".  She tells me she basically just sat at home with a heating pad over her lap and eventually it seemed to let up.  At that time was taking Hyoscyamine 2 tablets 4 times a day which did not seem to be helping.  Tells me that typically she will feel the spasms if she moves in the wrong direction and it feels like it is twisting and contracting and then she has to move in a different direction and it typically releases but it did not do that 2 to 3 weeks ago.  Also with constant nausea and occasional vomiting.  She has completely eliminated some things from her diet including raw apples and salads, currently living on a mostly bland diet applesauce bananas soup etc.  Does tell me she felt some better on the Pantoprazole 40 mg but was told to only  use this for 2 months so she has stopped it.  Does not recall being on Reglan.    Denies fever, chills, weight loss,, change in bowel habits or symptoms that awaken her from sleep.  Past Medical History:  Diagnosis Date   Anxiety    Arthritis    Benign colon polyp    Cataracts, bilateral    Depression    Diabetes mellitus    Diarrhea    Gastroparesis    Headache    migraines   Intestinal bacterial overgrowth    Osteoporosis 2021   PONV (postoperative nausea and vomiting)    history of n/v   Salivary gland disorder    clogged salivary glands   SBO (small bowel obstruction) ([place])     Past Surgical History:  Procedure Laterality Date   ABDOMINAL HYSTERECTOMY     APPENDECTOMY     BREAST EXCISIONAL BIOPSY Bilateral    benign   BREAST SURGERY     biopsy   CATARACT EXTRACTION Bilateral 2017   Dr. Talbert Forest   CATARACT EXTRACTION, BILATERAL     EYE SURGERY     ILEOCECETOMY  10/07/2013   Procedure: Pennie Rushing;  Surgeon: Ralene Ok, MD;  Location: [facility];  Service: General;;   LAPAROTOMY N/A 10/07/2013   Procedure: EXPLORATORY LAPAROTOMY;  Surgeon: Ralene Ok, MD;  Location: [place];  Service: General;  Laterality: N/A;   LYSIS OF  ADHESION  10/07/2013   Procedure: LYSIS OF ADHESION;  Surgeon: Ralene Ok, MD;  Location: [place];  Service: General;;   NASAL SEPTOPLASTY W/ TURBINOPLASTY Bilateral 12/28/2016   Procedure: NASAL SEPTOPLASTY WITH  BILATARAL TURBINATE REDUCTION;  Surgeon: Leta Baptist, MD;  Location: [facility];  Service: ENT;  Laterality: Bilateral;   resection of lymph node in neck     ROTATOR CUFF REPAIR     right side   SALIVARY GLAND SURGERY  11/25/2020   SINUS ENDO WITH FUSION Bilateral 12/28/2016   Procedure: ENDOSCOPIC TOTAL ETHMOIDECTOMY,ENDOSCOPIC MAXILLARY ANTROSTOMY ;  Surgeon: Leta Baptist, MD;  Location: [facility];  Service: ENT;  Laterality: Bilateral;   YAG LASER APPLICATION Bilateral [id number]    Current Outpatient Medications  Medication Sig Dispense Refill   ALPRAZolam  (XANAX) 0.5 MG tablet Take 1 tablet (0.5 mg total) by mouth 2 (two) times daily as needed for anxiety. 30 tablet 0   B-D ULTRA-FINE 33 LANCETS MISC      BAYER CONTOUR NEXT TEST test strip      Biotin 5000 MCG TABS Take 5,000 mcg by mouth daily.      budesonide-formoterol (SYMBICORT) 80-4.5 MCG/ACT inhaler Inhale 2 puffs into the lungs 2 (two) times daily. 1 Inhaler 11   Cholecalciferol (VITAMIN D3) 1000 units CAPS Take 1,000 Units by mouth 2 (two) times daily.      Continuous Blood Gluc Sensor (DEXCOM G6 SENSOR) MISC USE AS DIRECTED & CHANGE SENSOR EVERY 30 DAYS     cyclobenzaprine (FLEXERIL) 10 MG tablet Take 10 mg by mouth as needed.     GLUCAGEN HYPOKIT 1 MG SOLR injection   6   hydrOXYzine (ATARAX/VISTARIL) 25 MG tablet Take 25 mg by mouth 3 (three) times daily as needed.     Insulin Human (INSULIN PUMP) 100 unit/ml SOLN Inject into the skin continuous. Novolog 100 unit/mL Basal Rate: 12 am to 4:30 am  0.375 4:30 am to 8 am   0.45 8 am to 12 pm    0.525 12 pm to 3 pm  0.45 3 pm to 9 pm 0.475 9 pm to 12 am 0.425     losartan (COZAAR) 25 MG tablet Take 50 mg by mouth daily.     Multiple Vitamin (MULTIVITAMIN) tablet Take 1 tablet by mouth daily. Women's 50+     oxyCODONE-acetaminophen (PERCOCET) 10-325 MG tablet Take 1 tablet by mouth every 6 (six) hours as needed for pain. 30 tablet 0   Probiotic Product (VSL#3) CAPS Take 1 capsule by mouth daily. 30 capsule 11   Rimegepant Sulfate (NURTEC) 75 MG TBDP Take 75 mg by mouth daily as needed. For migraines. Take as close to onset of migraine as possible. One daily maximum. 32 tablet 0   hyoscyamine (LEVSIN SL) 0.125 MG SL tablet Place 0.125 mg under the tongue every 4 (four) hours as needed for cramping. (Patient not taking: Reported on 11/19/2021)     pantoprazole (PROTONIX) 40 MG tablet TAKE 1 TABLET (40 MG TOTAL) BY MOUTH DAILY. TAKE FOR 3 MONTHS. SCHEDULE OFFICE VISIT IN 2 MONTHS. (Patient not taking: Reported on 11/19/2021) 90 tablet 0    No current facility-administered medications for this visit.    Allergies as of 11/19/2021 - Review Complete 11/19/2021  Allergen Reaction Noted   Codeine Itching 04/15/2016   Latex Rash 04/15/2016    Family History  Problem Relation Age of Onset   Colon polyps Father    Heart disease Father    Diabetes Father  Diabetes Mother    Heart disease Mother    Heart disease Brother    Allergies Brother    Allergies Sister    Colon cancer Neg Hx    Rectal cancer Neg Hx    Stomach cancer Neg Hx    Esophageal cancer Neg Hx     Social History   Socioeconomic History   Marital status: Married    Spouse name: Not on file   Number of children: Not on file   Years of education: Not on file   Highest education level: Not on file  Occupational History   Not on file  Tobacco Use   Smoking status: Never   Smokeless tobacco: Never  Vaping Use   Vaping Use: Never used  Substance and Sexual Activity   Alcohol use: Yes    Comment: glass of wine "every now and then"   Drug use: No   Sexual activity: Not on file  Other Topics Concern   Not on file  Social History Narrative   Lives at home with her husband   Right handed   Caffeine: 2 cups per day   Social Determinants of Health   Financial Resource Strain: Not on file  Food Insecurity: Not on file  Transportation Needs: Not on file  Physical Activity: Not on file  Stress: Not on file  Social Connections: Not on file  Intimate Partner Violence: Not on file    Review of Systems:    Constitutional: No weight loss, fever or chills Cardiovascular: No chest pain Respiratory: No SOB Gastrointestinal: See HPI and otherwise negative   Physical Exam:  Vital signs: BP 112/64    Pulse 62    Ht 5\' 2"  (1.575 m)    Wt 116 lb 4 oz (52.7 kg)    SpO2 98%    BMI 21.26 kg/m    Constitutional:   Pleasant Elderly Caucasian female appears to be in NAD, Well developed, Well nourished, alert and cooperative Respiratory: Respirations  even and unlabored. Lungs clear to auscultation bilaterally.   No wheezes, crackles, or rhonchi.  Cardiovascular: Normal S1, S2. No MRG. Regular rate and rhythm. No peripheral edema, cyanosis or pallor.  Gastrointestinal:  Soft, nondistended, mild epigastric ttp. No rebound or guarding. Normal bowel sounds. No appreciable masses or hepatomegaly. Psychiatric: Demonstrates good judgement and reason without abnormal affect or behaviors.  No recent labs, see HPI for recent imaging.  Assessment: 1.  Gastroparesis: Currently only treated with diet, apparently failed Reglan in the past; likely the root of most of her problems 2.  Nausea and vomiting with epigastric pain: Likely with above 3.  History of SBO: Recent upper GI series with small bowel follow-through and no obstruction 4.  History of SIBO: Previously treated with Xifaxan with some relief; could consider retreatment in the future  Plan: 1.  At this time refill Pantoprazole 40 mg daily.  Would recommend the patient restart this as she did have some benefit from her epigastric pain and esophageal spasms.  She should take this 30-60 minutes before breakfast.  #30 with 5 refills 2.  Provided the patient with a copy of the gastroparesis diet and discussed some of this in detail.  Answered her questions. 3.  Prescribed Dicyclomine 10 mg 4 times daily, 20-30 minutes before meals and at bedtime.  #120 with 3 refills.  Discussed with patient that she would use this in the place of Hyoscyamine as needed.  She may not want to take it chronically given that she  already had the issues with being "dried out". 4.  Discussed current therapies for gastroparesis and that we are very limited.  Explained that I will discuss further with Dr. Silverio Decamp in regards to her trial of Reglan, not sure if she would benefit from retrying this or Erythromycin. 5.  Patient to follow in clinic with Dr. Silverio Decamp in the next 4 to 8 weeks.  Ellouise Newer, PA-C [facility] 11/19/2021, 1:33 PM  Cc: Haywood Pao, MD

## 2021-11-23 ENCOUNTER — Telehealth: Payer: Self-pay

## 2021-11-23 DIAGNOSIS — N390 Urinary tract infection, site not specified: Secondary | ICD-10-CM | POA: Diagnosis not present

## 2021-11-23 DIAGNOSIS — N3000 Acute cystitis without hematuria: Secondary | ICD-10-CM | POA: Diagnosis not present

## 2021-11-23 DIAGNOSIS — E1043 Type 1 diabetes mellitus with diabetic autonomic (poly)neuropathy: Secondary | ICD-10-CM | POA: Diagnosis not present

## 2021-11-23 MED ORDER — METOCLOPRAMIDE HCL 5 MG PO TABS: ORAL_TABLET | ORAL | 0 refills | Status: DC

## 2021-11-23 NOTE — Telephone Encounter (Signed)
Lm on vm for patient to return call.  RX for Reglan sent to pharmacy on file.

## 2021-11-23 NOTE — Telephone Encounter (Signed)
Pt returned call. She is aware that Dr. Lavon Paganini recommended a short trial of Reglan. Pt is aware that I have sent in a prescription for Reglan to her pharmacy on file. Pt verbalized understanding and had no concerns at the end of the call.

## 2021-11-23 NOTE — Telephone Encounter (Signed)
-----   Message from Unk Lightning, [place] sent at 11/23/2021  9:06 AM EST ----- Regarding: FW: Gastroparesis Please send in Reglan for the patient as below.  Thanks-JLL ----- Message ----- From: Napoleon Form, MD Sent: 11/23/2021   9:05 AM EST To: Unk Lightning, PA Subject: RE: Gastroparesis                              We can do a short term trial with low-dose Reglan 5 mg 3 times daily as needed.  I do not usually use it for long-term due to potential side effects associated with it.  Thank you ----- Message ----- From: Unk Lightning, PA Sent: 11/19/2021   2:04 PM EST To: Napoleon Form, MD Subject: Gastroparesis                                  Was not sure if she could trial erythromycin or Reglan again?  Thanks, JL L
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
## 2021-11-29 ENCOUNTER — Ambulatory Visit (INDEPENDENT_AMBULATORY_CARE_PROVIDER_SITE_OTHER): Payer: Medicare Other | Admitting: Family Medicine

## 2021-11-29 DIAGNOSIS — G43709 Chronic migraine without aura, not intractable, without status migrainosus: Secondary | ICD-10-CM

## 2021-11-29 NOTE — Progress Notes (Signed)
Botox- 200 units x 1 vial Lot: [id number] Expiration: 01/2024 NDC: 1093-2355-73  Bacteriostatic 0.9% Sodium Chloride- 50mL total Lot: [id number] Expiration: 05/04/2023 NDC: 2706-2376-28  Dx: B15.176 B/B

## 2021-12-01 ENCOUNTER — Ambulatory Visit: Payer: Medicare Other | Admitting: Gastroenterology

## 2021-12-02 ENCOUNTER — Encounter: Payer: Self-pay | Admitting: Family Medicine

## 2021-12-16 DIAGNOSIS — Z1339 Encounter for screening examination for other mental health and behavioral disorders: Secondary | ICD-10-CM | POA: Diagnosis not present

## 2021-12-16 DIAGNOSIS — G43711 Chronic migraine without aura, intractable, with status migrainosus: Secondary | ICD-10-CM | POA: Diagnosis not present

## 2021-12-16 DIAGNOSIS — E1043 Type 1 diabetes mellitus with diabetic autonomic (poly)neuropathy: Secondary | ICD-10-CM | POA: Diagnosis not present

## 2021-12-16 DIAGNOSIS — E1040 Type 1 diabetes mellitus with diabetic neuropathy, unspecified: Secondary | ICD-10-CM | POA: Diagnosis not present

## 2021-12-16 DIAGNOSIS — M810 Age-related osteoporosis without current pathological fracture: Secondary | ICD-10-CM | POA: Diagnosis not present

## 2021-12-16 DIAGNOSIS — Z794 Long term (current) use of insulin: Secondary | ICD-10-CM | POA: Diagnosis not present

## 2021-12-16 DIAGNOSIS — M5030 Other cervical disc degeneration, unspecified cervical region: Secondary | ICD-10-CM | POA: Diagnosis not present

## 2021-12-16 DIAGNOSIS — Z1331 Encounter for screening for depression: Secondary | ICD-10-CM | POA: Diagnosis not present

## 2021-12-16 DIAGNOSIS — I10 Essential (primary) hypertension: Secondary | ICD-10-CM | POA: Diagnosis not present

## 2021-12-16 DIAGNOSIS — Z4681 Encounter for fitting and adjustment of insulin pump: Secondary | ICD-10-CM | POA: Diagnosis not present

## 2021-12-16 DIAGNOSIS — K219 Gastro-esophageal reflux disease without esophagitis: Secondary | ICD-10-CM | POA: Diagnosis not present

## 2021-12-16 DIAGNOSIS — L209 Atopic dermatitis, unspecified: Secondary | ICD-10-CM | POA: Diagnosis not present

## 2021-12-16 DIAGNOSIS — F419 Anxiety disorder, unspecified: Secondary | ICD-10-CM | POA: Diagnosis not present

## 2021-12-16 DIAGNOSIS — J45991 Cough variant asthma: Secondary | ICD-10-CM | POA: Diagnosis not present

## 2021-12-29 DIAGNOSIS — M3503 Sicca syndrome with myopathy: Secondary | ICD-10-CM | POA: Diagnosis not present

## 2021-12-29 DIAGNOSIS — M26623 Arthralgia of bilateral temporomandibular joint: Secondary | ICD-10-CM | POA: Diagnosis not present

## 2022-01-17 ENCOUNTER — Ambulatory Visit (INDEPENDENT_AMBULATORY_CARE_PROVIDER_SITE_OTHER): Payer: Medicare Other | Admitting: Gastroenterology

## 2022-01-17 ENCOUNTER — Encounter: Payer: Self-pay | Admitting: Gastroenterology

## 2022-01-17 VITALS — BP 102/60 | HR 70 | Ht 62.0 in | Wt 115.0 lb

## 2022-01-17 DIAGNOSIS — R110 Nausea: Secondary | ICD-10-CM | POA: Diagnosis not present

## 2022-01-17 DIAGNOSIS — K5904 Chronic idiopathic constipation: Secondary | ICD-10-CM | POA: Diagnosis not present

## 2022-01-17 DIAGNOSIS — K219 Gastro-esophageal reflux disease without esophagitis: Secondary | ICD-10-CM

## 2022-01-17 DIAGNOSIS — K3184 Gastroparesis: Secondary | ICD-10-CM

## 2022-01-17 MED ORDER — PANTOPRAZOLE SODIUM 40 MG PO TBEC: 40.0000 mg | DELAYED_RELEASE_TABLET | Freq: Two times a day (BID) | ORAL | 3 refills | Status: DC

## 2022-01-17 MED ORDER — LINACLOTIDE 145 MCG PO CAPS: 145.0000 ug | ORAL_CAPSULE | Freq: Every day | ORAL | 3 refills | Status: DC

## 2022-01-17 NOTE — Patient Instructions (Addendum)
If you are age 70 or older, your body mass index should be between 23-30. Your Body mass index is 21.03 kg/m?Marland Kitchen If this is out of the aforementioned range listed, please consider follow up with your Primary Care Provider. ? ?If you are age 94 or younger, your body mass index should be between 19-25. Your Body mass index is 21.03 kg/m?Marland Kitchen If this is out of the aformentioned range listed, please consider follow up with your Primary Care Provider.  ? ?We have sent the following medications to your pharmacy for you to pick up at your convenience: ? ?Linzess 145 mcg , Pantoprazole 40 mg twice day ?________________________________________________________ ? ?The [facility] providers would like to encourage you to use [facility] to communicate with providers for non-urgent requests or questions.  Due to long hold times on the telephone, sending your provider a message by [facility] may be a faster and more efficient way to get a response.  Please allow 48 business hours for a response.  Please remember that this is for non-urgent requests.  ?_______________________________________________________ ? ? ?I appreciate the  opportunity to care for you ? ?Thank You  ? ?Marsa Aris , MD  ?

## 2022-01-17 NOTE — Progress Notes (Signed)
? ?       ? ?Taylor Adams [facility]    LO:[id number]    1951-06-10 ? ?Primary Care Physician:Tisovec, Fransico Him, MD ? ?Referring Physician: Haywood Pao, MD ?[address] ?[place],  [place] [address] ? ? ?Chief complaint: Nausea, abdominal cramping and discomfort ? ?HPI: ? ?71 year old very pleasant female here with complaints of nausea, vomiting, abdominal bloating with discomfort, excessive belching and intermittent diarrhea  ? ?She is experiencing worsening constipation despite taking daily MiraLAX ?Denies any rectal bleeding or melena ?  ?She continues to have intermittent symptoms some days are worse than others.  She did not notice any significant improvement after recent course of Xifaxan ?  ?EGD July 2022: Mild duodenitis otherwise unremarkable ?  ?Small bowel follow-through was negative for bowel obstruction. ? ?She was treated with Xifaxan in 2017 for small intestinal bacterial overgrowth and she has been relatively symptom-free for past few years until recent flare up.   ?  ?She is insulin-dependent diabetic, is currently on insulin pump overall her blood sugars are well controlled. ?Her symptoms started after she had salivary gland surgery in Feb 2022 at [facility] she has bilateral stents in her salivary ducts which she has to manipulate and clear almost daily. ?  ?GI Hx: ?  ?High-grade SBO in January 2015 , she underwent exploratory laparotomy with lysis of adhesions and ileocectomy.  ? In 2002 she was diagnosed with bacterial overgrowth on a breath Hydrogen test.   ?She has history of gastroparesis. She was on Reglan but it was discontinued in 2015 given no significant improvement  ?Patient had a colonoscopy in 2002 with removal of a tubular adenoma. A repeat colonoscopy in 2007 was normal. Recent colonoscopy in July 2017 was unremarkable as well except for sigmoid diverticulosis .  ?  ?Other past GI workup include  small bowel follow-through in 2005 was normal. An upper abdominal ultrasound in 2005 was  negative. Her celiac profile was normal in 2005. ? ? ?Outpatient Encounter Medications as of 01/17/2022  ?Medication Sig  ? ALPRAZolam (XANAX) 0.5 MG tablet Take 1 tablet (0.5 mg total) by mouth 2 (two) times daily as needed for anxiety.  ? B-D ULTRA-FINE 33 LANCETS MISC   ? BAYER CONTOUR NEXT TEST test strip   ? Biotin 5000 MCG TABS Take 5,000 mcg by mouth daily.   ? budesonide-formoterol (SYMBICORT) 80-4.5 MCG/ACT inhaler Inhale 2 puffs into the lungs 2 (two) times daily.  ? Cholecalciferol (VITAMIN D3) 1000 units CAPS Take 1,000 Units by mouth 2 (two) times daily.   ? Continuous Blood Gluc Sensor (DEXCOM G6 SENSOR) MISC USE AS DIRECTED & CHANGE SENSOR EVERY 30 DAYS  ? cyclobenzaprine (FLEXERIL) 10 MG tablet Take 10 mg by mouth as needed.  ? dicyclomine (BENTYL) 10 MG capsule Take 1 capsule (10 mg total) by mouth 4 (four) times daily as needed for spasms.  ? GLUCAGEN HYPOKIT 1 MG SOLR injection   ? hydrOXYzine (ATARAX/VISTARIL) 25 MG tablet Take 25 mg by mouth 3 (three) times daily as needed.  ? Insulin Human (INSULIN PUMP) 100 unit/ml SOLN Inject into the skin continuous. Novolog 100 unit/mL ?Basal Rate: ?12 am to 4:30 am  0.375 ?4:30 am to 8 am   0.45 ?8 am to 12 pm    0.525 ?12 pm to 3 pm  0.45 ?3 pm to 9 pm 0.475 ?9 pm to 12 am 0.425  ? losartan (COZAAR) 25 MG tablet Take 50 mg by mouth daily.  ? metoCLOPramide (REGLAN) 5 MG  tablet Take 1 tablet (5 mg total) by mouth three times a day as needed  ? Multiple Vitamin (MULTIVITAMIN) tablet Take 1 tablet by mouth daily. Women's 50+  ? oxyCODONE-acetaminophen (PERCOCET) 10-325 MG tablet Take 1 tablet by mouth every 6 (six) hours as needed for pain.  ? pantoprazole (PROTONIX) 40 MG tablet TAKE 1 TABLET (40 MG TOTAL) BY MOUTH DAILY. TAKE FOR 3 MONTHS. SCHEDULE OFFICE VISIT IN 2 MONTHS.  ? Probiotic Product (VSL#3) CAPS Take 1 capsule by mouth daily.  ? Rimegepant Sulfate (NURTEC) 75 MG TBDP Take 75 mg by mouth daily as needed. For migraines. Take as close to onset of  migraine as possible. One daily maximum.  ? ?No facility-administered encounter medications on file as of 01/17/2022.  ? ? ?Allergies as of 01/17/2022 - Review Complete 01/17/2022  ?Allergen Reaction Noted  ? Codeine Itching 04/15/2016  ? Latex Rash 04/15/2016  ? ? ?Past Medical History:  ?Diagnosis Date  ? Anxiety   ? Arthritis   ? Benign colon polyp   ? Cataracts, bilateral   ? Depression   ? Diabetes mellitus   ? Diarrhea   ? Gastroparesis   ? Headache   ? migraines  ? Intestinal bacterial overgrowth   ? Osteoporosis 2021  ? PONV (postoperative nausea and vomiting)   ? history of n/v  ? Salivary gland disorder   ? clogged salivary glands  ? SBO (small bowel obstruction) (HCC)   ? ? ?Past Surgical History:  ?Procedure Laterality Date  ? ABDOMINAL HYSTERECTOMY    ? APPENDECTOMY    ? BREAST EXCISIONAL BIOPSY Bilateral   ? benign  ? BREAST SURGERY    ? biopsy  ? CATARACT EXTRACTION Bilateral 2017  ? Dr. Vonna Kotyk  ? CATARACT EXTRACTION, BILATERAL    ? EYE SURGERY    ? ILEOCECETOMY  10/07/2013  ? Procedure: ILEOCECETOMY;  Surgeon: Axel Filler, MD;  Location: [facility] OR;  Service: General;;  ? LAPAROTOMY N/A 10/07/2013  ? Procedure: EXPLORATORY LAPAROTOMY;  Surgeon: Axel Filler, MD;  Location: [facility] OR;  Service: General;  Laterality: N/A;  ? LYSIS OF ADHESION  10/07/2013  ? Procedure: LYSIS OF ADHESION;  Surgeon: Axel Filler, MD;  Location: [facility] OR;  Service: General;;  ? NASAL SEPTOPLASTY W/ TURBINOPLASTY Bilateral 12/28/2016  ? Procedure: NASAL SEPTOPLASTY WITH  BILATARAL TURBINATE REDUCTION;  Surgeon: Newman Pies, MD;  Location: [facility] OR;  Service: ENT;  Laterality: Bilateral;  ? resection of lymph node in neck    ? ROTATOR CUFF REPAIR    ? right side  ? SALIVARY GLAND SURGERY  11/25/2020  ? SINUS ENDO WITH FUSION Bilateral 12/28/2016  ? Procedure: ENDOSCOPIC TOTAL ETHMOIDECTOMY,ENDOSCOPIC MAXILLARY ANTROSTOMY ;  Surgeon: Newman Pies, MD;  Location: [facility] OR;  Service: ENT;  Laterality: Bilateral;  ? YAG LASER APPLICATION Bilateral 2018   ? ? ?Family History  ?Problem Relation Age of Onset  ? Colon polyps Father   ? Heart disease Father   ? Diabetes Father   ? Diabetes Mother   ? Heart disease Mother   ? Heart disease Brother   ? Allergies Brother   ? Allergies Sister   ? Colon cancer Neg Hx   ? Rectal cancer Neg Hx   ? Stomach cancer Neg Hx   ? Esophageal cancer Neg Hx   ? ? ?Social History  ? ?Socioeconomic History  ? Marital status: Married  ?  Spouse name: Not on file  ? Number of children: Not on file  ? Years of  education: Not on file  ? Highest education level: Not on file  ?Occupational History  ? Not on file  ?Tobacco Use  ? Smoking status: Never  ? Smokeless tobacco: Never  ?Vaping Use  ? Vaping Use: Never used  ?Substance and Sexual Activity  ? Alcohol use: Yes  ?  Comment: glass of wine "every now and then"  ? Drug use: No  ? Sexual activity: Not on file  ?Other Topics Concern  ? Not on file  ?Social History Narrative  ? Lives at home with her husband  ? Right handed  ? Caffeine: 2 cups per day  ? ?Social Determinants of Health  ? ?Financial Resource Strain: Not on file  ?Food Insecurity: Not on file  ?Transportation Needs: Not on file  ?Physical Activity: Not on file  ?Stress: Not on file  ?Social Connections: Not on file  ?Intimate Partner Violence: Not on file  ? ? ? ? ?Review of systems: ?All other review of systems negative except as mentioned in the HPI. ? ? ?Physical Exam: ?Vitals:  ? 01/17/22 1435  ?BP: 102/60  ?Pulse: 70  ? ?Body mass index is 21.03 kg/m?. ?Gen:      No acute distress ?HEENT:  sclera anicteric ?Abd:      soft, non-tender; no palpable masses, no distension ?Ext:    No edema ?Neuro: alert and oriented x 3 ?Psych: normal mood and affect ? ?Data Reviewed: ? ?Reviewed labs, radiology imaging, old records and pertinent past GI work up ? ? ?Assessment and Plan/Recommendations: ? ?71 year old female status post appendectomy and abdominal hysterectomy, high-grade SBO in January 2015 status post ex-lap, ileo cecectomy and  lysis of adhesions, insulin-dependent diabetes here with complaints of nausea, vomiting, abdominal bloating, excessive belching and intermittent diarrhea ?Her symptoms are likely secondary to gastroparesis,

## 2022-01-24 ENCOUNTER — Encounter: Payer: Self-pay | Admitting: Gastroenterology

## 2022-02-07 ENCOUNTER — Encounter: Payer: Self-pay | Admitting: Gastroenterology

## 2022-02-13 ENCOUNTER — Other Ambulatory Visit: Payer: Self-pay | Admitting: Physician Assistant
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
## 2022-02-21 ENCOUNTER — Ambulatory Visit (INDEPENDENT_AMBULATORY_CARE_PROVIDER_SITE_OTHER): Payer: Medicare Other | Admitting: Family Medicine

## 2022-02-21 DIAGNOSIS — G43709 Chronic migraine without aura, not intractable, without status migrainosus: Secondary | ICD-10-CM

## 2022-02-21 NOTE — Progress Notes (Signed)
Botox- 200 units x 1 vial ?Lot: [id number] ?Expiration: 09/2024 ?NDC: 0023-3921-02 ? ?Bacteriostatic 0.9% Sodium Chloride- 4mL total ?Lot: [id number] ?Expiration: 05/04/2023 ?NDC: 0409-1966-02 ? ?Dx: G43.711 ?B/B  ?

## 2022-03-10 DIAGNOSIS — K1120 Sialoadenitis, unspecified: Secondary | ICD-10-CM | POA: Diagnosis not present

## 2022-03-10 DIAGNOSIS — Z792 Long term (current) use of antibiotics: Secondary | ICD-10-CM | POA: Diagnosis not present

## 2022-03-10 DIAGNOSIS — M3503 Sicca syndrome with myopathy: Secondary | ICD-10-CM | POA: Diagnosis not present

## 2022-03-10 DIAGNOSIS — M26623 Arthralgia of bilateral temporomandibular joint: Secondary | ICD-10-CM | POA: Diagnosis not present

## 2022-03-10 DIAGNOSIS — R519 Headache, unspecified: Secondary | ICD-10-CM | POA: Diagnosis not present

## 2022-03-10 DIAGNOSIS — R600 Localized edema: Secondary | ICD-10-CM | POA: Diagnosis not present

## 2022-03-10 DIAGNOSIS — Z885 Allergy status to narcotic agent status: Secondary | ICD-10-CM | POA: Diagnosis not present

## 2022-03-10 DIAGNOSIS — E109 Type 1 diabetes mellitus without complications: Secondary | ICD-10-CM | POA: Diagnosis not present

## 2022-03-10 DIAGNOSIS — K117 Disturbances of salivary secretion: Secondary | ICD-10-CM | POA: Diagnosis not present

## 2022-03-10 DIAGNOSIS — Z7951 Long term (current) use of inhaled steroids: Secondary | ICD-10-CM | POA: Diagnosis not present

## 2022-03-24 DIAGNOSIS — Z4681 Encounter for fitting and adjustment of insulin pump: Secondary | ICD-10-CM | POA: Diagnosis not present

## 2022-03-24 DIAGNOSIS — K219 Gastro-esophageal reflux disease without esophagitis: Secondary | ICD-10-CM | POA: Diagnosis not present

## 2022-03-24 DIAGNOSIS — M810 Age-related osteoporosis without current pathological fracture: Secondary | ICD-10-CM | POA: Diagnosis not present

## 2022-03-24 DIAGNOSIS — L209 Atopic dermatitis, unspecified: Secondary | ICD-10-CM | POA: Diagnosis not present

## 2022-03-24 DIAGNOSIS — G629 Polyneuropathy, unspecified: Secondary | ICD-10-CM | POA: Diagnosis not present

## 2022-03-24 DIAGNOSIS — E1040 Type 1 diabetes mellitus with diabetic neuropathy, unspecified: Secondary | ICD-10-CM | POA: Diagnosis not present

## 2022-03-24 DIAGNOSIS — M5030 Other cervical disc degeneration, unspecified cervical region: Secondary | ICD-10-CM | POA: Diagnosis not present

## 2022-03-24 DIAGNOSIS — I10 Essential (primary) hypertension: Secondary | ICD-10-CM | POA: Diagnosis not present

## 2022-03-24 DIAGNOSIS — E1043 Type 1 diabetes mellitus with diabetic autonomic (poly)neuropathy: Secondary | ICD-10-CM | POA: Diagnosis not present

## 2022-03-24 DIAGNOSIS — G43711 Chronic migraine without aura, intractable, with status migrainosus: Secondary | ICD-10-CM | POA: Diagnosis not present

## 2022-03-24 DIAGNOSIS — J45991 Cough variant asthma: Secondary | ICD-10-CM | POA: Diagnosis not present

## 2022-03-24 DIAGNOSIS — Z794 Long term (current) use of insulin: Secondary | ICD-10-CM | POA: Diagnosis not present

## 2022-04-11 ENCOUNTER — Ambulatory Visit (INDEPENDENT_AMBULATORY_CARE_PROVIDER_SITE_OTHER): Payer: Medicare Other | Admitting: Gastroenterology

## 2022-04-11 ENCOUNTER — Encounter: Payer: Self-pay | Admitting: Gastroenterology

## 2022-04-11 VITALS — BP 114/62 | HR 76 | Ht 62.0 in | Wt 113.1 lb

## 2022-04-11 DIAGNOSIS — R109 Unspecified abdominal pain: Secondary | ICD-10-CM

## 2022-04-11 DIAGNOSIS — R110 Nausea: Secondary | ICD-10-CM | POA: Diagnosis not present

## 2022-04-11 DIAGNOSIS — K3184 Gastroparesis: Secondary | ICD-10-CM

## 2022-04-11 DIAGNOSIS — R1110 Vomiting, unspecified: Secondary | ICD-10-CM

## 2022-04-11 MED ORDER — METOCLOPRAMIDE HCL 5 MG PO TABS: 5.0000 mg | ORAL_TABLET | Freq: Four times a day (QID) | ORAL | 1 refills | Status: DC

## 2022-04-11 NOTE — Patient Instructions (Signed)
We have sent the following medications to your pharmacy for you to pick up at your convenience:  Reglan  Linzess  Continue to eat small frequent meals  Follow up in 3 months  If you are age 70 or older, your body mass index should be between 23-30. Your Body mass index is 20.69 kg/m. If this is out of the aforementioned range listed, please consider follow up with your Primary Care Provider.  If you are age 69 or younger, your body mass index should be between 19-25. Your Body mass index is 20.69 kg/m. If this is out of the aformentioned range listed, please consider follow up with your Primary Care Provider.   ________________________________________________________  The [facility] providers would like to encourage you to use [facility] to communicate with providers for non-urgent requests or questions.  Due to long hold times on the telephone, sending your provider a message by [facility] may be a faster and more efficient way to get a response.  Please allow 48 business hours for a response.  Please remember that this is for non-urgent requests.  _______________________________________________________    I appreciate the  opportunity to care for you  Thank You   Marsa Aris , MD

## 2022-04-11 NOTE — Progress Notes (Signed)
Taylor Adams [facility]    [id number]    [birth year]  Primary Care Physician:Tisovec, Adelfa Koh, MD  Referring Physician: Gaspar Garbe, MD [address] [place],  [place] [address]   Chief complaint:  Nausea, gastroparesis  HPI:  71 year old very pleasant female here for follow up of gastroparesis. She continues to have intermittent nausea, abdominal bloating with discomfort, and excessive belching but overall she feels symptoms are getting more manageable.   Currently on antibiotic for UTI She has modified her diet and is tolerating certain foods better.  Bowel habits are overall improved on Linzess.  She is able to manage her symptoms well.  No longer losing weight     EGD July 2022: Mild duodenitis otherwise unremarkable   Small bowel follow-through was negative for bowel obstruction.   She was treated with Xifaxan in 2017 for small intestinal bacterial overgrowth and she has been relatively symptom-free for past few years until recent flare up.     She is insulin-dependent diabetic, is currently on insulin pump overall her blood sugars are well controlled. Her symptoms started after she had salivary gland surgery in Feb 2022 at [facility] she has bilateral stents in her salivary ducts which she has to manipulate and clear almost daily.   GI Hx:   High-grade SBO in January 2015 , she underwent exploratory laparotomy with lysis of adhesions and ileocectomy.   In 2002 she was diagnosed with bacterial overgrowth on a breath Hydrogen test.   She has history of gastroparesis. She was on Reglan but it was discontinued in 2015 given no significant improvement  Patient had a colonoscopy in 2002 with removal of a tubular adenoma. A repeat colonoscopy in 2007 was normal. Recent colonoscopy in July 2017 was unremarkable as well except for sigmoid diverticulosis .    Other past GI workup include  small bowel follow-through in 2005 was normal. An upper abdominal ultrasound in 2005 was  negative. Her celiac profile was normal in 2005.   Outpatient Encounter Medications as of 04/11/2022  Medication Sig   ALPRAZolam (XANAX) 0.5 MG tablet Take 1 tablet (0.5 mg total) by mouth 2 (two) times daily as needed for anxiety.   B-D ULTRA-FINE 33 LANCETS MISC    BAYER CONTOUR NEXT TEST test strip    Biotin 5000 MCG TABS Take 5,000 mcg by mouth daily.    budesonide-formoterol (SYMBICORT) 80-4.5 MCG/ACT inhaler Inhale 2 puffs into the lungs 2 (two) times daily.   Cholecalciferol (VITAMIN D3) 1000 units CAPS Take 1,000 Units by mouth 2 (two) times daily.    Continuous Blood Gluc Sensor (DEXCOM G6 SENSOR) MISC USE AS DIRECTED & CHANGE SENSOR EVERY 30 DAYS   cyclobenzaprine (FLEXERIL) 10 MG tablet Take 10 mg by mouth as needed.   GLUCAGEN HYPOKIT 1 MG SOLR injection    hydrOXYzine (ATARAX/VISTARIL) 25 MG tablet Take 25 mg by mouth 3 (three) times daily as needed.   Insulin Human (INSULIN PUMP) 100 unit/ml SOLN Inject into the skin continuous. Novolog 100 unit/mL Basal Rate: 12 am to 4:30 am  0.375 4:30 am to 8 am   0.45 8 am to 12 pm    0.525 12 pm to 3 pm  0.45 3 pm to 9 pm 0.475 9 pm to 12 am 0.425   linaclotide (LINZESS) 145 MCG CAPS capsule Take 1 capsule (145 mcg total) by mouth daily before breakfast.   losartan (COZAAR) 25 MG tablet Take 50 mg by mouth daily.  Multiple Vitamin (MULTIVITAMIN) tablet Take 1 tablet by mouth daily. Women's 50+   oxyCODONE-acetaminophen (PERCOCET) 10-325 MG tablet Take 1 tablet by mouth every 6 (six) hours as needed for pain.   pantoprazole (PROTONIX) 40 MG tablet Take 1 tablet (40 mg total) by mouth 2 (two) times daily. Take for 3 months.  Schedule office visit in 2 months.   Probiotic Product (VSL#3) CAPS Take 1 capsule by mouth daily.   Rimegepant Sulfate (NURTEC) 75 MG TBDP Take 75 mg by mouth daily as needed. For migraines. Take as close to onset of migraine as possible. One daily maximum.   No facility-administered encounter medications on  file as of 04/11/2022.    Allergies as of 04/11/2022 - Review Complete 01/24/2022  Allergen Reaction Noted   Codeine Itching 04/15/2016   Latex Rash 04/15/2016    Past Medical History:  Diagnosis Date   Anxiety    Arthritis    Benign colon polyp    Cataracts, bilateral    Depression    Diabetes mellitus    Diarrhea    Gastroparesis    Headache    migraines   Intestinal bacterial overgrowth    Osteoporosis 2021   PONV (postoperative nausea and vomiting)    history of n/v   Salivary gland disorder    clogged salivary glands   SBO (small bowel obstruction) (HCC)     Past Surgical History:  Procedure Laterality Date   ABDOMINAL HYSTERECTOMY     APPENDECTOMY     BREAST EXCISIONAL BIOPSY Bilateral    benign   BREAST SURGERY     biopsy   CATARACT EXTRACTION Bilateral 2017   Dr. Vonna Kotyk   CATARACT EXTRACTION, BILATERAL     EYE SURGERY     ILEOCECETOMY  10/07/2013   Procedure: Ferne Coe;  Surgeon: Axel Filler, MD;  Location: [facility] OR;  Service: General;;   LAPAROTOMY N/A 10/07/2013   Procedure: EXPLORATORY LAPAROTOMY;  Surgeon: Axel Filler, MD;  Location: [facility] OR;  Service: General;  Laterality: N/A;   LYSIS OF ADHESION  10/07/2013   Procedure: LYSIS OF ADHESION;  Surgeon: Axel Filler, MD;  Location: [facility] OR;  Service: General;;   NASAL SEPTOPLASTY W/ TURBINOPLASTY Bilateral 12/28/2016   Procedure: NASAL SEPTOPLASTY WITH  BILATARAL TURBINATE REDUCTION;  Surgeon: Newman Pies, MD;  Location: [facility] OR;  Service: ENT;  Laterality: Bilateral;   resection of lymph node in neck     ROTATOR CUFF REPAIR     right side   SALIVARY GLAND SURGERY  11/25/2020   SINUS ENDO WITH FUSION Bilateral 12/28/2016   Procedure: ENDOSCOPIC TOTAL ETHMOIDECTOMY,ENDOSCOPIC MAXILLARY ANTROSTOMY ;  Surgeon: Newman Pies, MD;  Location: [facility] OR;  Service: ENT;  Laterality: Bilateral;   YAG LASER APPLICATION Bilateral 2018    Family History  Problem Relation Age of Onset   Colon polyps Father    Heart disease  Father    Diabetes Father    Diabetes Mother    Heart disease Mother    Heart disease Brother    Allergies Brother    Allergies Sister    Colon cancer Neg Hx    Rectal cancer Neg Hx    Stomach cancer Neg Hx    Esophageal cancer Neg Hx     Social History   Socioeconomic History   Marital status: Married    Spouse name: Not on file   Number of children: Not on file   Years of education: Not on file   Highest education level: Not on file  Occupational History   Not on file  Tobacco Use   Smoking status: Never   Smokeless tobacco: Never  Vaping Use   Vaping Use: Never used  Substance and Sexual Activity   Alcohol use: Yes    Comment: glass of wine "every now and then"   Drug use: No   Sexual activity: Not on file  Other Topics Concern   Not on file  Social History Narrative   Lives at home with her husband   Right handed   Caffeine: 2 cups per day   Social Determinants of Health   Financial Resource Strain: Not on file  Food Insecurity: Not on file  Transportation Needs: Not on file  Physical Activity: Not on file  Stress: Not on file  Social Connections: Not on file  Intimate Partner Violence: Not on file      Review of systems: All other review of systems negative except as mentioned in the HPI.   Physical Exam: There were no vitals filed for this visit. There is no height or weight on file to calculate BMI. Gen:      No acute distress HEENT:  sclera anicteric Abd:      soft, non-tender; no palpable masses, no distension Ext:    No edema Neuro: alert and oriented x 3 Psych: normal mood and affect  Data Reviewed:  Reviewed labs, radiology imaging, old records and pertinent past GI work up   Assessment and Plan/Recommendations:  71 year old female status post appendectomy and abdominal hysterectomy, high-grade SBO in January 2015 status post ex-lap, ileo cecectomy and lysis of adhesions, insulin-dependent diabetes here with complaints of nausea,  vomiting, abdominal bloating, excessive belching and intermittent diarrhea Her symptoms are likely secondary to gastroparesis, she is reluctant to use any promotility agents due to potential side effects Continue with small frequent meals.  Advised patient to to continue to reintroduce the food she has been avoiding  She is not interested in gastric stimulator, would like to avoid any invasive procedures   Chronic idiopathic constipation : Continue Linzess 145 mcg daily   GERD: Use pantoprazole 40 mg twice daily Antireflux measures   Return in 3 months or sooner if needed  This visit required >30 minutes of patient care (this includes precharting, chart review, review of results, face-to-face time used for counseling as well as treatment plan and follow-up. The patient was provided an opportunity to ask questions and all were answered. The patient agreed with the plan and demonstrated an understanding of the instructions.  Iona Beard , MD    CC: Tisovec, Adelfa Koh, MD
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
## 2022-05-18 ENCOUNTER — Ambulatory Visit (INDEPENDENT_AMBULATORY_CARE_PROVIDER_SITE_OTHER): Payer: Medicare Other | Admitting: Family Medicine

## 2022-05-18 ENCOUNTER — Encounter: Payer: Self-pay | Admitting: Family Medicine

## 2022-05-18 DIAGNOSIS — G43709 Chronic migraine without aura, not intractable, without status migrainosus: Secondary | ICD-10-CM | POA: Diagnosis not present

## 2022-05-18 MED ORDER — ONABOTULINUMTOXINA 200 UNITS IJ SOLR
155.0000 [IU] | Freq: Once | INTRAMUSCULAR | Status: AC
  Administered 2022-05-18: 155 [IU] via INTRAMUSCULAR

## 2022-05-18 NOTE — Progress Notes (Signed)
Botox- 200 units x 1 vial Lot: [id number] Expiration: 11/2024 NDC: 8016-5537-48  Bacteriostatic 0.9% Sodium Chloride- 27mL total Lot: [id number] Expiration: 06/04/2023 NDC: 7544-9201-00  Dx: F12.197 B/B

## 2022-05-27 ENCOUNTER — Other Ambulatory Visit: Payer: Self-pay | Admitting: Gastroenterology

## 2022-06-21 DIAGNOSIS — Z79899 Other long term (current) drug therapy: Secondary | ICD-10-CM | POA: Diagnosis not present

## 2022-06-21 DIAGNOSIS — G43711 Chronic migraine without aura, intractable, with status migrainosus: Secondary | ICD-10-CM | POA: Diagnosis not present

## 2022-06-21 DIAGNOSIS — M26623 Arthralgia of bilateral temporomandibular joint: Secondary | ICD-10-CM | POA: Diagnosis not present

## 2022-06-21 DIAGNOSIS — K3184 Gastroparesis: Secondary | ICD-10-CM | POA: Diagnosis not present

## 2022-06-21 DIAGNOSIS — M3500 Sicca syndrome, unspecified: Secondary | ICD-10-CM | POA: Diagnosis not present

## 2022-06-21 DIAGNOSIS — M8580 Other specified disorders of bone density and structure, unspecified site: Secondary | ICD-10-CM | POA: Diagnosis not present

## 2022-06-21 DIAGNOSIS — Z885 Allergy status to narcotic agent status: Secondary | ICD-10-CM | POA: Diagnosis not present

## 2022-06-21 DIAGNOSIS — J45991 Cough variant asthma: Secondary | ICD-10-CM | POA: Diagnosis not present

## 2022-06-21 DIAGNOSIS — K1120 Sialoadenitis, unspecified: Secondary | ICD-10-CM | POA: Diagnosis not present

## 2022-06-21 DIAGNOSIS — R519 Headache, unspecified: Secondary | ICD-10-CM | POA: Diagnosis not present

## 2022-06-21 DIAGNOSIS — Z7951 Long term (current) use of inhaled steroids: Secondary | ICD-10-CM | POA: Diagnosis not present

## 2022-06-21 DIAGNOSIS — E1043 Type 1 diabetes mellitus with diabetic autonomic (poly)neuropathy: Secondary | ICD-10-CM | POA: Diagnosis not present

## 2022-06-21 DIAGNOSIS — I10 Essential (primary) hypertension: Secondary | ICD-10-CM | POA: Diagnosis not present

## 2022-06-21 DIAGNOSIS — Z91040 Latex allergy status: Secondary | ICD-10-CM | POA: Diagnosis not present

## 2022-06-21 DIAGNOSIS — K117 Disturbances of salivary secretion: Secondary | ICD-10-CM | POA: Diagnosis not present

## 2022-06-27 DIAGNOSIS — G629 Polyneuropathy, unspecified: Secondary | ICD-10-CM | POA: Diagnosis not present

## 2022-06-27 DIAGNOSIS — I7300 Raynaud's syndrome without gangrene: Secondary | ICD-10-CM | POA: Diagnosis not present

## 2022-06-27 DIAGNOSIS — F419 Anxiety disorder, unspecified: Secondary | ICD-10-CM | POA: Diagnosis not present

## 2022-06-27 DIAGNOSIS — G43711 Chronic migraine without aura, intractable, with status migrainosus: Secondary | ICD-10-CM | POA: Diagnosis not present

## 2022-06-27 DIAGNOSIS — M810 Age-related osteoporosis without current pathological fracture: Secondary | ICD-10-CM | POA: Diagnosis not present

## 2022-06-27 DIAGNOSIS — I10 Essential (primary) hypertension: Secondary | ICD-10-CM | POA: Diagnosis not present

## 2022-06-27 DIAGNOSIS — Z4681 Encounter for fitting and adjustment of insulin pump: Secondary | ICD-10-CM | POA: Diagnosis not present

## 2022-06-27 DIAGNOSIS — L209 Atopic dermatitis, unspecified: Secondary | ICD-10-CM | POA: Diagnosis not present

## 2022-06-27 DIAGNOSIS — M5030 Other cervical disc degeneration, unspecified cervical region: Secondary | ICD-10-CM | POA: Diagnosis not present

## 2022-06-27 DIAGNOSIS — E1043 Type 1 diabetes mellitus with diabetic autonomic (poly)neuropathy: Secondary | ICD-10-CM | POA: Diagnosis not present

## 2022-06-27 DIAGNOSIS — Z794 Long term (current) use of insulin: Secondary | ICD-10-CM | POA: Diagnosis not present

## 2022-06-27 DIAGNOSIS — E1040 Type 1 diabetes mellitus with diabetic neuropathy, unspecified: Secondary | ICD-10-CM | POA: Diagnosis not present

## 2022-07-05 ENCOUNTER — Ambulatory Visit: Payer: Medicare Other | Admitting: Gastroenterology

## 2022-07-05 ENCOUNTER — Encounter (INDEPENDENT_AMBULATORY_CARE_PROVIDER_SITE_OTHER): Payer: Medicare Other | Admitting: Ophthalmology

## 2022-07-05 DIAGNOSIS — H35033 Hypertensive retinopathy, bilateral: Secondary | ICD-10-CM

## 2022-07-05 DIAGNOSIS — Z8669 Personal history of other diseases of the nervous system and sense organs: Secondary | ICD-10-CM

## 2022-07-05 DIAGNOSIS — H539 Unspecified visual disturbance: Secondary | ICD-10-CM

## 2022-07-05 DIAGNOSIS — Z961 Presence of intraocular lens: Secondary | ICD-10-CM

## 2022-07-05 DIAGNOSIS — E103293 Type 1 diabetes mellitus with mild nonproliferative diabetic retinopathy without macular edema, bilateral: Secondary | ICD-10-CM

## 2022-07-05 DIAGNOSIS — G43711 Chronic migraine without aura, intractable, with status migrainosus: Secondary | ICD-10-CM

## 2022-07-05 DIAGNOSIS — I10 Essential (primary) hypertension: Secondary | ICD-10-CM

## 2022-07-08 ENCOUNTER — Ambulatory Visit: Payer: Medicare Other | Admitting: Gastroenterology

## 2022-07-26 NOTE — Progress Notes (Signed)
[facility] - Clinic Note  08/09/2022     CHIEF COMPLAINT Patient presents for Retina Evaluation    HISTORY OF PRESENT ILLNESS: Taylor Adams is a 71 y.o. female who presents to the clinic today for:   HPI     Retina Evaluation   In both eyes.  This started 1 year ago.  Duration of 1 year.  I, the attending physician,  performed the HPI with the patient and updated documentation appropriately.        Comments   12 month retina eval pt states she has good glycemic control with her last A1C 6.0 in September 2023 her blood sugar today 180 after eating this morning she is still having floaters denies and flashes of light       Last edited by Rennis ChrisZamora, Jaria Conway, MD on 08/09/2022  1:34 PM.    pt states she is not having any problems other than dryness, she uses Systane, which helps relieve the symptoms, she sees occasional floaters, pt states she has gastroparesis and is very limited to what she can eat, she takes Linzess every morning  Referring physician: Gaspar Garbeisovec, Richard W, MD [address] [place] [id number]  HISTORICAL INFORMATION:  Selected notes from the MEDICAL RECORD NUMBER Referred by self for DM exam LEE:  Ocular Hx- PMH-   CURRENT MEDICATIONS: No current outpatient medications on file. (Ophthalmic Drugs)   No current facility-administered medications for this visit. (Ophthalmic Drugs)   Current Outpatient Medications (Other)  Medication Sig   ALPRAZolam (XANAX) 0.5 MG tablet Take 1 tablet (0.5 mg total) by mouth 2 (two) times daily as needed for anxiety.   B-D ULTRA-FINE 33 LANCETS MISC    BAYER CONTOUR NEXT TEST test strip    Biotin 5000 MCG TABS Take 5,000 mcg by mouth daily.    budesonide-formoterol (SYMBICORT) 80-4.5 MCG/ACT inhaler Inhale 2 puffs into the lungs 2 (two) times daily.   Cholecalciferol (VITAMIN D3) 1000 units CAPS Take 1,000 Units by mouth 2 (two) times daily.    Continuous Blood Gluc Sensor (DEXCOM G6 SENSOR) MISC  USE AS DIRECTED & CHANGE SENSOR EVERY 30 DAYS   cyclobenzaprine (FLEXERIL) 10 MG tablet Take 10 mg by mouth as needed.   GLUCAGEN HYPOKIT 1 MG SOLR injection    hydrOXYzine (ATARAX/VISTARIL) 25 MG tablet Take 25 mg by mouth 3 (three) times daily as needed.   Insulin Human (INSULIN PUMP) 100 unit/ml SOLN Inject into the skin continuous. Novolog 100 unit/mL Basal Rate: 12 am to 4:30 am  0.375 4:30 am to 8 am   0.45 8 am to 12 pm    0.525 12 pm to 3 pm  0.45 3 pm to 9 pm 0.475 9 pm to 12 am 0.425   LINZESS 145 MCG CAPS capsule TAKE ONE CAPSULE BY MOUTH DAILY BEFORE BREAKFAST   losartan (COZAAR) 50 MG tablet Take 50 mg by mouth daily.   metoCLOPramide (REGLAN) 5 MG tablet Take 1 tablet (5 mg total) by mouth 4 (four) times daily.   Multiple Vitamin (MULTIVITAMIN) tablet Take 1 tablet by mouth daily. Women's 50+   oxyCODONE-acetaminophen (PERCOCET) 10-325 MG tablet Take 1 tablet by mouth every 6 (six) hours as needed for pain.   pantoprazole (PROTONIX) 40 MG tablet TAKE ONE TABLET BY MOUTH TWICE A DAY *TAKE FOR 3 MONTHS AND SCHEDULE AN OFFICE VISIT IN 2 MONTHS   Probiotic Product (VSL#3) CAPS Take 1 capsule by mouth daily.   Rimegepant Sulfate (NURTEC) 75  MG TBDP Take 75 mg by mouth daily as needed. For migraines. Take as close to onset of migraine as possible. One daily maximum.   No current facility-administered medications for this visit. (Other)   REVIEW OF SYSTEMS: ROS   Positive for: Gastrointestinal, Endocrine Negative for: Constitutional, Neurological, Skin, Genitourinary, Musculoskeletal, HENT, Cardiovascular, Eyes, Respiratory, Psychiatric, Allergic/Imm, Heme/Lymph Last edited by Bernarda Caffey, MD on 08/09/2022  1:34 PM.     ALLERGIES Allergies  Allergen Reactions   Codeine Itching    Pt denies this is an allergy   Latex Rash    PAST MEDICAL HISTORY Past Medical History:  Diagnosis Date   Anxiety    Arthritis    Benign colon polyp    Cataracts, bilateral    Depression     Diabetes mellitus    Diarrhea    Gastroparesis    Headache    migraines   Intestinal bacterial overgrowth    Osteoporosis 2021   PONV (postoperative nausea and vomiting)    history of n/v   Salivary gland disorder    clogged salivary glands   SBO (small bowel obstruction) ([place])    Past Surgical History:  Procedure Laterality Date   ABDOMINAL HYSTERECTOMY     APPENDECTOMY     BREAST EXCISIONAL BIOPSY Bilateral    benign   BREAST SURGERY     biopsy   CATARACT EXTRACTION Bilateral 2017   Dr. Talbert Forest   CATARACT EXTRACTION, BILATERAL     EYE SURGERY     ILEOCECETOMY  10/07/2013   Procedure: Pennie Rushing;  Surgeon: Ralene Ok, MD;  Location: [facility];  Service: General;;   LAPAROTOMY N/A 10/07/2013   Procedure: EXPLORATORY LAPAROTOMY;  Surgeon: Ralene Ok, MD;  Location: [facility];  Service: General;  Laterality: N/A;   LYSIS OF ADHESION  10/07/2013   Procedure: LYSIS OF ADHESION;  Surgeon: Ralene Ok, MD;  Location: [facility];  Service: General;;   NASAL SEPTOPLASTY W/ TURBINOPLASTY Bilateral 12/28/2016   Procedure: NASAL SEPTOPLASTY WITH  BILATARAL TURBINATE REDUCTION;  Surgeon: Leta Baptist, MD;  Location: [facility];  Service: ENT;  Laterality: Bilateral;   resection of lymph node in neck     ROTATOR CUFF REPAIR     right side   SALIVARY GLAND SURGERY  11/25/2020   SINUS ENDO WITH FUSION Bilateral 12/28/2016   Procedure: ENDOSCOPIC TOTAL ETHMOIDECTOMY,ENDOSCOPIC MAXILLARY ANTROSTOMY ;  Surgeon: Leta Baptist, MD;  Location: [facility];  Service: ENT;  Laterality: Bilateral;   YAG LASER APPLICATION Bilateral 6834   FAMILY HISTORY Family History  Problem Relation Age of Onset   Colon polyps Father    Heart disease Father    Diabetes Father    Diabetes Mother    Heart disease Mother    Heart disease Brother    Allergies Brother    Allergies Sister    Colon cancer Neg Hx    Rectal cancer Neg Hx    Stomach cancer Neg Hx    Esophageal cancer Neg Hx    SOCIAL HISTORY Social History    Tobacco Use   Smoking status: Never   Smokeless tobacco: Never  Vaping Use   Vaping Use: Never used  Substance Use Topics   Alcohol use: Yes    Comment: glass of wine "every now and then"   Drug use: No       OPHTHALMIC EXAM: Base Eye Exam     Visual Acuity (Snellen - Linear)       Right Left   Dist Frederic 20/25  20/20   Dist ph [place] NI          Tonometry (Tonopen, 8:28 AM)       Right Left   Pressure 15 14         Pupils       Pupils Dark Light Shape React APD   Right PERRL 3 2 Round Brisk None   Left PERRL 3 2 Round Brisk None         Visual Fields       Left Right    Full Full         Extraocular Movement       Right Left    Full, Ortho Full, Ortho         Neuro/Psych     Oriented x3: Yes   Mood/Affect: Normal         Dilation     Both eyes: 2.5% Phenylephrine @ 8:28 AM           Slit Lamp and Fundus Exam     Slit Lamp Exam       Right Left   Lids/Lashes Dermatochalasis - upper lid Dermatochalasis - upper lid, mild Meibomian gland dysfunction   Conjunctiva/Sclera White and quiet White and quiet   Cornea Well healed temporal cataract wounds, trace Punctate epithelial erosions, arcus Well healed temporal cataract wounds, trace Punctate epithelial erosions, trace EBMD   Anterior Chamber deep and clear Deep and quiet   Iris Round and dilated, No NVI Round and dilated, No NVI   Lens PC IOL in good position, trace Posterior capsular opacification PC IOL in good position with open PC   Anterior Vitreous Mild syneresis, vitreous condensations Vitreous syneresis, Posterior vitreous detachment, vitreous condensations         Fundus Exam       Right Left   Disc Compact, mild temporal Peripapillary atrophy, Pink and Sharp Compact, mild tilt, temporal PPA, Pink and Sharp   C/D Ratio 0.2 0.2   Macula Flat, Blunted foveal reflex, mild Retinal pigment epithelial mottling, No heme or edema Flat, blunted foveal reflex, mild Retinal pigment  epithelial mottling, No heme or edema   Vessels attenuated, Tortuous mild attenuation, mild tortuosity   Periphery Attached, rare MA, mild pigmented cystoid degeneration Attached, rare MA greatest temporal periphery, mild pigmented cystoid degeneration           IMAGING AND PROCEDURES  Imaging and Procedures for @TODAY @  OCT, Retina - OU - Both Eyes       Right Eye Quality was good. Central Foveal Thickness: 297. Progression has been stable. Findings include normal foveal contour, no IRF, no SRF.   Left Eye Quality was good. Central Foveal Thickness: 297. Progression has been stable. Findings include normal foveal contour, no IRF, no SRF.   Notes *Images captured and stored on drive  Diagnosis / Impression:  NFP, no IRF/SRF OU No DME OU  Clinical management:  See below  Abbreviations: NFP - Normal foveal profile. CME - cystoid macular edema. PED - pigment epithelial detachment. IRF - intraretinal fluid. SRF - subretinal fluid. EZ - ellipsoid zone. ERM - epiretinal membrane. ORA - outer retinal atrophy. ORT - outer retinal tubulation. SRHM - subretinal hyper-reflective material            ASSESSMENT/PLAN:    ICD-10-CM   1. Mild nonproliferative diabetic retinopathy of both eyes without macular edema associated with type 1 diabetes mellitus (HCC)  E10.3293 OCT, Retina - OU - Both Eyes  2. Essential hypertension  I10     3. Hypertensive retinopathy of both eyes  H35.033     4. Pseudophakia of both eyes  Z96.1     5. History of myopia  Z86.69     6. Intractable chronic migraine without aura and with status migrainosus  G43.711     7. Visual disturbances  H53.9      1,2. Diabetes mellitus, type 1 with mild nonproliferative diabetic retinopathy OU -- stable  - last A1c was 6.0 on 09.23.23  - exam shows just mild peripheral MA OU, no DME or MA in maculae OU  - The incidence, risk factors for progression, natural history and treatment options for diabetic  retinopathy  were discussed with patient.    - The need for close monitoring of blood glucose, blood pressure, and serum lipids, avoiding cigarette or any type of tobacco, and the need for long term follow up was also discussed with patient.  - f/u 1 year, sooner prn  3,4. Hypertensive retinopathy OU  - discussed importance of tight BP control  - continue to monitor  5. Pseudophakia OU  - s/p CE/IOL OU ([place])  - s/p YAG cap OS ([place])  - beautiful surgeries, doing well  - continue to monitor  6. History of Myopia  - high myopia prior to cataract surgery  - myopic contour noted on OCT  7. Migraine with visual disturbances OS  - pt with history of intractable migraine w/ complaint of "fog/film" in left visual field  - following closely with Neurology  - nothing on dilated eye exam to correlate visual disturbance and on our objective testing is seeing 20/20 and no significant ocular findings  - recommend continued management with Neurology  Ophthalmic Meds Ordered this visit:  No orders of the defined types were placed in this encounter.     Return in about 1 year (around 08/10/2023) for f/u DM exam, DFE, OCT.  There are no Patient Instructions on file for this visit.  This document serves as a record of services personally performed by Karie Chimera, MD, PhD. It was created on their behalf by Gerilyn Nestle, COT an ophthalmic technician. The creation of this record is the provider's dictation and/or activities during the visit.    Electronically signed by:  Gerilyn Nestle, COT  10.24.23 1:34 PM  This document serves as a record of services personally performed by Karie Chimera, MD, PhD. It was created on their behalf by Glee Arvin. Manson Passey, OA an ophthalmic technician. The creation of this record is the provider's dictation and/or activities during the visit.    Electronically signed by: Glee Arvin. Manson Passey, [place] 10.07.2023 1:34 PM  Karie Chimera, M.D., Ph.D. Diseases &  Surgery of the Retina and Vitreous [facility]  I have reviewed the above documentation for accuracy and completeness, and I agree with the above. Karie Chimera, M.D., Ph.D. 08/09/22 1:35 PM   Abbreviations: M myopia (nearsighted); A astigmatism; H hyperopia (farsighted); P presbyopia; Mrx spectacle prescription;  CTL contact lenses; OD right eye; OS left eye; OU both eyes  XT exotropia; ET esotropia; PEK punctate epithelial keratitis; PEE punctate epithelial erosions; DES dry eye syndrome; MGD meibomian gland dysfunction; ATs artificial tears; PFAT's preservative free artificial tears; NSC nuclear sclerotic cataract; PSC posterior subcapsular cataract; ERM epi-retinal membrane; PVD posterior vitreous detachment; RD retinal detachment; DM diabetes mellitus; DR diabetic retinopathy; NPDR non-proliferative diabetic retinopathy; PDR proliferative diabetic retinopathy; CSME clinically significant macular edema;  DME diabetic macular edema; dbh dot blot hemorrhages; CWS cotton wool spot; POAG primary open angle glaucoma; C/D cup-to-disc ratio; HVF humphrey visual field; GVF goldmann visual field; OCT optical coherence tomography; IOP intraocular pressure; BRVO Branch retinal vein occlusion; CRVO central retinal vein occlusion; CRAO central retinal artery occlusion; BRAO branch retinal artery occlusion; RT retinal tear; SB scleral buckle; PPV pars plana vitrectomy; VH Vitreous hemorrhage; PRP panretinal laser photocoagulation; IVK intravitreal kenalog; VMT vitreomacular traction; MH Macular hole;  NVD neovascularization of the disc; NVE neovascularization elsewhere; AREDS age related eye disease study; ARMD age related macular degeneration; POAG primary open angle glaucoma; EBMD epithelial/anterior basement membrane dystrophy; ACIOL anterior chamber intraocular lens; IOL intraocular lens; PCIOL posterior chamber intraocular lens; Phaco/IOL phacoemulsification with intraocular lens placement;  PRK photorefractive keratectomy; LASIK laser assisted in situ keratomileusis; HTN hypertension; DM diabetes mellitus; COPD chronic obstructive pulmonary disease

## 2022-08-09 ENCOUNTER — Ambulatory Visit (INDEPENDENT_AMBULATORY_CARE_PROVIDER_SITE_OTHER): Payer: Medicare Other | Admitting: Ophthalmology

## 2022-08-09 ENCOUNTER — Encounter (INDEPENDENT_AMBULATORY_CARE_PROVIDER_SITE_OTHER): Payer: Self-pay | Admitting: Ophthalmology

## 2022-08-09 DIAGNOSIS — E103293 Type 1 diabetes mellitus with mild nonproliferative diabetic retinopathy without macular edema, bilateral: Secondary | ICD-10-CM

## 2022-08-09 DIAGNOSIS — Z961 Presence of intraocular lens: Secondary | ICD-10-CM | POA: Diagnosis not present

## 2022-08-09 DIAGNOSIS — I10 Essential (primary) hypertension: Secondary | ICD-10-CM

## 2022-08-09 DIAGNOSIS — Z8669 Personal history of other diseases of the nervous system and sense organs: Secondary | ICD-10-CM | POA: Diagnosis not present

## 2022-08-09 DIAGNOSIS — H35033 Hypertensive retinopathy, bilateral: Secondary | ICD-10-CM | POA: Diagnosis not present

## 2022-08-09 DIAGNOSIS — H539 Unspecified visual disturbance: Secondary | ICD-10-CM

## 2022-08-09 DIAGNOSIS — G43711 Chronic migraine without aura, intractable, with status migrainosus: Secondary | ICD-10-CM
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
## 2022-08-16 ENCOUNTER — Ambulatory Visit (INDEPENDENT_AMBULATORY_CARE_PROVIDER_SITE_OTHER): Payer: Medicare Other | Admitting: Family Medicine

## 2022-08-16 ENCOUNTER — Encounter: Payer: Self-pay | Admitting: Family Medicine

## 2022-08-16 VITALS — BP 134/65 | HR 67 | Ht 62.0 in | Wt 112.5 lb

## 2022-08-16 DIAGNOSIS — G43709 Chronic migraine without aura, not intractable, without status migrainosus: Secondary | ICD-10-CM

## 2022-08-16 MED ORDER — ONABOTULINUMTOXINA 200 UNITS IJ SOLR
155.0000 [IU] | Freq: Once | INTRAMUSCULAR | Status: AC
  Administered 2022-08-16: 155 [IU] via INTRAMUSCULAR

## 2022-08-17 ENCOUNTER — Encounter: Payer: Self-pay | Admitting: Nurse Practitioner

## 2022-08-17 ENCOUNTER — Ambulatory Visit (INDEPENDENT_AMBULATORY_CARE_PROVIDER_SITE_OTHER): Payer: Medicare Other | Admitting: Nurse Practitioner

## 2022-08-17 VITALS — BP 122/74 | HR 69 | Ht 62.0 in | Wt 111.0 lb

## 2022-08-17 DIAGNOSIS — E1143 Type 2 diabetes mellitus with diabetic autonomic (poly)neuropathy: Secondary | ICD-10-CM | POA: Diagnosis not present

## 2022-08-17 DIAGNOSIS — K219 Gastro-esophageal reflux disease without esophagitis: Secondary | ICD-10-CM | POA: Diagnosis not present

## 2022-08-17 DIAGNOSIS — K3184 Gastroparesis: Secondary | ICD-10-CM

## 2022-08-17 DIAGNOSIS — K5909 Other constipation: Secondary | ICD-10-CM

## 2022-08-17 MED ORDER — LINACLOTIDE 145 MCG PO CAPS: ORAL_CAPSULE | ORAL | 6 refills | Status: DC

## 2022-08-17 MED ORDER — PANTOPRAZOLE SODIUM 40 MG PO TBEC: 40.0000 mg | DELAYED_RELEASE_TABLET | Freq: Two times a day (BID) | ORAL | 6 refills | Status: DC

## 2022-08-17 NOTE — Progress Notes (Signed)
Chief Complaint:  follow up   Assessment &  Plan   #71 yo female with Type 1 diabetes with gastroparesis. Managing well with small frequent meals and Dramamine as needed.   #GERD, doing well on Pantoprazole 40 mg BID Continue BID Pantoprazole, # 60 refill x 6  #Chronic constipation, doing well on Linzess Refill Linzess 145 mcg #30 with 6 refills  # Colon cancer screening. Next colonoscopy due 734  HPI   71 year old female known to Dr. Lavon Paganini with a past medical history of gastroparesis, GERD, type 1 diabetes, migraines, high-grade SBO in 2015 status post exploratory laparotomy with LOA and ileocecectomy, SIBO, colon polyps, diverticulosis, chronic constipation see PMH /PSH for additional history   Taylor Adams was seen by Dr. Lavon Paganini 04/11/2022 for follow-up on gastroparesis. She has been reluctant to use any promotility agents due to potential side effects.  She was not interested in a gastric stimulator.  A 64-month follow-up was recommended  Interval History:  Taylor Adams is here for her 83-month follow-up . Doing okay on small frequent meals except for occasional belching.  Doesn't want to take Zofran due to potential for constipation but takes Dramamine as needed which actually works better for her.   She has Reglan on hand but scared to use it due to potential side effects. Recent A1c reportedly ~ 6.  For chronic constipation she takes Linzess daily. She has a BM every day.   She takes BID pantoprazole for GERD and generally doesn't have much heartburn unless she eats certain foods such as fried okra.  Past Medical History:  Diagnosis Date   Anxiety    Arthritis    Benign colon polyp    Cataracts, bilateral    Depression    Diabetes mellitus    Diarrhea    Gastroparesis    Headache    migraines   Intestinal bacterial overgrowth    Osteoporosis 2021   PONV (postoperative nausea and vomiting)    history of n/v   Salivary gland disorder    clogged salivary glands   SBO  (small bowel obstruction) (HCC)     Past Surgical History:  Procedure Laterality Date   ABDOMINAL HYSTERECTOMY     APPENDECTOMY     BREAST EXCISIONAL BIOPSY Bilateral    benign   BREAST SURGERY     biopsy   CATARACT EXTRACTION Bilateral 2017   Dr. Vonna Kotyk   CATARACT EXTRACTION, BILATERAL     EYE SURGERY     ILEOCECETOMY  10/07/2013   Procedure: Ferne Coe;  Surgeon: Axel Filler, MD;  Location: [facility] OR;  Service: General;;   LAPAROTOMY N/A 10/07/2013   Procedure: EXPLORATORY LAPAROTOMY;  Surgeon: Axel Filler, MD;  Location: [facility] OR;  Service: General;  Laterality: N/A;   LYSIS OF ADHESION  10/07/2013   Procedure: LYSIS OF ADHESION;  Surgeon: Axel Filler, MD;  Location: [facility] OR;  Service: General;;   NASAL SEPTOPLASTY W/ TURBINOPLASTY Bilateral 12/28/2016   Procedure: NASAL SEPTOPLASTY WITH  BILATARAL TURBINATE REDUCTION;  Surgeon: Newman Pies, MD;  Location: [facility] OR;  Service: ENT;  Laterality: Bilateral;   resection of lymph node in neck     ROTATOR CUFF REPAIR     right side   SALIVARY GLAND SURGERY  11/25/2020   SINUS ENDO WITH FUSION Bilateral 12/28/2016   Procedure: ENDOSCOPIC TOTAL ETHMOIDECTOMY,ENDOSCOPIC MAXILLARY ANTROSTOMY ;  Surgeon: Newman Pies, MD;  Location: [facility] OR;  Service: ENT;  Laterality: Bilateral;   YAG LASER APPLICATION Bilateral 2018    Current Medications, Allergies,  Family History and Social History were reviewed in [name].     Current Outpatient Medications  Medication Sig Dispense Refill   ALPRAZolam (XANAX) 0.5 MG tablet Take 1 tablet (0.5 mg total) by mouth 2 (two) times daily as needed for anxiety. 30 tablet 0   B-D ULTRA-FINE 33 LANCETS MISC      BAYER CONTOUR NEXT TEST test strip      Biotin 5000 MCG TABS Take 5,000 mcg by mouth daily.      budesonide-formoterol (SYMBICORT) 80-4.5 MCG/ACT inhaler Inhale 2 puffs into the lungs 2 (two) times daily. 1 Inhaler 11   Cholecalciferol (VITAMIN D3) 1000 units CAPS Take 1,000 Units  by mouth 2 (two) times daily.      Continuous Blood Gluc Sensor (DEXCOM G6 SENSOR) MISC USE AS DIRECTED & CHANGE SENSOR EVERY 30 DAYS     cyclobenzaprine (FLEXERIL) 10 MG tablet Take 10 mg by mouth as needed.     GLUCAGEN HYPOKIT 1 MG SOLR injection   6   hydrOXYzine (ATARAX/VISTARIL) 25 MG tablet Take 25 mg by mouth 3 (three) times daily as needed.     Insulin Human (INSULIN PUMP) 100 unit/ml SOLN Inject into the skin continuous. Novolog 100 unit/mL Basal Rate: 12 am to 4:30 am  0.375 4:30 am to 8 am   0.45 8 am to 12 pm    0.525 12 pm to 3 pm  0.45 3 pm to 9 pm 0.475 9 pm to 12 am 0.425     LINZESS 145 MCG CAPS capsule TAKE ONE CAPSULE BY MOUTH DAILY BEFORE BREAKFAST 30 capsule 3   losartan (COZAAR) 50 MG tablet Take 50 mg by mouth daily.     metoCLOPramide (REGLAN) 5 MG tablet Take 1 tablet (5 mg total) by mouth 4 (four) times daily. 120 tablet 1   Multiple Vitamin (MULTIVITAMIN) tablet Take 1 tablet by mouth daily. Women's 50+     oxyCODONE-acetaminophen (PERCOCET) 10-325 MG tablet Take 1 tablet by mouth every 6 (six) hours as needed for pain. 30 tablet 0   pantoprazole (PROTONIX) 40 MG tablet TAKE ONE TABLET BY MOUTH TWICE A DAY *TAKE FOR 3 MONTHS AND SCHEDULE AN OFFICE VISIT IN 2 MONTHS 60 tablet 3   Probiotic Product (VSL#3) CAPS Take 1 capsule by mouth daily. 30 capsule 11   Rimegepant Sulfate (NURTEC) 75 MG TBDP Take 75 mg by mouth daily as needed. For migraines. Take as close to onset of migraine as possible. One daily maximum. 32 tablet 0   No current facility-administered medications for this visit.    Review of Systems: No chest pain. No shortness of breath. No urinary complaints.    Physical Exam  Wt Readings from Last 3 Encounters:  08/16/22 112 lb 8 oz (51 kg)  04/11/22 113 lb 2 oz (51.3 kg)  01/17/22 115 lb (52.2 kg)    BP 122/74   Pulse 69   Ht 5\' 2"  (1.575 m)   Wt 111 lb (50.3 kg)   BMI 20.30 kg/m  Constitutional:  Generally well appearing female in no  acute distress. Psychiatric: Pleasant. Normal mood and affect. Behavior is normal. EENT: Pupils normal.  Conjunctivae are normal. No scleral icterus. Neck supple.  Cardiovascular: Normal rate, regular rhythm. No edema Pulmonary/chest: Effort normal and breath sounds normal. No wheezing, rales or rhonchi. Abdominal: Soft, nondistended, nontender. Bowel sounds active throughout. There are no masses palpable. No hepatomegaly. Neurological: Alert and oriented to person place and time. Skin: Skin is warm  and dry. No rashes noted.  Willette Cluster, NP  08/17/2022, 8:09 AM

## 2022-08-17 NOTE — Patient Instructions (Signed)
We have sent the following medications to your pharmacy for you to pick up at your convenience:  Linzess and Pantoprazole  Please make a follow up appointment in 6 months

## 2022-08-22 DIAGNOSIS — L218 Other seborrheic dermatitis: Secondary | ICD-10-CM | POA: Diagnosis not present

## 2022-08-22 DIAGNOSIS — L820 Inflamed seborrheic keratosis: Secondary | ICD-10-CM | POA: Diagnosis not present

## 2022-08-22 DIAGNOSIS — L2089 Other atopic dermatitis: Secondary | ICD-10-CM | POA: Diagnosis not present

## 2022-08-29 ENCOUNTER — Telehealth: Payer: Self-pay | Admitting: *Deleted

## 2022-08-29 NOTE — Telephone Encounter (Signed)
Request from pharmacy protonix sent in on 11/15 needed to be updated faxed rx to pharmacy for Protonix 40 mg one tablet twice a day

## 2022-08-30 DIAGNOSIS — L820 Inflamed seborrheic keratosis: Secondary | ICD-10-CM | POA: Diagnosis not present

## 2022-09-05 ENCOUNTER — Other Ambulatory Visit: Payer: Self-pay | Admitting: Gastroenterology

## 2022-09-12 DIAGNOSIS — R7989 Other specified abnormal findings of blood chemistry: Secondary | ICD-10-CM | POA: Diagnosis not present

## 2022-09-12 DIAGNOSIS — M810 Age-related osteoporosis without current pathological fracture: Secondary | ICD-10-CM | POA: Diagnosis not present

## 2022-09-12 DIAGNOSIS — E1040 Type 1 diabetes mellitus with diabetic neuropathy, unspecified: Secondary | ICD-10-CM | POA: Diagnosis not present

## 2022-09-12 DIAGNOSIS — R5383 Other fatigue: Secondary | ICD-10-CM | POA: Diagnosis not present

## 2022-09-12 DIAGNOSIS — I10 Essential (primary) hypertension: Secondary | ICD-10-CM | POA: Diagnosis not present

## 2022-09-15 DIAGNOSIS — M810 Age-related osteoporosis without current pathological fracture: Secondary | ICD-10-CM | POA: Diagnosis not present

## 2022-09-15 DIAGNOSIS — Z0000 Encounter for general adult medical examination without abnormal findings: Secondary | ICD-10-CM | POA: Diagnosis not present

## 2022-09-15 DIAGNOSIS — I7300 Raynaud's syndrome without gangrene: Secondary | ICD-10-CM | POA: Diagnosis not present

## 2022-09-15 DIAGNOSIS — I10 Essential (primary) hypertension: Secondary | ICD-10-CM | POA: Diagnosis not present

## 2022-09-15 DIAGNOSIS — Z4681 Encounter for fitting and adjustment of insulin pump: Secondary | ICD-10-CM | POA: Diagnosis not present

## 2022-09-15 DIAGNOSIS — E1043 Type 1 diabetes mellitus with diabetic autonomic (poly)neuropathy: Secondary | ICD-10-CM | POA: Diagnosis not present

## 2022-09-15 DIAGNOSIS — Z1331 Encounter for screening for depression: Secondary | ICD-10-CM | POA: Diagnosis not present

## 2022-09-15 DIAGNOSIS — Z23 Encounter for immunization: Secondary | ICD-10-CM | POA: Diagnosis not present

## 2022-09-15 DIAGNOSIS — M5030 Other cervical disc degeneration, unspecified cervical region: Secondary | ICD-10-CM | POA: Diagnosis not present

## 2022-09-15 DIAGNOSIS — K219 Gastro-esophageal reflux disease without esophagitis: Secondary | ICD-10-CM | POA: Diagnosis not present

## 2022-09-15 DIAGNOSIS — Z1339 Encounter for screening examination for other mental health and behavioral disorders: Secondary | ICD-10-CM | POA: Diagnosis not present

## 2022-09-15 DIAGNOSIS — E103293 Type 1 diabetes mellitus with mild nonproliferative diabetic retinopathy without macular edema, bilateral: Secondary | ICD-10-CM | POA: Diagnosis not present

## 2022-09-15 DIAGNOSIS — Z794 Long term (current) use of insulin: Secondary | ICD-10-CM | POA: Diagnosis not present

## 2022-09-15 DIAGNOSIS — G629 Polyneuropathy, unspecified: Secondary | ICD-10-CM | POA: Diagnosis not present

## 2022-10-25 DIAGNOSIS — E1043 Type 1 diabetes mellitus with diabetic autonomic (poly)neuropathy: Secondary | ICD-10-CM | POA: Diagnosis not present

## 2022-10-25 DIAGNOSIS — B029 Zoster without complications: Secondary | ICD-10-CM | POA: Diagnosis not present
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
## 2022-11-10 ENCOUNTER — Ambulatory Visit (INDEPENDENT_AMBULATORY_CARE_PROVIDER_SITE_OTHER): Payer: Medicare Other | Admitting: Family Medicine

## 2022-11-10 DIAGNOSIS — G43709 Chronic migraine without aura, not intractable, without status migrainosus: Secondary | ICD-10-CM | POA: Diagnosis not present

## 2022-11-10 MED ORDER — ONABOTULINUMTOXINA 200 UNITS IJ SOLR
155.0000 [IU] | Freq: Once | INTRAMUSCULAR | Status: AC
  Administered 2022-11-10: 175 [IU] via INTRAMUSCULAR

## 2022-11-10 NOTE — Progress Notes (Signed)
Botox- 200 units x 1 vial Lot: [id number] Expiration: 03/2025 NDC: 0023-3921-02  Bacteriostatic 0.9% Sodium Chloride- 4mL total Lot: [id number] Expiration: 11/25 NDC: 63323-924-03  Dx: G43.709 B/B 

## 2022-11-14 DIAGNOSIS — Z961 Presence of intraocular lens: Secondary | ICD-10-CM | POA: Diagnosis not present

## 2022-11-14 DIAGNOSIS — H00021 Hordeolum internum right upper eyelid: Secondary | ICD-10-CM | POA: Diagnosis not present

## 2022-11-14 DIAGNOSIS — E109 Type 1 diabetes mellitus without complications: Secondary | ICD-10-CM | POA: Diagnosis not present

## 2022-11-14 DIAGNOSIS — H5319 Other subjective visual disturbances: Secondary | ICD-10-CM | POA: Diagnosis not present

## 2022-11-15 DIAGNOSIS — L209 Atopic dermatitis, unspecified: Secondary | ICD-10-CM | POA: Diagnosis not present

## 2022-11-15 DIAGNOSIS — E1040 Type 1 diabetes mellitus with diabetic neuropathy, unspecified: Secondary | ICD-10-CM | POA: Diagnosis not present

## 2022-11-15 DIAGNOSIS — F419 Anxiety disorder, unspecified: Secondary | ICD-10-CM | POA: Diagnosis not present

## 2022-12-05 DIAGNOSIS — R21 Rash and other nonspecific skin eruption: Secondary | ICD-10-CM | POA: Diagnosis not present

## 2022-12-05 DIAGNOSIS — L309 Dermatitis, unspecified: Secondary | ICD-10-CM | POA: Diagnosis not present

## 2022-12-05 DIAGNOSIS — L308 Other specified dermatitis: Secondary | ICD-10-CM | POA: Diagnosis not present

## 2022-12-05 DIAGNOSIS — D485 Neoplasm of uncertain behavior of skin: Secondary | ICD-10-CM | POA: Diagnosis not present

## 2022-12-23 DIAGNOSIS — G43711 Chronic migraine without aura, intractable, with status migrainosus: Secondary | ICD-10-CM | POA: Diagnosis not present

## 2022-12-23 DIAGNOSIS — I7300 Raynaud's syndrome without gangrene: Secondary | ICD-10-CM | POA: Diagnosis not present

## 2022-12-23 DIAGNOSIS — M5030 Other cervical disc degeneration, unspecified cervical region: Secondary | ICD-10-CM | POA: Diagnosis not present

## 2022-12-23 DIAGNOSIS — L209 Atopic dermatitis, unspecified: Secondary | ICD-10-CM | POA: Diagnosis not present

## 2022-12-23 DIAGNOSIS — I10 Essential (primary) hypertension: Secondary | ICD-10-CM | POA: Diagnosis not present

## 2022-12-23 DIAGNOSIS — E1043 Type 1 diabetes mellitus with diabetic autonomic (poly)neuropathy: Secondary | ICD-10-CM | POA: Diagnosis not present

## 2022-12-23 DIAGNOSIS — K219 Gastro-esophageal reflux disease without esophagitis: Secondary | ICD-10-CM | POA: Diagnosis not present

## 2022-12-23 DIAGNOSIS — G629 Polyneuropathy, unspecified: Secondary | ICD-10-CM | POA: Diagnosis not present

## 2022-12-23 DIAGNOSIS — Z4681 Encounter for fitting and adjustment of insulin pump: Secondary | ICD-10-CM | POA: Diagnosis not present

## 2022-12-23 DIAGNOSIS — Z794 Long term (current) use of insulin: Secondary | ICD-10-CM | POA: Diagnosis not present

## 2022-12-23 DIAGNOSIS — J45991 Cough variant asthma: Secondary | ICD-10-CM | POA: Diagnosis not present

## 2022-12-23 DIAGNOSIS — M810 Age-related osteoporosis without current pathological fracture: Secondary | ICD-10-CM | POA: Diagnosis not present
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
## 2023-02-02 ENCOUNTER — Ambulatory Visit (INDEPENDENT_AMBULATORY_CARE_PROVIDER_SITE_OTHER): Payer: Medicare Other | Admitting: Family Medicine

## 2023-02-02 DIAGNOSIS — G43709 Chronic migraine without aura, not intractable, without status migrainosus: Secondary | ICD-10-CM

## 2023-02-02 MED ORDER — ONABOTULINUMTOXINA 200 UNITS IJ SOLR
155.0000 [IU] | Freq: Once | INTRAMUSCULAR | Status: AC
  Administered 2023-02-02: 175 [IU] via INTRAMUSCULAR

## 2023-02-02 NOTE — Progress Notes (Signed)
.  Botox- 100 units x 2 vials Lot: [id number] Expiration: 03/2025 NDC: 5409-8119-14  Bacteriostatic 0.9% Sodium Chloride- 2 mL  [id number] Expiration: 11/25 NDC: 86578-469-62  Dx:G43.709 B/B Witnessed by Otilio Connors

## 2023-03-23 DIAGNOSIS — E1043 Type 1 diabetes mellitus with diabetic autonomic (poly)neuropathy: Secondary | ICD-10-CM | POA: Diagnosis not present

## 2023-03-23 DIAGNOSIS — E7800 Pure hypercholesterolemia, unspecified: Secondary | ICD-10-CM | POA: Diagnosis not present

## 2023-03-23 DIAGNOSIS — G43711 Chronic migraine without aura, intractable, with status migrainosus: Secondary | ICD-10-CM | POA: Diagnosis not present

## 2023-03-23 DIAGNOSIS — Z4681 Encounter for fitting and adjustment of insulin pump: Secondary | ICD-10-CM | POA: Diagnosis not present

## 2023-03-23 DIAGNOSIS — M810 Age-related osteoporosis without current pathological fracture: Secondary | ICD-10-CM | POA: Diagnosis not present

## 2023-03-23 DIAGNOSIS — F419 Anxiety disorder, unspecified: Secondary | ICD-10-CM | POA: Diagnosis not present

## 2023-03-23 DIAGNOSIS — I10 Essential (primary) hypertension: Secondary | ICD-10-CM | POA: Diagnosis not present

## 2023-03-23 DIAGNOSIS — G629 Polyneuropathy, unspecified: Secondary | ICD-10-CM | POA: Diagnosis not present

## 2023-03-23 DIAGNOSIS — E103293 Type 1 diabetes mellitus with mild nonproliferative diabetic retinopathy without macular edema, bilateral: Secondary | ICD-10-CM | POA: Diagnosis not present

## 2023-03-23 DIAGNOSIS — E1040 Type 1 diabetes mellitus with diabetic neuropathy, unspecified: Secondary | ICD-10-CM | POA: Diagnosis not present

## 2023-03-23 DIAGNOSIS — Z794 Long term (current) use of insulin: Secondary | ICD-10-CM | POA: Diagnosis not present

## 2023-03-23 DIAGNOSIS — K219 Gastro-esophageal reflux disease without esophagitis: Secondary | ICD-10-CM | POA: Diagnosis not present

## 2023-03-31 IMAGING — RF DG UGI W/ SMALL BOWEL
14 of 22 series · 15 of 24 positions shown · non-contrast
Comparison: CT 07/15/2021

CLINICAL DATA: rule out small bowel obstruction

EXAM:
UPPER GI SERIES WITH SMALL BOWEL FOLLOW-THROUGH
FLUOROSCOPY:
Fluoroscopy Time:  1 minute 30 seconds
Radiation Exposure Index (if provided by the fluoroscopic device):
44.0 mGy
Number of Acquired Spot Images: 25 images.  Multiple cine clips.
TECHNIQUE: Combined double contrast and single contrast upper GI series using
effervescent crystals, thick barium, and thin barium. Subsequently,
serial images of the small bowel were obtained including spot views
of the terminal ileum.

[Series 1: t abdomen supine · 0.15mm/px · 1 of 1 slices shown]
[im 1/1]
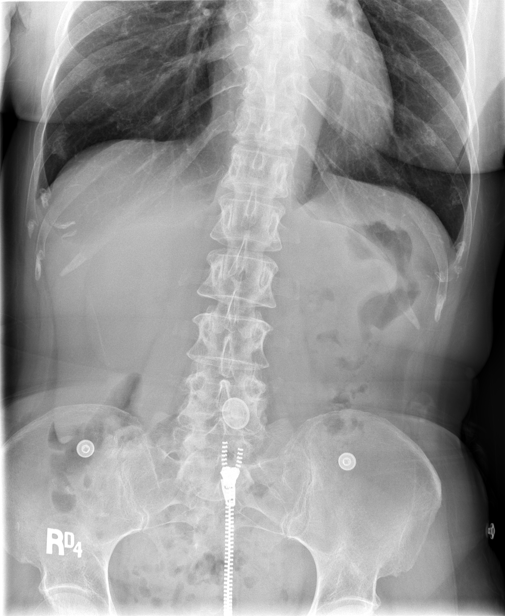

[Series 3: cp_standard · 0.30mm/px · 1 of 1 slices shown (1 of 13)]
[im 1/1]
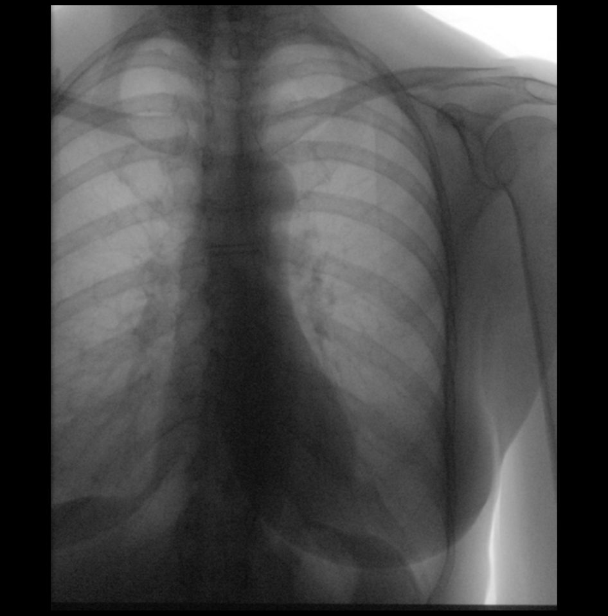

[Series 4: cp_standard · 0.59mm/px · 2 of 86 frames shown (2 of 13)]
[frame 68/86]
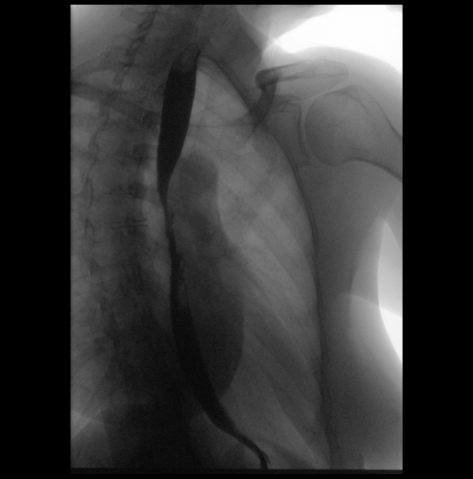
[frame 74/86]
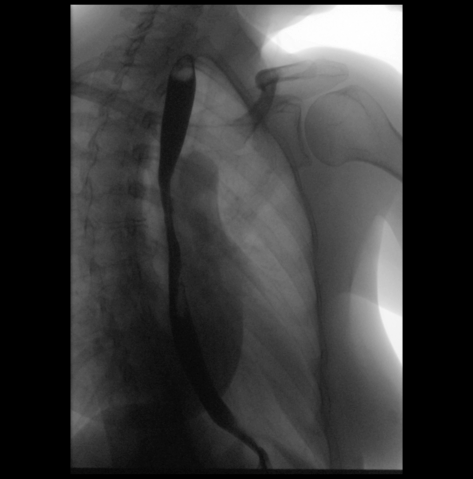

[Series 6: cp_standard · 0.30mm/px · 1 of 1 slices shown (3 of 13)]
[im 1/1]
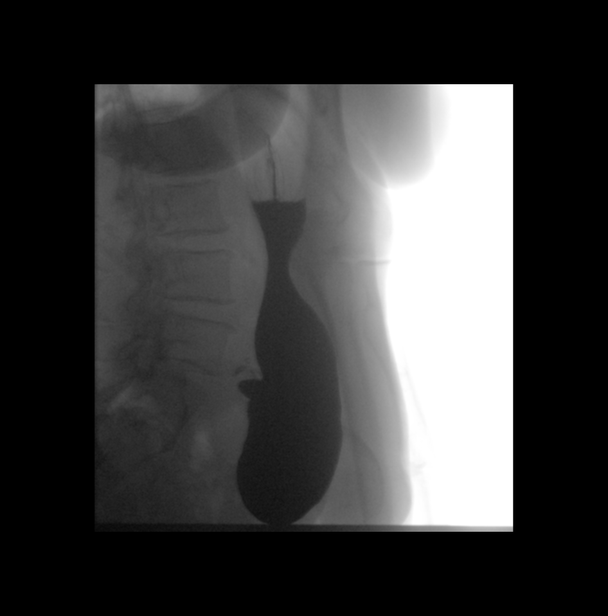

[Series 7: cp_standard · 0.30mm/px · 1 of 1 slices shown (4 of 13)]
[im 1/1]
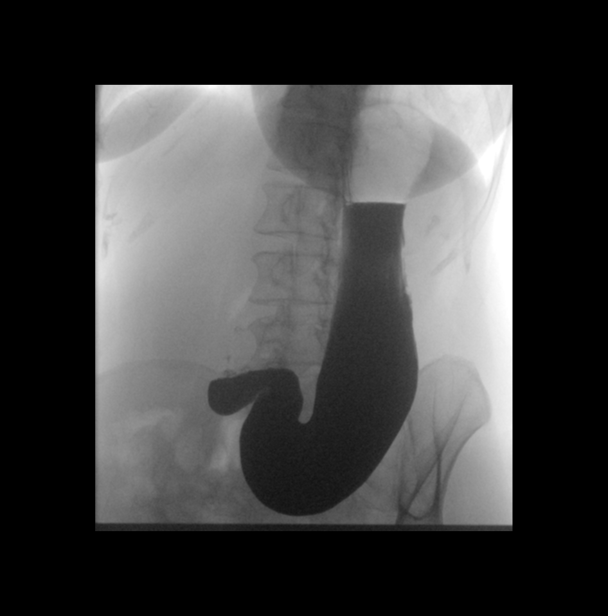

[Series 9: cp_standard · 0.30mm/px · 1 of 1 slices shown (5 of 13)]
[im 1/1]
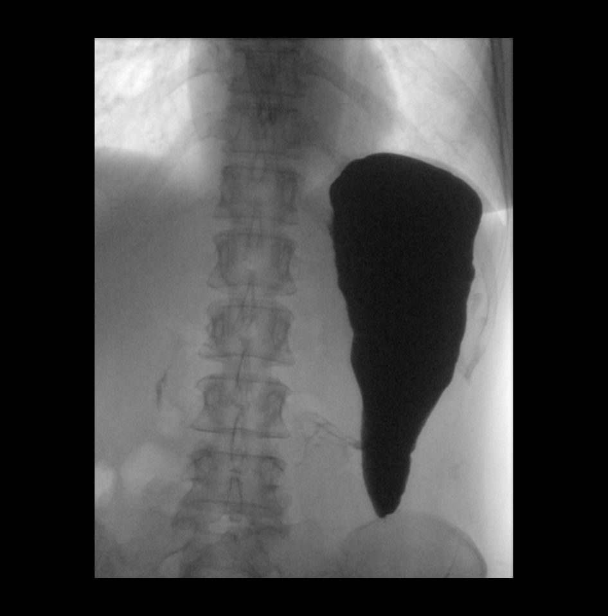

[Series 12: cp_standard · 0.28mm/px · 1 of 1 slices shown (6 of 13)]
[im 1/1]
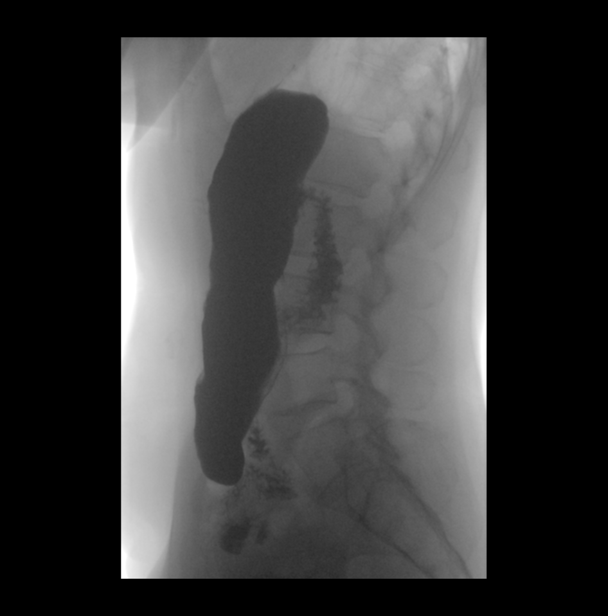

[Series 13: cp_standard · 0.26mm/px · 1 of 1 slices shown (7 of 13)]
[im 1/1]
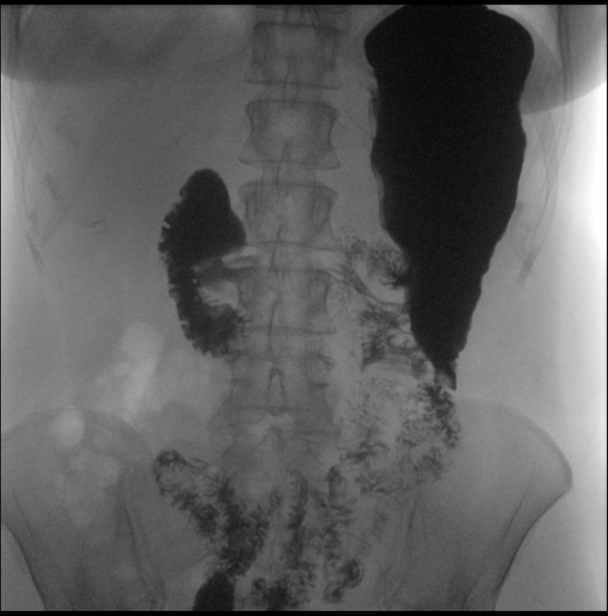

[Series 15: cp_standard · 0.27mm/px · 1 of 1 slices shown (8 of 13)]
[im 1/1]
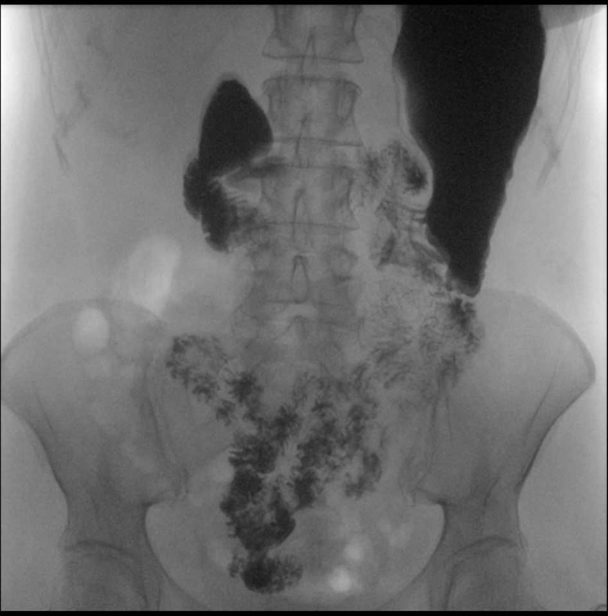

[Series 16: cp_standard · 0.26mm/px · 1 of 1 slices shown (9 of 13)]
[im 1/1]
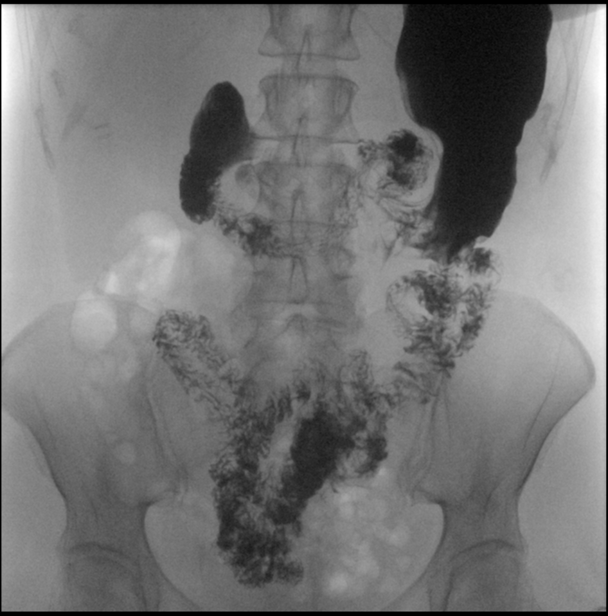

[Series 18: cp_standard · 0.26mm/px · 1 of 1 slices shown (10 of 13)]
[im 1/1]
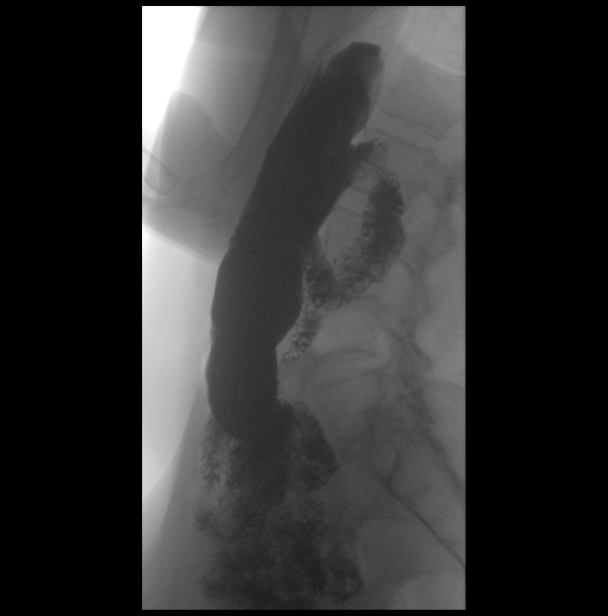

[Series 21: cp_standard · 0.26mm/px · 1 of 1 slices shown (11 of 13)]
[im 1/1]
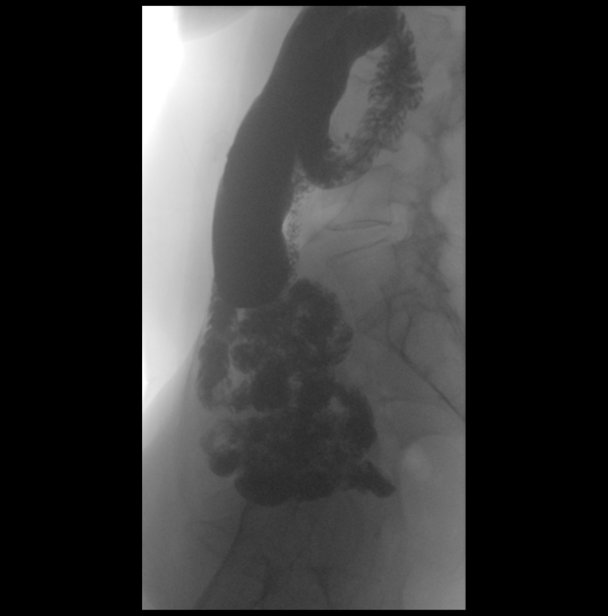

[Series 22: cp_standard · 0.25mm/px · 1 of 1 slices shown (12 of 13)]
[im 1/1]
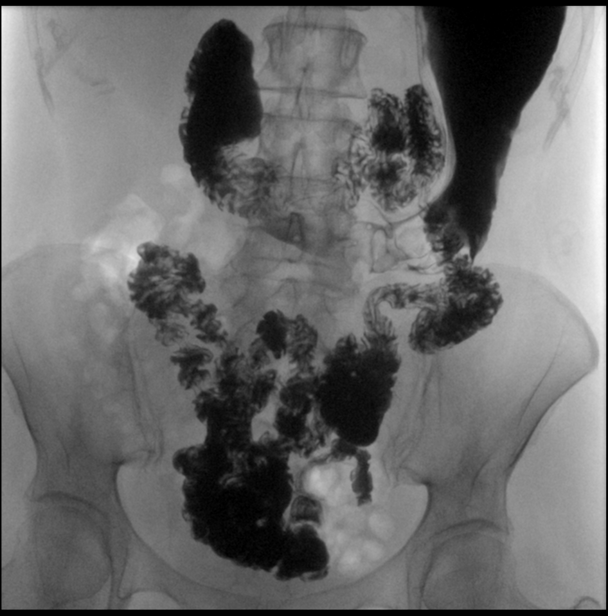

[Series 24: cp_standard · 0.28mm/px · 1 of 1 slices shown (13 of 13)]
[im 1/1]
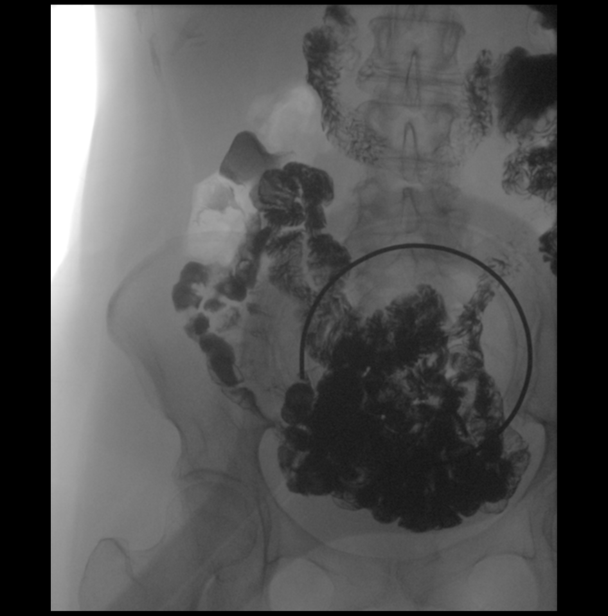

[15 of 24 positions shown; findings below may reference images not displayed]

FINDINGS: Primary peristaltic waves in the esophagus were normal. No evidence
esophageal stricture visible ulceration. No gastroesophageal reflux
was noted during the course of the examination. No evidence of
hiatal hernia.

Gastric morphology and appearance appear normal. Gastric emptying
appears qualitatively normal. Proximal duodenum appears normal.

Contrast normally progresses through small bowel. There is no
evidence of small-bowel stricture. Small bowel is normal in caliber
and small bowel folds appear normal. Contrast progresses to the
cecum within 45 minutes.
IMPRESSION: Normal upper GI series and small-bowel follow-through. No evidence
of bowel obstruction.
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
## 2023-04-27 ENCOUNTER — Ambulatory Visit (INDEPENDENT_AMBULATORY_CARE_PROVIDER_SITE_OTHER): Payer: Medicare Other | Admitting: Family Medicine

## 2023-04-27 DIAGNOSIS — G43709 Chronic migraine without aura, not intractable, without status migrainosus: Secondary | ICD-10-CM | POA: Diagnosis not present

## 2023-04-27 MED ORDER — ONABOTULINUMTOXINA 200 UNITS IJ SOLR
155.0000 [IU] | Freq: Once | INTRAMUSCULAR | Status: AC
  Administered 2023-04-27: 175 [IU] via INTRAMUSCULAR

## 2023-04-27 NOTE — Progress Notes (Signed)
Botox- 200 units x 1 vial Lot: [id number] Expiration: 09/2025 NDC: 1610-9604-54  Bacteriostatic 0.9% Sodium Chloride- 4 mL  Lot: [id number] Expiration: 08/04/2024 NDC: 1478-2956-21  Dx: H08.657 B/B Witnessed by Maryjean Ka

## 2023-05-17 ENCOUNTER — Ambulatory Visit: Payer: Medicare Other | Admitting: Gastroenterology

## 2023-05-17 ENCOUNTER — Encounter: Payer: Self-pay | Admitting: Gastroenterology

## 2023-05-17 VITALS — BP 122/68 | HR 65 | Ht 62.0 in | Wt 108.0 lb

## 2023-05-17 DIAGNOSIS — K3184 Gastroparesis: Secondary | ICD-10-CM

## 2023-05-17 DIAGNOSIS — K5903 Drug induced constipation: Secondary | ICD-10-CM | POA: Diagnosis not present

## 2023-05-17 DIAGNOSIS — R110 Nausea: Secondary | ICD-10-CM

## 2023-05-17 DIAGNOSIS — K5904 Chronic idiopathic constipation: Secondary | ICD-10-CM | POA: Diagnosis not present

## 2023-05-17 MED ORDER — NALOXEGOL OXALATE 25 MG PO TABS: ORAL_TABLET | ORAL | 1 refills | Status: DC

## 2023-05-17 MED ORDER — METOCLOPRAMIDE HCL 5 MG PO TABS: 5.0000 mg | ORAL_TABLET | Freq: Three times a day (TID) | ORAL | 3 refills | Status: DC

## 2023-05-17 NOTE — Patient Instructions (Signed)
We have sent the following medications to your pharmacy for you to pick up at your convenience:  Movantik Reglan  Continue Linzess 145 mcg  We have scheduled you 2 follow up appointments with Dr Lavon Paganini, one in October and one in December   _______________________________________________________  If your blood pressure at your visit was 140/90 or greater, please contact your primary care physician to follow up on this.  _______________________________________________________  If you are age 71 or older, your body mass index should be between 23-30. Your Body mass index is 19.75 kg/m. If this is out of the aforementioned range listed, please consider follow up with your Primary Care Provider.  If you are age 43 or younger, your body mass index should be between 19-25. Your Body mass index is 19.75 kg/m. If this is out of the aformentioned range listed, please consider follow up with your Primary Care Provider.   ________________________________________________________  The [facility] providers would like to encourage you to use [facility] to communicate with providers for non-urgent requests or questions.  Due to long hold times on the telephone, sending your provider a message by [facility] may be a faster and more efficient way to get a response.  Please allow 48 business hours for a response.  Please remember that this is for non-urgent requests.  _______________________________________________________   I appreciate the  opportunity to care for you  Thank You   Marsa Aris , MD

## 2023-05-17 NOTE — Progress Notes (Signed)
Taylor Adams [facility]    [id number]    1951-03-10  Primary Care Physician:Tisovec, Adelfa Koh, MD  Referring Physician: Gaspar Garbe, MD [address] [place],  [place] [address]   Chief complaint:  Gastroparesis  HPI:  72 year old very pleasant female here for follow up of gastroparesis. She continues to have intermittent nausea, abdominal bloating with discomfort, overall she feels symptoms are persistent but manageable   She has modified her diet and is tolerating certain foods better.  She feels she is not getting adequate nutrition as she has to avoid many foods, especially misses eating her greens.  She has constant fatigue. Bowel habits are overall improved on Linzess, continues to have intermittent constipation especially when she has to take tramadol for severe pain.  She is able to manage her symptoms somewhat with daily Dramamine and Reglan as needed.  She is no longer losing weight. Zofran causes severe constipation  She has continuous glucose monitor and insulin pump, does have significant spikes especially with high carb diet  GI Hx: EGD July 2022: Mild duodenitis otherwise unremarkable   Small bowel follow-through was negative for bowel obstruction.   She was treated with Xifaxan in 2017 for small intestinal bacterial overgrowth and she has been relatively symptom-free for past few years until recent flare up.      High-grade SBO in January 2015 , she underwent exploratory laparotomy with lysis of adhesions and ileocectomy.   In 2002 she was diagnosed with bacterial overgrowth on a breath Hydrogen test.   She has history of gastroparesis. She was on Reglan but it was discontinued in 2015 given no significant improvement  Patient had a colonoscopy in 2002 with removal of a tubular adenoma. A repeat colonoscopy in 2007 was normal. Recent colonoscopy in July 2017 was unremarkable as well except for sigmoid diverticulosis .    Other past GI workup include   small bowel follow-through in 2005 was normal. An upper abdominal ultrasound in 2005 was negative. Her celiac profile was normal in 2005.   Outpatient Encounter Medications as of 05/17/2023  Medication Sig   ALPRAZolam (XANAX) 0.5 MG tablet Take 1 tablet (0.5 mg total) by mouth 2 (two) times daily as needed for anxiety.   B-D ULTRA-FINE 33 LANCETS MISC    BAYER CONTOUR NEXT TEST test strip    Biotin 5000 MCG TABS Take 5,000 mcg by mouth daily.    budesonide-formoterol (SYMBICORT) 80-4.5 MCG/ACT inhaler Inhale 2 puffs into the lungs 2 (two) times daily.   Cholecalciferol (VITAMIN D3) 1000 units CAPS Take 1,000 Units by mouth 2 (two) times daily.    Continuous Blood Gluc Sensor (DEXCOM G6 SENSOR) MISC USE AS DIRECTED & CHANGE SENSOR EVERY 30 DAYS   cyclobenzaprine (FLEXERIL) 10 MG tablet Take 10 mg by mouth as needed.   GLUCAGEN HYPOKIT 1 MG SOLR injection    hydrOXYzine (ATARAX/VISTARIL) 25 MG tablet Take 25 mg by mouth 3 (three) times daily as needed.   Insulin Human (INSULIN PUMP) 100 unit/ml SOLN Inject into the skin continuous. Novolog 100 unit/mL Basal Rate: 12 am to 4:30 am  0.375 4:30 am to 8 am   0.45 8 am to 12 pm    0.525 12 pm to 3 pm  0.45 3 pm to 9 pm 0.475 9 pm to 12 am 0.425   linaclotide (LINZESS) 145 MCG CAPS capsule TAKE ONE CAPSULE BY MOUTH DAILY BEFORE BREAKFAST   losartan (COZAAR) 50 MG tablet Take  50 mg by mouth daily.   metoCLOPramide (REGLAN) 5 MG tablet Take 1 tablet (5 mg total) by mouth 4 (four) times daily.   Multiple Vitamin (MULTIVITAMIN) tablet Take 1 tablet by mouth daily. Women's 50+   oxyCODONE-acetaminophen (PERCOCET) 10-325 MG tablet Take 1 tablet by mouth every 6 (six) hours as needed for pain.   pantoprazole (PROTONIX) 40 MG tablet Take 1 tablet (40 mg total) by mouth 2 (two) times daily.   Probiotic Product (VSL#3) CAPS Take 1 capsule by mouth daily.   Rimegepant Sulfate (NURTEC) 75 MG TBDP Take 75 mg by mouth daily as needed. For migraines. Take as  close to onset of migraine as possible. One daily maximum.   No facility-administered encounter medications on file as of 05/17/2023.    Allergies as of 05/17/2023 - Review Complete 05/17/2023  Allergen Reaction Noted   Valacyclovir Hives 05/17/2023   Codeine Itching 04/15/2016   Latex Rash 04/15/2016    Past Medical History:  Diagnosis Date   Anxiety    Arthritis    Benign colon polyp    Cataracts, bilateral    Depression    Diabetes mellitus    Diarrhea    Gastroparesis    Headache    migraines   Intestinal bacterial overgrowth    Osteoporosis 2021   PONV (postoperative nausea and vomiting)    history of n/v   Salivary gland disorder    clogged salivary glands   SBO (small bowel obstruction) (HCC)     Past Surgical History:  Procedure Laterality Date   ABDOMINAL HYSTERECTOMY     APPENDECTOMY     BREAST EXCISIONAL BIOPSY Bilateral    benign   BREAST SURGERY     biopsy   CATARACT EXTRACTION Bilateral 2017   Dr. Vonna Kotyk   CATARACT EXTRACTION, BILATERAL     EYE SURGERY     ILEOCECETOMY  10/07/2013   Procedure: Ferne Coe;  Surgeon: Axel Filler, MD;  Location: [facility] OR;  Service: General;;   LAPAROTOMY N/A 10/07/2013   Procedure: EXPLORATORY LAPAROTOMY;  Surgeon: Axel Filler, MD;  Location: [facility] OR;  Service: General;  Laterality: N/A;   LYSIS OF ADHESION  10/07/2013   Procedure: LYSIS OF ADHESION;  Surgeon: Axel Filler, MD;  Location: [facility] OR;  Service: General;;   NASAL SEPTOPLASTY W/ TURBINOPLASTY Bilateral 12/28/2016   Procedure: NASAL SEPTOPLASTY WITH  BILATARAL TURBINATE REDUCTION;  Surgeon: Newman Pies, MD;  Location: [facility] OR;  Service: ENT;  Laterality: Bilateral;   resection of lymph node in neck     ROTATOR CUFF REPAIR     right side   SALIVARY GLAND SURGERY  11/25/2020   SINUS ENDO WITH FUSION Bilateral 12/28/2016   Procedure: ENDOSCOPIC TOTAL ETHMOIDECTOMY,ENDOSCOPIC MAXILLARY ANTROSTOMY ;  Surgeon: Newman Pies, MD;  Location: [facility] OR;  Service: ENT;  Laterality:  Bilateral;   YAG LASER APPLICATION Bilateral 2018    Family History  Problem Relation Age of Onset   Colon polyps Father    Heart disease Father    Diabetes Father    Diabetes Mother    Heart disease Mother    Heart disease Brother    Allergies Brother    Allergies Sister    Colon cancer Neg Hx    Rectal cancer Neg Hx    Stomach cancer Neg Hx    Esophageal cancer Neg Hx     Social History   Socioeconomic History   Marital status: Married    Spouse name: Not on file   Number of children:  Not on file   Years of education: Not on file   Highest education level: Not on file  Occupational History   Not on file  Tobacco Use   Smoking status: Never   Smokeless tobacco: Never  Vaping Use   Vaping status: Never Used  Substance and Sexual Activity   Alcohol use: Yes    Comment: glass of wine "every now and then"   Drug use: No   Sexual activity: Not on file  Other Topics Concern   Not on file  Social History Narrative   Lives at home with her husband   Right handed   Caffeine: 2 cups per day   Social Determinants of Health   Financial Resource Strain: Not on file  Food Insecurity: Not on file  Transportation Needs: Not on file  Physical Activity: Not on file  Stress: Not on file  Social Connections: Unknown (02/11/2022)   Received from [facility], [facility]   Social Network    Social Network: Not on file  Intimate Partner Violence: Unknown (01/04/2022)   Received from [facility], [facility]   HITS    Physically Hurt: Not on file    Insult or Talk Down To: Not on file    Threaten Physical Harm: Not on file    Scream or Curse: Not on file      Review of systems: All other review of systems negative except as mentioned in the HPI.   Physical Exam: Vitals:   05/17/23 0953  BP: 122/68  Pulse: 65   Body mass index is 19.75 kg/m. Gen:      No acute distress HEENT:  sclera anicteric Abd:      soft, non-tender; no palpable masses, no  distension Ext:    No edema Neuro: alert and oriented x 3 Psych: normal mood and affect  Data Reviewed:  Reviewed labs, radiology imaging, old records and pertinent past GI work up   Assessment and Plan/Recommendations:  72 year old female status post appendectomy and abdominal hysterectomy, high-grade SBO in January 2015 status post ex-lap, ileo cecectomy and lysis of adhesions, insulin-dependent diabetes here with complaints of nausea, vomiting, abdominal bloating, excessive belching and intermittent diarrhea Her symptoms are likely secondary to gastroparesis, she is reluctant to use any promotility agents due to potential side effects Continue with small frequent meals.  Advised patient to to continue to reintroduce the food she has been avoiding as smoothies specially vegetables, fruit and greens She is not interested in gastric stimulator, would like to avoid any invasive procedures Discussed adjusting insulin bolus timing and dosage to counter the effects of gastroparesis   Chronic idiopathic constipation : Continue Linzess 145 mcg daily   Additional opioid-induced constipation add Movantik 12.5 mg daily, use for 4 weeks if able to tolerate and has persistent constipation then increase to 25 mg daily  Patient was provided samples of Motegrity to try, advised her to take 2 mg 1 capsule daily, call next week to report symptoms, if she has significant improvement will send prescription for Motegrity.  Advised her to avoid taking Linzess or Movantik along with Motegrity  Return in 3 months or sooner if needed    This visit required 40 minutes of patient care (this includes precharting, chart review, review of results, face-to-face time used for counseling as well as treatment plan and follow-up. The patient was provided an opportunity to ask questions and all were answered. The patient agreed with the plan and demonstrated an understanding of the instructions.  Iona Beard , MD     CC: Tisovec, Adelfa Koh, MD

## 2023-06-27 DIAGNOSIS — I7300 Raynaud's syndrome without gangrene: Secondary | ICD-10-CM | POA: Diagnosis not present

## 2023-06-27 DIAGNOSIS — G43711 Chronic migraine without aura, intractable, with status migrainosus: Secondary | ICD-10-CM | POA: Diagnosis not present

## 2023-06-27 DIAGNOSIS — M5030 Other cervical disc degeneration, unspecified cervical region: Secondary | ICD-10-CM | POA: Diagnosis not present

## 2023-06-27 DIAGNOSIS — K3184 Gastroparesis: Secondary | ICD-10-CM | POA: Diagnosis not present

## 2023-06-27 DIAGNOSIS — L209 Atopic dermatitis, unspecified: Secondary | ICD-10-CM | POA: Diagnosis not present

## 2023-06-27 DIAGNOSIS — G629 Polyneuropathy, unspecified: Secondary | ICD-10-CM | POA: Diagnosis not present

## 2023-06-27 DIAGNOSIS — E1040 Type 1 diabetes mellitus with diabetic neuropathy, unspecified: Secondary | ICD-10-CM | POA: Diagnosis not present

## 2023-06-27 DIAGNOSIS — Z4681 Encounter for fitting and adjustment of insulin pump: Secondary | ICD-10-CM | POA: Diagnosis not present

## 2023-06-27 DIAGNOSIS — F419 Anxiety disorder, unspecified: Secondary | ICD-10-CM | POA: Diagnosis not present

## 2023-06-27 DIAGNOSIS — Z23 Encounter for immunization: Secondary | ICD-10-CM | POA: Diagnosis not present

## 2023-06-27 DIAGNOSIS — J45991 Cough variant asthma: Secondary | ICD-10-CM | POA: Diagnosis not present

## 2023-06-27 DIAGNOSIS — I10 Essential (primary) hypertension: Secondary | ICD-10-CM | POA: Diagnosis not present

## 2023-07-09 ENCOUNTER — Other Ambulatory Visit: Payer: Self-pay | Admitting: Nurse Practitioner

## 2023-07-11 ENCOUNTER — Ambulatory Visit (INDEPENDENT_AMBULATORY_CARE_PROVIDER_SITE_OTHER): Payer: Medicare Other | Admitting: Gastroenterology

## 2023-07-11 ENCOUNTER — Encounter: Payer: Self-pay | Admitting: Gastroenterology

## 2023-07-11 VITALS — BP 98/62 | HR 72 | Ht 62.0 in | Wt 108.4 lb

## 2023-07-11 DIAGNOSIS — R110 Nausea: Secondary | ICD-10-CM

## 2023-07-11 DIAGNOSIS — K3184 Gastroparesis: Secondary | ICD-10-CM | POA: Diagnosis not present

## 2023-07-11 DIAGNOSIS — E1143 Type 2 diabetes mellitus with diabetic autonomic (poly)neuropathy: Secondary | ICD-10-CM | POA: Diagnosis not present

## 2023-07-11 DIAGNOSIS — K5904 Chronic idiopathic constipation: Secondary | ICD-10-CM

## 2023-07-11 DIAGNOSIS — K581 Irritable bowel syndrome with constipation: Secondary | ICD-10-CM | POA: Diagnosis not present

## 2023-07-11 DIAGNOSIS — R140 Abdominal distension (gaseous): Secondary | ICD-10-CM | POA: Diagnosis not present

## 2023-07-11 DIAGNOSIS — R109 Unspecified abdominal pain: Secondary | ICD-10-CM

## 2023-07-11 MED ORDER — HYOSCYAMINE SULFATE 0.125 MG SL SUBL: 0.1250 mg | SUBLINGUAL_TABLET | SUBLINGUAL | 0 refills | Status: DC | PRN

## 2023-07-11 NOTE — Patient Instructions (Addendum)
VISIT SUMMARY:  During your visit, we discussed your recurrent episodes of severe abdominal cramping, bloating, and nausea, which are likely related to your gastroparesis. We also reviewed your diabetes management, which is going well with your insulin pump. We made some changes to your medications to help manage your symptoms.  YOUR PLAN:  -GASTROPARESIS: Gastroparesis is a condition where your stomach takes too long to empty its contents. We suspect that Dulcolax, a medication you've been taking, may be contributing to your severe cramping episodes. We've decided to stop Dulcolax and start Levsin, a medication that can help with your cramping. We'll continue with your current doses of Movantik and Linzess. We'll check in 4 weeks to see how these changes are affecting you.  -DIABETES: Your diabetes is well-managed with your insulin pump and your blood sugar levels are stable. Continue with your current insulin regimen and keep monitoring your blood glucose levels with your Dexcom monitor.  -REGLAN USE: You're currently taking Reglan twice daily. We're going to try reducing this to once daily, then as needed, to avoid overstimulation.  INSTRUCTIONS:  Your next follow-up appointment is scheduled for September 28, 2023, and another one will be scheduled for March 2025. Please continue to monitor your symptoms and reach out if you have any concerns before your next appointment.  I appreciate the  opportunity to care for you  Thank You   Marsa Aris , MD

## 2023-07-11 NOTE — Progress Notes (Signed)
Taylor Adams [facility]    [id number]    1951-03-22  Primary Care Physician:Tisovec, Adelfa Koh, MD  Referring Physician: Gaspar Garbe, MD [address] [place],  [place] [address]   Chief complaint: Gastroparesis, abdominal pain  Discussed the use of AI scribe software for clinical note transcription with the patient, who gave verbal consent to proceed.  History of Present Illness   The patient, with a history of gastroparesis and diabetes managed with an insulin pump, presents with complaints of subacute episodes of severe abdominal cramping, bloating, and nausea within past 3 weeks. She describes the pain as "terrible" and "all over my stomach," which has occurred three times in the past few weeks. The episodes last about an hour and are so severe that she is unable to move and feels on the verge of vomiting. She has been managing these symptoms with Tramadol for pain and has noticed some relief after having a bowel movement.  She has been taking Dulcolax for about three weeks, around the same time the severe cramping episodes started. She also started taking Movantik recently and has been taking Linzess and Reglan. She reports having two to three bowel movements daily, but feels bloated and full if she hasn't had a bowel movement yet that day.  Her diabetes is well-managed with an insulin pump, and she reports that her blood sugars are stable.   GI Hx: EGD July 2022: Mild duodenitis otherwise unremarkable   Small bowel follow-through was negative for bowel obstruction.   She was treated with Xifaxan in 2017 for small intestinal bacterial overgrowth and she has been relatively symptom-free for past few years until recent flare up.       High-grade SBO in January 2015 , she underwent exploratory laparotomy with lysis of adhesions and ileocectomy.   In 2002 she was diagnosed with bacterial overgrowth on a breath Hydrogen test.   She has history of gastroparesis. She was on  Reglan but it was discontinued in 2015 given no significant improvement  Patient had a colonoscopy in 2002 with removal of a tubular adenoma. A repeat colonoscopy in 2007 was normal. Recent colonoscopy in July 2017 was unremarkable as well except for sigmoid diverticulosis .    Other past GI workup include  small bowel follow-through in 2005 was normal. An upper abdominal ultrasound in 2005 was negative. Her celiac profile was normal in 2005      Outpatient Encounter Medications as of 07/11/2023  Medication Sig   ALPRAZolam (XANAX) 0.5 MG tablet Take 1 tablet (0.5 mg total) by mouth 2 (two) times daily as needed for anxiety.   B-D ULTRA-FINE 33 LANCETS MISC    BAYER CONTOUR NEXT TEST test strip    Biotin 5000 MCG TABS Take 5,000 mcg by mouth daily.    budesonide-formoterol (SYMBICORT) 80-4.5 MCG/ACT inhaler Inhale 2 puffs into the lungs 2 (two) times daily.   Cholecalciferol (VITAMIN D3) 1000 units CAPS Take 1,000 Units by mouth 2 (two) times daily.    Continuous Blood Gluc Sensor (DEXCOM G6 SENSOR) MISC USE AS DIRECTED & CHANGE SENSOR EVERY 30 DAYS   cyclobenzaprine (FLEXERIL) 10 MG tablet Take 10 mg by mouth as needed.   GLUCAGEN HYPOKIT 1 MG SOLR injection    hydrOXYzine (ATARAX/VISTARIL) 25 MG tablet Take 25 mg by mouth 3 (three) times daily as needed.   Insulin Human (INSULIN PUMP) 100 unit/ml SOLN Inject into the skin continuous. Novolog 100 unit/mL Basal Rate: 12  am to 4:30 am  0.375 4:30 am to 8 am   0.45 8 am to 12 pm    0.525 12 pm to 3 pm  0.45 3 pm to 9 pm 0.475 9 pm to 12 am 0.425   linaclotide (LINZESS) 145 MCG CAPS capsule TAKE ONE CAPSULE BY MOUTH DAILY BEFORE BREAKFAST   losartan (COZAAR) 50 MG tablet Take 50 mg by mouth daily.   metoCLOPramide (REGLAN) 5 MG tablet Take 1 tablet (5 mg total) by mouth 3 (three) times daily before meals. As needed   Multiple Vitamin (MULTIVITAMIN) tablet Take 1 tablet by mouth daily. Women's 50+   oxyCODONE-acetaminophen (PERCOCET) 10-325  MG tablet Take 1 tablet by mouth every 6 (six) hours as needed for pain.   pantoprazole (PROTONIX) 40 MG tablet TAKE 1 TABLET BY MOUTH TWICE A DAY   Probiotic Product (VSL#3) CAPS Take 1 capsule by mouth daily.   Rimegepant Sulfate (NURTEC) 75 MG TBDP Take 75 mg by mouth daily as needed. For migraines. Take as close to onset of migraine as possible. One daily maximum.   traMADol (ULTRAM) 50 MG tablet Take 50 mg by mouth 2 (two) times daily as needed.   naloxegol oxalate (MOVANTIK) 25 MG TABS tablet Take 1/2 tablet by mouth 12.5mg  for 4 weeks then increase to 1 tablet 25 mg once daily (Patient not taking: Reported on 07/11/2023)   No facility-administered encounter medications on file as of 07/11/2023.    Allergies as of 07/11/2023 - Review Complete 07/11/2023  Allergen Reaction Noted   Pregabalin Other (See Comments) 11/15/2022   Valacyclovir Hives 05/17/2023   Codeine Itching 04/15/2016   Latex Rash 04/15/2016    Past Medical History:  Diagnosis Date   Anxiety    Arthritis    Benign colon polyp    Cataracts, bilateral    Depression    Diabetes mellitus    Diarrhea    Gastroparesis    Headache    migraines   Intestinal bacterial overgrowth    Osteoporosis 2021   PONV (postoperative nausea and vomiting)    history of n/v   Salivary gland disorder    clogged salivary glands   SBO (small bowel obstruction) (HCC)     Past Surgical History:  Procedure Laterality Date   ABDOMINAL HYSTERECTOMY     APPENDECTOMY     BREAST EXCISIONAL BIOPSY Bilateral    benign   BREAST SURGERY     biopsy   CATARACT EXTRACTION Bilateral 2017   Dr. Vonna Kotyk   CATARACT EXTRACTION, BILATERAL     EYE SURGERY     ILEOCECETOMY  10/07/2013   Procedure: Ferne Coe;  Surgeon: Axel Filler, MD;  Location: [facility] OR;  Service: General;;   LAPAROTOMY N/A 10/07/2013   Procedure: EXPLORATORY LAPAROTOMY;  Surgeon: Axel Filler, MD;  Location: [facility] OR;  Service: General;  Laterality: N/A;   LYSIS OF ADHESION   10/07/2013   Procedure: LYSIS OF ADHESION;  Surgeon: Axel Filler, MD;  Location: [facility] OR;  Service: General;;   NASAL SEPTOPLASTY W/ TURBINOPLASTY Bilateral 12/28/2016   Procedure: NASAL SEPTOPLASTY WITH  BILATARAL TURBINATE REDUCTION;  Surgeon: Newman Pies, MD;  Location: [facility] OR;  Service: ENT;  Laterality: Bilateral;   resection of lymph node in neck     ROTATOR CUFF REPAIR     right side   SALIVARY GLAND SURGERY  11/25/2020   SINUS ENDO WITH FUSION Bilateral 12/28/2016   Procedure: ENDOSCOPIC TOTAL ETHMOIDECTOMY,ENDOSCOPIC MAXILLARY ANTROSTOMY ;  Surgeon: Newman Pies, MD;  Location: [facility]  OR;  Service: ENT;  Laterality: Bilateral;   YAG LASER APPLICATION Bilateral 2018    Family History  Problem Relation Age of Onset   Diabetes Mother    Heart disease Mother    Colon polyps Father    Heart disease Father    Diabetes Father    Allergies Sister    Heart disease Brother    Allergies Brother    Colon cancer Neg Hx    Rectal cancer Neg Hx    Stomach cancer Neg Hx    Esophageal cancer Neg Hx    Pancreatic cancer Neg Hx     Social History   Socioeconomic History   Marital status: Married    Spouse name: Not on file   Number of children: Not on file   Years of education: Not on file   Highest education level: Not on file  Occupational History   Not on file  Tobacco Use   Smoking status: Never   Smokeless tobacco: Never  Vaping Use   Vaping status: Never Used  Substance and Sexual Activity   Alcohol use: Yes    Comment: glass of wine "every now and then"   Drug use: No   Sexual activity: Not on file  Other Topics Concern   Not on file  Social History Narrative   Lives at home with her husband   Right handed   Caffeine: 2 cups per day   Social Determinants of Health   Financial Resource Strain: Not on file  Food Insecurity: Not on file  Transportation Needs: Not on file  Physical Activity: Not on file  Stress: Not on file  Social Connections: Unknown (02/11/2022)   Received  from [facility], [facility]   Social Network    Social Network: Not on file  Intimate Partner Violence: Unknown (01/04/2022)   Received from [facility], [facility]   HITS    Physically Hurt: Not on file    Insult or Talk Down To: Not on file    Threaten Physical Harm: Not on file    Scream or Curse: Not on file      Review of systems: All other review of systems negative except as mentioned in the HPI.   Physical Exam: Vitals:   07/11/23 1100  BP: 98/62  Pulse: 72  SpO2: 99%   Body mass index is 19.82 kg/m. Gen:      No acute distress HEENT:  sclera anicteric Abd:      soft, non-tender; no palpable masses, no distension Ext:    No edema Neuro: alert and oriented x 3 Psych: normal mood and affect  Data Reviewed:  Reviewed labs, radiology imaging, old records and pertinent past GI work up   Assessment and Plan/Recommendations:  Assessment and Plan 72 year-old female status post appendectomy and abdominal hysterectomy, high-grade SBO in January 2015 status post ex-lap, ileo cecectomy and lysis of adhesions, insulin-dependent diabetes here with complaints of nausea, abdominal bloating, excessive belching and abdominal cramping    Gastroparesis, IBS constipation and chronic idiopathic constipation New onset of severe abdominal cramping episodes, occurring intermittently for the past three weeks. Episodes last about an hour and are associated with nausea but no vomiting. Suspect Dulcolax may be contributing to these episodes due to its stimulant effect. -Discontinue Dulcolax. -Continue Movantik 12.5mg  daily and Linzess daily. -Start antispasmodic, Levsin 0.125 sublingually as needed for cramping. -Check in 4 weeks to assess response to medication changes. Continue with small frequent meals, gastroparesis diet  Diabetes Stable with A1C of 6.3. Patient has made successful adjustments to insulin dosing and timing. -Continue current insulin  regimen. -Continue monitoring blood glucose levels with Dexcom monitor.  Reglan use Currently taking twice daily. -Attempt to reduce to once daily, then use it as needed, to avoid potential side effects/complications  Follow-up appointment scheduled for September 28, 2023, plan to follow-up every 3 months      This visit required 40 minutes of patient care (this includes precharting, chart review, review of results, face-to-face time used for counseling as well as treatment plan and follow-up. The patient was provided an opportunity to ask questions and all were answered. The patient agreed with the plan and demonstrated an understanding of the instructions.  Iona Beard , MD    CC: Tisovec, Adelfa Koh, MD

## 2023-07-17 ENCOUNTER — Encounter: Payer: Self-pay | Admitting: Gastroenterology
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
## 2023-07-20 ENCOUNTER — Ambulatory Visit (INDEPENDENT_AMBULATORY_CARE_PROVIDER_SITE_OTHER): Payer: Medicare Other | Admitting: Family Medicine

## 2023-07-20 DIAGNOSIS — G43709 Chronic migraine without aura, not intractable, without status migrainosus: Secondary | ICD-10-CM | POA: Diagnosis not present

## 2023-07-20 MED ORDER — ONABOTULINUMTOXINA 200 UNITS IJ SOLR
155.0000 [IU] | Freq: Once | INTRAMUSCULAR | Status: AC
Start: 2023-07-20 — End: 2023-07-20
  Administered 2023-07-20: 175 [IU] via INTRAMUSCULAR

## 2023-07-20 NOTE — Progress Notes (Signed)
Botox- 200 units x 1 vial Lot: [id number] Expiration: 11/2025 NDC: 0102-7253-66  Bacteriostatic 0.9% Sodium Chloride- * mL  Lot: [id number] Expiration: 01/02/2024 NDC: 4259-5638-75  Dx: I43.329 B/B Witnessed by Marcelina Morel, RN

## 2023-08-10 NOTE — Progress Notes (Signed)
[facility] - Clinic Note  08/15/2023     CHIEF COMPLAINT Patient presents for Retina Follow Up    HISTORY OF PRESENT ILLNESS: Taylor Adams is a 72 y.o. female who presents to the clinic today for:   HPI     Retina Follow Up   Patient presents with  Diabetic Retinopathy.  This started years ago.  Duration of 1 year.  I, the attending physician,  performed the HPI with the patient and updated documentation appropriately.        Comments   Patient states the vision is the same. She is using AT's OU PRN. Her blood sugar was 184.       Last edited by Rennis Chris, MD on 08/17/2023  1:33 PM.    pt states she has to wear reading glasses, she states she cannot see anything up close, her vision feels like it has a film over it this morning, her last A1c was 6.3 on 09.29.24  Referring physician: Gaspar Garbe, MD [address] [place],  [place] [address]  HISTORICAL INFORMATION:  Selected notes from the MEDICAL RECORD NUMBER Referred by self for DM exam LEE:  Ocular Hx- PMH-   CURRENT MEDICATIONS: No current outpatient medications on file. (Ophthalmic Drugs)   No current facility-administered medications for this visit. (Ophthalmic Drugs)   Current Outpatient Medications (Other)  Medication Sig   ALPRAZolam (XANAX) 0.5 MG tablet Take 1 tablet (0.5 mg total) by mouth 2 (two) times daily as needed for anxiety.   B-D ULTRA-FINE 33 LANCETS MISC    BAYER CONTOUR NEXT TEST test strip    Biotin 5000 MCG TABS Take 5,000 mcg by mouth daily.    bisacodyl 5 MG EC tablet Take 5 mg by mouth at bedtime.   budesonide-formoterol (SYMBICORT) 80-4.5 MCG/ACT inhaler Inhale 2 puffs into the lungs 2 (two) times daily.   Cholecalciferol (VITAMIN D3) 1000 units CAPS Take 1,000 Units by mouth 2 (two) times daily.    Continuous Blood Gluc Sensor (DEXCOM G6 SENSOR) MISC USE AS DIRECTED & CHANGE SENSOR EVERY 30 DAYS   cyclobenzaprine (FLEXERIL) 10 MG tablet Take 10 mg  by mouth as needed.   GLUCAGEN HYPOKIT 1 MG SOLR injection    hydrOXYzine (ATARAX/VISTARIL) 25 MG tablet Take 25 mg by mouth 3 (three) times daily as needed.   hyoscyamine (HYOMAX-SL) 0.125 MG SL tablet Place 1 tablet (0.125 mg total) under the tongue 3 (three) times daily as needed for cramping.   hyoscyamine (LEVSIN/SL) 0.125 MG SL tablet Place 1 tablet (0.125 mg total) under the tongue every 4 (four) hours as needed. 1-2 tablets   Insulin Human (INSULIN PUMP) 100 unit/ml SOLN Inject into the skin continuous. Novolog 100 unit/mL Basal Rate: 12 am to 4:30 am  0.375 4:30 am to 8 am   0.45 8 am to 12 pm    0.525 12 pm to 3 pm  0.45 3 pm to 9 pm 0.475 9 pm to 12 am 0.425   linaclotide (LINZESS) 145 MCG CAPS capsule TAKE ONE CAPSULE BY MOUTH DAILY BEFORE BREAKFAST   losartan (COZAAR) 50 MG tablet Take 50 mg by mouth daily.   metoCLOPramide (REGLAN) 5 MG tablet Take 1 tablet (5 mg total) by mouth 3 (three) times daily before meals. As needed   Multiple Vitamin (MULTIVITAMIN) tablet Take 1 tablet by mouth daily. Women's 50+   naloxegol oxalate (MOVANTIK) 25 MG TABS tablet Take 1/2 tablet by mouth 12.5mg  for 4 weeks then  increase to 1 tablet 25 mg once daily   oxyCODONE-acetaminophen (PERCOCET) 10-325 MG tablet Take 1 tablet by mouth every 6 (six) hours as needed for pain.   pantoprazole (PROTONIX) 40 MG tablet TAKE 1 TABLET BY MOUTH TWICE A DAY   Probiotic Product (VSL#3) CAPS Take 1 capsule by mouth daily.   Rimegepant Sulfate (NURTEC) 75 MG TBDP Take 75 mg by mouth daily as needed. For migraines. Take as close to onset of migraine as possible. One daily maximum.   traMADol (ULTRAM) 50 MG tablet Take 50 mg by mouth 2 (two) times daily as needed.   No current facility-administered medications for this visit. (Other)   REVIEW OF SYSTEMS: ROS   Positive for: Gastrointestinal, Endocrine Negative for: Constitutional, Neurological, Skin, Genitourinary, Musculoskeletal, HENT, Cardiovascular, Eyes,  Respiratory, Psychiatric, Allergic/Imm, Heme/Lymph Last edited by Charlette Caffey, COT on 08/15/2023  8:49 AM.      ALLERGIES Allergies  Allergen Reactions   Pregabalin Other (See Comments)    Despression, "makes me feel hopeless."   Valacyclovir Hives   Codeine Itching    Pt denies this is an allergy   Latex Rash    PAST MEDICAL HISTORY Past Medical History:  Diagnosis Date   Anxiety    Arthritis    Benign colon polyp    Cataracts, bilateral    Depression    Diabetes mellitus    Diarrhea    Gastroparesis    Headache    migraines   Intestinal bacterial overgrowth    Osteoporosis 2021   PONV (postoperative nausea and vomiting)    history of n/v   Salivary gland disorder    clogged salivary glands   SBO (small bowel obstruction) (HCC)    Past Surgical History:  Procedure Laterality Date   ABDOMINAL HYSTERECTOMY     APPENDECTOMY     BREAST EXCISIONAL BIOPSY Bilateral    benign   BREAST SURGERY     biopsy   CATARACT EXTRACTION Bilateral 2017   Dr. Vonna Kotyk   CATARACT EXTRACTION, BILATERAL     EYE SURGERY     ILEOCECETOMY  10/07/2013   Procedure: Ferne Coe;  Surgeon: Axel Filler, MD;  Location: [facility] OR;  Service: General;;   LAPAROTOMY N/A 10/07/2013   Procedure: EXPLORATORY LAPAROTOMY;  Surgeon: Axel Filler, MD;  Location: [facility] OR;  Service: General;  Laterality: N/A;   LYSIS OF ADHESION  10/07/2013   Procedure: LYSIS OF ADHESION;  Surgeon: Axel Filler, MD;  Location: [facility] OR;  Service: General;;   NASAL SEPTOPLASTY W/ TURBINOPLASTY Bilateral 12/28/2016   Procedure: NASAL SEPTOPLASTY WITH  BILATARAL TURBINATE REDUCTION;  Surgeon: Newman Pies, MD;  Location: [facility] OR;  Service: ENT;  Laterality: Bilateral;   resection of lymph node in neck     ROTATOR CUFF REPAIR     right side   SALIVARY GLAND SURGERY  11/25/2020   SINUS ENDO WITH FUSION Bilateral 12/28/2016   Procedure: ENDOSCOPIC TOTAL ETHMOIDECTOMY,ENDOSCOPIC MAXILLARY ANTROSTOMY ;  Surgeon: Newman Pies, MD;   Location: [facility] OR;  Service: ENT;  Laterality: Bilateral;   YAG LASER APPLICATION Bilateral 2018   FAMILY HISTORY Family History  Problem Relation Age of Onset   Diabetes Mother    Heart disease Mother    Colon polyps Father    Heart disease Father    Diabetes Father    Allergies Sister    Heart disease Brother    Allergies Brother    Colon cancer Neg Hx    Rectal cancer Neg Hx  Stomach cancer Neg Hx    Esophageal cancer Neg Hx    Pancreatic cancer Neg Hx    SOCIAL HISTORY Social History   Tobacco Use   Smoking status: Never   Smokeless tobacco: Never  Vaping Use   Vaping status: Never Used  Substance Use Topics   Alcohol use: Yes    Comment: glass of wine "every now and then"   Drug use: No       OPHTHALMIC EXAM: Base Eye Exam     Visual Acuity (Snellen - Linear)       Right Left   Dist [place] 20/25 20/20   Dist ph [place] NI          Tonometry (Tonopen, 8:52 AM)       Right Left   Pressure 14 18         Pupils       Dark Light Shape React APD   Right 3 2 Round Brisk None   Left 3 2 Round Brisk None         Visual Fields       Left Right    Full Full         Extraocular Movement       Right Left    Full, Ortho Full, Ortho         Neuro/Psych     Oriented x3: Yes   Mood/Affect: Normal         Dilation     Both eyes: 1.0% Mydriacyl, 2.5% Phenylephrine @ 8:50 AM           Slit Lamp and Fundus Exam     Slit Lamp Exam       Right Left   Lids/Lashes Dermatochalasis - upper lid Dermatochalasis - upper lid   Conjunctiva/Sclera White and quiet White and quiet   Cornea Well healed temporal cataract wounds, trace Punctate epithelial erosions, arcus Well healed temporal cataract wounds   Anterior Chamber deep and clear deep and clear   Iris Round and dilated, No NVI Round and dilated, No NVI   Lens PC IOL in good position, trace Posterior capsular opacification PC IOL in good position with open PC   Anterior Vitreous Mild  syneresis, vitreous condensations Vitreous syneresis, Posterior vitreous detachment, vitreous condensations         Fundus Exam       Right Left   Disc Compact, mild temporal Peripapillary atrophy, Pink and Sharp Compact, mild tilt, temporal PPA, Pink and Sharp   C/D Ratio 0.2 0.3   Macula Flat, Blunted foveal reflex, mild Retinal pigment epithelial mottling, No heme or edema Flat, blunted foveal reflex, mild Retinal pigment epithelial mottling, No heme or edema   Vessels mild attenuation, mild tortuosity mild attenuation, mild tortuosity   Periphery Attached, rare MA, mild pigmented cystoid degeneration Attached, rare MA greatest temporal periphery, mild pigmented cystoid degeneration           IMAGING AND PROCEDURES  Imaging and Procedures for @TODAY @  OCT, Retina - OU - Both Eyes       Right Eye Quality was good. Central Foveal Thickness: 296. Progression has been stable. Findings include normal foveal contour, no IRF, no SRF.   Left Eye Quality was good. Central Foveal Thickness: 298. Progression has been stable. Findings include normal foveal contour, no IRF, no SRF.   Notes *Images captured and stored on drive  Diagnosis / Impression:  NFP, no IRF/SRF OU No DME OU  Clinical management:  See  below  Abbreviations: NFP - Normal foveal profile. CME - cystoid macular edema. PED - pigment epithelial detachment. IRF - intraretinal fluid. SRF - subretinal fluid. EZ - ellipsoid zone. ERM - epiretinal membrane. ORA - outer retinal atrophy. ORT - outer retinal tubulation. SRHM - subretinal hyper-reflective material            ASSESSMENT/PLAN:    ICD-10-CM   1. Mild nonproliferative diabetic retinopathy of both eyes without macular edema associated with type 1 diabetes mellitus (HCC)  E10.3293 OCT, Retina - OU - Both Eyes    2. Essential hypertension  I10     3. Hypertensive retinopathy of both eyes  H35.033     4. Pseudophakia of both eyes  Z96.1     5. History  of myopia  Z86.69     6. Intractable chronic migraine without aura and with status migrainosus  G43.711     7. Visual disturbances  H53.9      1,2. Diabetes mellitus, type 1 with mild nonproliferative diabetic retinopathy OU -- stable  - last A1c was 6.3 on 09.29.24 -- per pt report  - exam shows just mild peripheral MA OU, no DME or MA in maculae OU  - The incidence, risk factors for progression, natural history and treatment options for diabetic retinopathy  were discussed with patient.    - The need for close monitoring of blood glucose, blood pressure, and serum lipids, avoiding cigarette or any type of tobacco, and the need for long term follow up was also discussed with patient.  - f/u 1 year, sooner prn  3,4. Hypertensive retinopathy OU  - discussed importance of tight BP control  - continue to monitor  5. Pseudophakia OU  - s/p CE/IOL OU (Bevis)  - s/p YAG cap OS (Bevis)  - beautiful surgeries, doing well  - continue to monitor  6. History of Myopia  - high myopia prior to cataract surgery  - myopic contour noted on OCT  7. Migraine with visual disturbances OS  - pt with history of intractable migraine w/ complaint of "fog/film" in left visual field  - following closely with Neurology  - nothing on dilated eye exam to correlate visual disturbance and on our objective testing is seeing 20/20 and no significant ocular findings  - recommend continued management with Neurology  Ophthalmic Meds Ordered this visit:  No orders of the defined types were placed in this encounter.     Return in about 1 year (around 08/14/2024) for f/u DM exam, DFE, OCT.  There are no Patient Instructions on file for this visit.  This document serves as a record of services personally performed by Karie Chimera, MD, PhD. It was created on their behalf by Charlette Caffey, COT an ophthalmic technician. The creation of this record is the provider's dictation and/or activities during the  visit.    Electronically signed by:  Charlette Caffey, COT  08/17/23 1:41 PM  This document serves as a record of services personally performed by Karie Chimera, MD, PhD. It was created on their behalf by Glee Arvin. Manson Passey, OA an ophthalmic technician. The creation of this record is the provider's dictation and/or activities during the visit.    Electronically signed by: Glee Arvin. Manson Passey, OA 08/17/23 1:41 PM  Karie Chimera, M.D., Ph.D. Diseases & Surgery of the Retina and Vitreous [facility] 08/15/2023   I have reviewed the above documentation for accuracy and completeness, and I agree with  the above. Karie Chimera, M.D., Ph.D. 08/17/23 1:42 PM   Abbreviations: M myopia (nearsighted); A astigmatism; H hyperopia (farsighted); P presbyopia; Mrx spectacle prescription;  CTL contact lenses; OD right eye; OS left eye; OU both eyes  XT exotropia; ET esotropia; PEK punctate epithelial keratitis; PEE punctate epithelial erosions; DES dry eye syndrome; MGD meibomian gland dysfunction; ATs artificial tears; PFAT's preservative free artificial tears; NSC nuclear sclerotic cataract; PSC posterior subcapsular cataract; ERM epi-retinal membrane; PVD posterior vitreous detachment; RD retinal detachment; DM diabetes mellitus; DR diabetic retinopathy; NPDR non-proliferative diabetic retinopathy; PDR proliferative diabetic retinopathy; CSME clinically significant macular edema; DME diabetic macular edema; dbh dot blot hemorrhages; CWS cotton wool spot; POAG primary open angle glaucoma; C/D cup-to-disc ratio; HVF humphrey visual field; GVF goldmann visual field; OCT optical coherence tomography; IOP intraocular pressure; BRVO Branch retinal vein occlusion; CRVO central retinal vein occlusion; CRAO central retinal artery occlusion; BRAO branch retinal artery occlusion; RT retinal tear; SB scleral buckle; PPV pars plana vitrectomy; VH Vitreous hemorrhage; PRP panretinal laser  photocoagulation; IVK intravitreal kenalog; VMT vitreomacular traction; MH Macular hole;  NVD neovascularization of the disc; NVE neovascularization elsewhere; AREDS age related eye disease study; ARMD age related macular degeneration; POAG primary open angle glaucoma; EBMD epithelial/anterior basement membrane dystrophy; ACIOL anterior chamber intraocular lens; IOL intraocular lens; PCIOL posterior chamber intraocular lens; Phaco/IOL phacoemulsification with intraocular lens placement; PRK photorefractive keratectomy; LASIK laser assisted in situ keratomileusis; HTN hypertension; DM diabetes mellitus; COPD chronic obstructive pulmonary disease

## 2023-08-11 ENCOUNTER — Encounter (INDEPENDENT_AMBULATORY_CARE_PROVIDER_SITE_OTHER): Payer: Medicare Other | Admitting: Ophthalmology

## 2023-08-15 ENCOUNTER — Ambulatory Visit (INDEPENDENT_AMBULATORY_CARE_PROVIDER_SITE_OTHER): Payer: Medicare Other | Admitting: Ophthalmology

## 2023-08-15 ENCOUNTER — Encounter (INDEPENDENT_AMBULATORY_CARE_PROVIDER_SITE_OTHER): Payer: Self-pay | Admitting: Ophthalmology

## 2023-08-15 DIAGNOSIS — H539 Unspecified visual disturbance: Secondary | ICD-10-CM

## 2023-08-15 DIAGNOSIS — Z961 Presence of intraocular lens: Secondary | ICD-10-CM

## 2023-08-15 DIAGNOSIS — I10 Essential (primary) hypertension: Secondary | ICD-10-CM

## 2023-08-15 DIAGNOSIS — H35033 Hypertensive retinopathy, bilateral: Secondary | ICD-10-CM | POA: Diagnosis not present

## 2023-08-15 DIAGNOSIS — E103293 Type 1 diabetes mellitus with mild nonproliferative diabetic retinopathy without macular edema, bilateral: Secondary | ICD-10-CM

## 2023-08-15 DIAGNOSIS — Z8669 Personal history of other diseases of the nervous system and sense organs: Secondary | ICD-10-CM | POA: Diagnosis not present

## 2023-08-15 DIAGNOSIS — G43711 Chronic migraine without aura, intractable, with status migrainosus: Secondary | ICD-10-CM

## 2023-08-17 ENCOUNTER — Encounter (INDEPENDENT_AMBULATORY_CARE_PROVIDER_SITE_OTHER): Payer: Self-pay | Admitting: Ophthalmology

## 2023-09-18 DIAGNOSIS — E1040 Type 1 diabetes mellitus with diabetic neuropathy, unspecified: Secondary | ICD-10-CM | POA: Diagnosis not present

## 2023-09-18 DIAGNOSIS — M810 Age-related osteoporosis without current pathological fracture: Secondary | ICD-10-CM | POA: Diagnosis not present

## 2023-09-18 DIAGNOSIS — I10 Essential (primary) hypertension: Secondary | ICD-10-CM | POA: Diagnosis not present

## 2023-09-18 DIAGNOSIS — E7800 Pure hypercholesterolemia, unspecified: Secondary | ICD-10-CM | POA: Diagnosis not present

## 2023-09-22 DIAGNOSIS — K3184 Gastroparesis: Secondary | ICD-10-CM | POA: Diagnosis not present

## 2023-09-22 DIAGNOSIS — E1043 Type 1 diabetes mellitus with diabetic autonomic (poly)neuropathy: Secondary | ICD-10-CM | POA: Diagnosis not present

## 2023-09-22 DIAGNOSIS — Z1339 Encounter for screening examination for other mental health and behavioral disorders: Secondary | ICD-10-CM | POA: Diagnosis not present

## 2023-09-22 DIAGNOSIS — M5030 Other cervical disc degeneration, unspecified cervical region: Secondary | ICD-10-CM | POA: Diagnosis not present

## 2023-09-22 DIAGNOSIS — E7800 Pure hypercholesterolemia, unspecified: Secondary | ICD-10-CM | POA: Diagnosis not present

## 2023-09-22 DIAGNOSIS — Z1331 Encounter for screening for depression: Secondary | ICD-10-CM | POA: Diagnosis not present

## 2023-09-22 DIAGNOSIS — M810 Age-related osteoporosis without current pathological fracture: Secondary | ICD-10-CM | POA: Diagnosis not present

## 2023-09-22 DIAGNOSIS — Z4681 Encounter for fitting and adjustment of insulin pump: Secondary | ICD-10-CM | POA: Diagnosis not present

## 2023-09-22 DIAGNOSIS — E103293 Type 1 diabetes mellitus with mild nonproliferative diabetic retinopathy without macular edema, bilateral: Secondary | ICD-10-CM | POA: Diagnosis not present

## 2023-09-22 DIAGNOSIS — L209 Atopic dermatitis, unspecified: Secondary | ICD-10-CM | POA: Diagnosis not present

## 2023-09-22 DIAGNOSIS — Z0000 Encounter for general adult medical examination without abnormal findings: Secondary | ICD-10-CM | POA: Diagnosis not present

## 2023-09-22 DIAGNOSIS — G43711 Chronic migraine without aura, intractable, with status migrainosus: Secondary | ICD-10-CM | POA: Diagnosis not present

## 2023-09-22 DIAGNOSIS — J45991 Cough variant asthma: Secondary | ICD-10-CM | POA: Diagnosis not present

## 2023-09-22 DIAGNOSIS — I10 Essential (primary) hypertension: Secondary | ICD-10-CM | POA: Diagnosis not present

## 2023-09-28 ENCOUNTER — Ambulatory Visit: Payer: Medicare Other | Admitting: Gastroenterology

## 2023-10-04 ENCOUNTER — Other Ambulatory Visit: Payer: Self-pay | Admitting: Nurse Practitioner
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
## 2023-10-17 ENCOUNTER — Ambulatory Visit (INDEPENDENT_AMBULATORY_CARE_PROVIDER_SITE_OTHER): Payer: Medicare Other | Admitting: Family Medicine

## 2023-10-17 VITALS — BP 107/72 | HR 82

## 2023-10-17 DIAGNOSIS — G43709 Chronic migraine without aura, not intractable, without status migrainosus: Secondary | ICD-10-CM | POA: Diagnosis not present

## 2023-10-17 DIAGNOSIS — G43711 Chronic migraine without aura, intractable, with status migrainosus: Secondary | ICD-10-CM

## 2023-10-17 MED ORDER — OXYCODONE-ACETAMINOPHEN 10-325 MG PO TABS: 1.0000 | ORAL_TABLET | Freq: Four times a day (QID) | ORAL | 0 refills | Status: AC | PRN

## 2023-10-17 MED ORDER — ONABOTULINUMTOXINA 200 UNITS IJ SOLR
155.0000 [IU] | Freq: Once | INTRAMUSCULAR | Status: AC
Start: 2023-10-17 — End: 2023-10-17
  Administered 2023-10-17: 155 [IU] via INTRAMUSCULAR

## 2023-10-17 NOTE — Progress Notes (Signed)
 Botox- 200 units x 1 vial Lot: [id number] Expiration: 01/2026 NDC: 8469-6295 -02   Bacteriostatic 0.9% Sodium Chloride- 4mL total MWU:[id number] Expiration: 08/03/24 NDC: 1027-2536-64   Dx: G43.709 BB   Witnessed by:  Hampton Abbot

## 2023-10-19 ENCOUNTER — Other Ambulatory Visit: Payer: Self-pay | Admitting: Gastroenterology

## 2023-11-02 DIAGNOSIS — E1040 Type 1 diabetes mellitus with diabetic neuropathy, unspecified: Secondary | ICD-10-CM | POA: Diagnosis not present

## 2023-11-02 DIAGNOSIS — I10 Essential (primary) hypertension: Secondary | ICD-10-CM | POA: Diagnosis not present

## 2023-11-02 DIAGNOSIS — R82998 Other abnormal findings in urine: Secondary | ICD-10-CM | POA: Diagnosis not present

## 2023-11-20 DIAGNOSIS — H524 Presbyopia: Secondary | ICD-10-CM | POA: Diagnosis not present

## 2023-11-20 DIAGNOSIS — H04123 Dry eye syndrome of bilateral lacrimal glands: Secondary | ICD-10-CM | POA: Diagnosis not present

## 2023-11-20 DIAGNOSIS — E109 Type 1 diabetes mellitus without complications: Secondary | ICD-10-CM | POA: Diagnosis not present

## 2023-11-30 ENCOUNTER — Encounter: Payer: Self-pay | Admitting: Gastroenterology

## 2023-11-30 ENCOUNTER — Ambulatory Visit (INDEPENDENT_AMBULATORY_CARE_PROVIDER_SITE_OTHER): Payer: Medicare Other | Admitting: Gastroenterology

## 2023-11-30 VITALS — BP 118/62 | HR 80 | Ht 62.0 in | Wt 107.0 lb

## 2023-11-30 DIAGNOSIS — K581 Irritable bowel syndrome with constipation: Secondary | ICD-10-CM | POA: Diagnosis not present

## 2023-11-30 DIAGNOSIS — E119 Type 2 diabetes mellitus without complications: Secondary | ICD-10-CM

## 2023-11-30 DIAGNOSIS — K3184 Gastroparesis: Secondary | ICD-10-CM

## 2023-11-30 DIAGNOSIS — E1143 Type 2 diabetes mellitus with diabetic autonomic (poly)neuropathy: Secondary | ICD-10-CM

## 2023-11-30 DIAGNOSIS — Z8719 Personal history of other diseases of the digestive system: Secondary | ICD-10-CM

## 2023-11-30 NOTE — Patient Instructions (Addendum)
 VISIT SUMMARY:  Today, we reviewed your management plan for gastroparesis, bowel obstruction risk, and diabetes. Your diabetes is well-controlled, and we discussed ways to continue managing your gastroparesis and prevent bowel obstructions.  YOUR PLAN:  -GASTROPARESIS: Gastroparesis is a condition where the stomach takes longer to empty its contents. You are managing it well with dietary modifications and medications. Continue your current medication regimen, minimize the use of metoclopramide due to potential side effects, and incorporate well-cooked vegetables into your diet. Gentle abdominal massage and diluted peppermint oil may also help relieve symptoms.  -BOWEL OBSTRUCTION RISK: You have a risk of bowel obstruction due to scar tissue from previous surgeries. To manage this, continue with gentle abdominal massages and avoid high-fiber and raw foods. Monitor for symptoms of bowel obstruction and seek medical attention if they occur.  -DIABETES MELLITUS TYPE 2: Your diabetes is well-controlled with an HbA1c of 6.3%. Continue with your current management plan, including the use of the Dexcom sensor for continuous glucose monitoring. Aim to keep your HbA1c between 6-6.5% and maintain a balanced diet.  -GENERAL HEALTH MAINTENANCE: Your overall health is well-managed, with good control of diabetes and gastroparesis. Your cholesterol levels are slightly elevated but balanced by high HDL. No additional lab work is needed at this time. Continue with your current health maintenance plan.  INSTRUCTIONS:  Please schedule a follow-up appointment in six months. Use MyChart to list your preferred dates for scheduling.  I appreciate the  opportunity to care for you  Thank You   Marsa Aris , MD

## 2023-11-30 NOTE — Progress Notes (Signed)
 [facility] M[facility]    [id number]    1951-03-03  Primary Care Physician:Tisovec, Adelfa Koh, MD  Referring Physician: Gaspar Garbe, MD [address] [place],  [place] [address]   Chief complaint: Gastroparesis, constipation  Discussed the use of AI scribe software for clinical note transcription with the patient, who gave verbal consent to proceed.  History of Present Illness   Taylor Adams is a 73 year old female with diabetes and gastroparesis who presents for a follow-up visit.  She manages her gastroparesis with dietary modifications and medications. She tolerates grilled shrimp, salmon, eggs, pancakes, potatoes, and smoothies with strawberries, banana, and whey protein. Steamed vegetables like cabbage and spinach are acceptable if well-cooked. She avoids raw or high-fiber foods to prevent bowel obstruction. Bloating occurs with Movantik, so she switched back to Dulcolax, taking one at night, along with Linzess in the morning, and metoclopramide as needed. She also takes pantoprazole and diclomate for spasms, though she needs it less frequently.  Her diabetes is well-controlled with an A1c of 6.3, improved from previous levels of 6.6 and 6.1. She has increased her basal insulin to maintain blood sugar levels and uses a Dexcom sensor for continuous glucose monitoring, which reduces the need for frequent finger pricks. Her husband can also monitor her glucose levels through the sensor.  She has a history of bowel obstruction and scar tissue from previous surgeries, causing occasional abdominal swelling. She manages this by massaging the area to relieve discomfort. She takes measures to prevent bowel obstruction by maintaining regular bowel movements and controlling her blood sugar levels.        GI Hx: EGD July 2022: Mild duodenitis otherwise unremarkable   Small bowel follow-through was negative for bowel obstruction.   She was treated with Xifaxan in 2017 for small  intestinal bacterial overgrowth and she has been relatively symptom-free for past few years until recent flare up.       High-grade SBO in January 2015 , she underwent exploratory laparotomy with lysis of adhesions and ileocectomy.   In 2002 she was diagnosed with bacterial overgrowth on a breath Hydrogen test.   She has history of gastroparesis. She was on Reglan but it was discontinued in 2015 given no significant improvement  Patient had a colonoscopy in 2002 with removal of a tubular adenoma. A repeat colonoscopy in 2007 was normal. Recent colonoscopy in July 2017 was unremarkable as well except for sigmoid diverticulosis .    Other past GI workup include  small bowel follow-through in 2005 was normal. An upper abdominal ultrasound in 2005 was negative. Her celiac profile was normal in 2005    Outpatient Encounter Medications as of 11/30/2023  Medication Sig   ALPRAZolam (XANAX) 0.5 MG tablet Take 1 tablet (0.5 mg total) by mouth 2 (two) times daily as needed for anxiety.   B-D ULTRA-FINE 33 LANCETS MISC    BAYER CONTOUR NEXT TEST test strip    Biotin 5000 MCG TABS Take 5,000 mcg by mouth daily.    bisacodyl 5 MG EC tablet Take 5 mg by mouth at bedtime.   budesonide-formoterol (SYMBICORT) 80-4.5 MCG/ACT inhaler Inhale 2 puffs into the lungs 2 (two) times daily.   Cholecalciferol (VITAMIN D3) 1000 units CAPS Take 1,000 Units by mouth 2 (two) times daily.    Continuous Blood Gluc Sensor (DEXCOM G6 SENSOR) MISC USE AS DIRECTED & CHANGE SENSOR EVERY 30 DAYS   cyclobenzaprine (FLEXERIL) 10 MG tablet Take  10 mg by mouth as needed.   GLUCAGEN HYPOKIT 1 MG SOLR injection    hydrOXYzine (ATARAX/VISTARIL) 25 MG tablet Take 25 mg by mouth 3 (three) times daily as needed.   hyoscyamine (LEVSIN/SL) 0.125 MG SL tablet Place 1 tablet (0.125 mg total) under the tongue every 4 (four) hours as needed. 1-2 tablets   Insulin Human (INSULIN PUMP) 100 unit/ml SOLN Inject into the skin continuous. Novolog 100  unit/mL Basal Rate: 12 am to 4:30 am  0.375 4:30 am to 8 am   0.45 8 am to 12 pm    0.525 12 pm to 3 pm  0.45 3 pm to 9 pm 0.475 9 pm to 12 am 0.425   linaclotide (LINZESS) 145 MCG CAPS capsule TAKE ONE CAPSULE BY MOUTH DAILY BEFORE BREAKFAST   losartan (COZAAR) 50 MG tablet Take 50 mg by mouth daily.   metoCLOPramide (REGLAN) 5 MG tablet Take 1 tablet (5 mg total) by mouth in the morning and at bedtime.   Multiple Vitamin (MULTIVITAMIN) tablet Take 1 tablet by mouth daily. Women's 50+   oxyCODONE-acetaminophen (PERCOCET) 10-325 MG tablet Take 1 tablet by mouth every 6 (six) hours as needed for pain.   pantoprazole (PROTONIX) 40 MG tablet TAKE 1 TABLET BY MOUTH 2 TIMES A DAY   Probiotic Product (VSL#3) CAPS Take 1 capsule by mouth daily.   Rimegepant Sulfate (NURTEC) 75 MG TBDP Take 75 mg by mouth daily as needed. For migraines. Take as close to onset of migraine as possible. One daily maximum.   traMADol (ULTRAM) 50 MG tablet Take 50 mg by mouth 2 (two) times daily as needed.   naloxegol oxalate (MOVANTIK) 25 MG TABS tablet Take 1/2 tablet by mouth 12.5mg  for 4 weeks then increase to 1 tablet 25 mg once daily (Patient not taking: Reported on 11/30/2023)   No facility-administered encounter medications on file as of 11/30/2023.    Allergies as of 11/30/2023 - Review Complete 11/30/2023  Allergen Reaction Noted   Pregabalin Other (See Comments) 11/15/2022   Valacyclovir Hives 05/17/2023   Codeine Itching 04/15/2016   Latex Rash 04/15/2016    Past Medical History:  Diagnosis Date   Anxiety    Arthritis    Benign colon polyp    Cataracts, bilateral    Depression    Diabetes mellitus    Diarrhea    Gastroparesis    Headache    migraines   Intestinal bacterial overgrowth    Osteoporosis 2021   PONV (postoperative nausea and vomiting)    history of n/v   Salivary gland disorder    clogged salivary glands   SBO (small bowel obstruction) (HCC)     Past Surgical History:   Procedure Laterality Date   ABDOMINAL HYSTERECTOMY     APPENDECTOMY     BREAST EXCISIONAL BIOPSY Bilateral    benign   BREAST SURGERY     biopsy   CATARACT EXTRACTION Bilateral 2017   Dr. Vonna Kotyk   CATARACT EXTRACTION, BILATERAL     EYE SURGERY     ILEOCECETOMY  10/07/2013   Procedure: Ferne Coe;  Surgeon: Axel Filler, MD;  Location: [facility] OR;  Service: General;;   LAPAROTOMY N/A 10/07/2013   Procedure: EXPLORATORY LAPAROTOMY;  Surgeon: Axel Filler, MD;  Location: [facility] OR;  Service: General;  Laterality: N/A;   LYSIS OF ADHESION  10/07/2013   Procedure: LYSIS OF ADHESION;  Surgeon: Axel Filler, MD;  Location: [facility] OR;  Service: General;;   NASAL SEPTOPLASTY W/ TURBINOPLASTY Bilateral 12/28/2016  Procedure: NASAL SEPTOPLASTY WITH  BILATARAL TURBINATE REDUCTION;  Surgeon: Newman Pies, MD;  Location: [facility] OR;  Service: ENT;  Laterality: Bilateral;   resection of lymph node in neck     ROTATOR CUFF REPAIR     right side   SALIVARY GLAND SURGERY  11/25/2020   SINUS ENDO WITH FUSION Bilateral 12/28/2016   Procedure: ENDOSCOPIC TOTAL ETHMOIDECTOMY,ENDOSCOPIC MAXILLARY ANTROSTOMY ;  Surgeon: Newman Pies, MD;  Location: [facility] OR;  Service: ENT;  Laterality: Bilateral;   YAG LASER APPLICATION Bilateral 2018    Family History  Problem Relation Age of Onset   Diabetes Mother    Heart disease Mother    Colon polyps Father    Heart disease Father    Diabetes Father    Allergies Sister    Heart disease Brother    Allergies Brother    Colon cancer Neg Hx    Rectal cancer Neg Hx    Stomach cancer Neg Hx    Esophageal cancer Neg Hx    Pancreatic cancer Neg Hx     Social History   Socioeconomic History   Marital status: Married    Spouse name: Not on file   Number of children: Not on file   Years of education: Not on file   Highest education level: Not on file  Occupational History   Not on file  Tobacco Use   Smoking status: Never   Smokeless tobacco: Never  Vaping Use   Vaping status:  Never Used  Substance and Sexual Activity   Alcohol use: Yes    Comment: glass of wine "every now and then"   Drug use: No   Sexual activity: Not on file  Other Topics Concern   Not on file  Social History Narrative   Lives at home with her husband   Right handed   Caffeine: 2 cups per day   Social Drivers of Corporate investment banker Strain: Not on file  Food Insecurity: Not on file  Transportation Needs: Not on file  Physical Activity: Not on file  Stress: Not on file  Social Connections: Unknown (02/11/2022)   Received [facility]   Social Network    Social Network: Not on file  Intimate Partner Violence: Unknown (01/04/2022)   Received from [facility], [facility]   HITS    Physically Hurt: Not on file    Insult or Talk Down To: Not on file    Threaten Physical Harm: Not on file    Scream or Curse: Not on file      Review of systems: All other review of systems negative except as mentioned in the HPI.   Physical Exam: Vitals:   11/30/23 0819  BP: 118/62  Pulse: 80   Body mass index is 19.57 kg/m. Gen:      No acute distress HEENT:  sclera anicteric CV: s1s2 rrr, no murmur Lungs: B/l clear. Abd:      soft, non-tender; no palpable masses, no distension Ext:    No edema Neuro: alert and oriented x 3 Psych: normal mood and affect  Data Reviewed:  Reviewed labs, radiology imaging, old records and pertinent past GI work up     Assessment and Plan 73 year-old female status post appendectomy and abdominal hysterectomy, high-grade SBO in January 2015 status post ex-lap, ileo cecectomy and lysis of adhesions, insulin-dependent diabetes here with complaints of nausea, abdominal bloating, excessive belching and abdominal cramping     Gastroparesis   Tolerates  certain foods like grilled shrimp, salmon, eggs, pancakes, potatoes, and smoothies. Avoids high-fiber and raw foods to prevent symptom exacerbation. On Dulcolax, Linzess,  dicyclomine, metoclopramide, and pantoprazole. Continue with monitoring and insulin pump for better glycemic control to improve diabetes induced gastroparesis Advised to minimize metoclopramide due to potential neurological side effects. Discussed benefits of adding well-cooked vegetables, gentle abdominal massage, and diluted peppermint oil for symptom relief.   - Continue current medication regimen   - Minimize metoclopramide use   - Encourage well-cooked vegetables   - Recommend gentle abdominal massage and diluted peppermint oil    Bowel Obstruction Risk   Significant scar tissue from multiple surgeries causing occasional abdominal swelling, likely due to adhesions and partial obstructions. Advised gentle massage and dietary adjustments to prevent full obstruction. Emphasized avoiding high-fiber and raw foods and monitoring for obstruction symptoms.   - Advise gentle abdominal massage   - Recommend avoiding high-fiber and raw foods   - Monitor for bowel obstruction symptoms and seek medical attention if she occurs    IBS constipation: Continue Linzess 145 mcg daily  Diabetes Mellitus Type 2   HbA1c at 6.3%, within target range of 6-6.5%. Continuous glucose monitoring with Dexcom sensor aiding in stable blood glucose levels. Managing well with dietary adjustments and monitoring. Discussed maintaining HbA1c < 6-6.5% and benefits of continuous glucose monitoring.  - Continue current diabetes management plan   - Maintain HbA1c < 6.5%   - Advise on dietary variety and balanced nutrition    General Health Maintenance   Overall health well-managed with good control of diabetes and gastroparesis. Slightly elevated cholesterol levels balanced by high HDL. No immediate need for additional lab work.   - Continue current health maintenance plan   - Schedule follow-up in six months   - Encourage use of MyChart for scheduling and communication    Follow-up   - Schedule follow-up in six months          The patient was provided an opportunity to ask questions and all were answered. The patient agreed with the plan and demonstrated an understanding of the instructions.  Iona Beard , MD    CC: Tisovec, Adelfa Koh, MD

## 2023-12-20 DIAGNOSIS — F419 Anxiety disorder, unspecified: Secondary | ICD-10-CM | POA: Diagnosis not present

## 2023-12-20 DIAGNOSIS — E1040 Type 1 diabetes mellitus with diabetic neuropathy, unspecified: Secondary | ICD-10-CM | POA: Diagnosis not present

## 2023-12-20 DIAGNOSIS — M5030 Other cervical disc degeneration, unspecified cervical region: Secondary | ICD-10-CM | POA: Diagnosis not present

## 2023-12-20 DIAGNOSIS — J45991 Cough variant asthma: Secondary | ICD-10-CM | POA: Diagnosis not present

## 2023-12-20 DIAGNOSIS — Z4681 Encounter for fitting and adjustment of insulin pump: Secondary | ICD-10-CM | POA: Diagnosis not present

## 2023-12-20 DIAGNOSIS — L209 Atopic dermatitis, unspecified: Secondary | ICD-10-CM | POA: Diagnosis not present

## 2023-12-20 DIAGNOSIS — G43711 Chronic migraine without aura, intractable, with status migrainosus: Secondary | ICD-10-CM | POA: Diagnosis not present

## 2023-12-20 DIAGNOSIS — G629 Polyneuropathy, unspecified: Secondary | ICD-10-CM | POA: Diagnosis not present

## 2023-12-20 DIAGNOSIS — I10 Essential (primary) hypertension: Secondary | ICD-10-CM | POA: Diagnosis not present

## 2023-12-20 DIAGNOSIS — E1043 Type 1 diabetes mellitus with diabetic autonomic (poly)neuropathy: Secondary | ICD-10-CM | POA: Diagnosis not present

## 2023-12-20 DIAGNOSIS — K3184 Gastroparesis: Secondary | ICD-10-CM | POA: Diagnosis not present

## 2023-12-20 DIAGNOSIS — I7300 Raynaud's syndrome without gangrene: Secondary | ICD-10-CM | POA: Diagnosis not present
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
## 2024-01-10 ENCOUNTER — Ambulatory Visit (INDEPENDENT_AMBULATORY_CARE_PROVIDER_SITE_OTHER): Payer: Medicare Other | Admitting: Family Medicine

## 2024-01-10 DIAGNOSIS — G43709 Chronic migraine without aura, not intractable, without status migrainosus: Secondary | ICD-10-CM | POA: Diagnosis not present

## 2024-01-10 MED ORDER — ONABOTULINUMTOXINA 200 UNITS IJ SOLR
155.0000 [IU] | Freq: Once | INTRAMUSCULAR | Status: AC
Start: 2024-01-10 — End: 2024-01-10
  Administered 2024-01-10: 165 [IU] via INTRAMUSCULAR

## 2024-01-10 NOTE — Progress Notes (Signed)
 Botox- 200 units x 1 vial Lot: [id number] Expiration: 01/2026 NDC: 4540-9811-91  Bacteriostatic 0.9% Sodium Chloride- * mL  YNW:[id number] Expiration: 08/03/2024 HYQ:6578-4696-29  Dx: G43.709 B/B Witnessed by Raynald Kemp ,CMA

## 2024-01-12 ENCOUNTER — Other Ambulatory Visit: Payer: Self-pay | Admitting: *Deleted

## 2024-01-12 ENCOUNTER — Encounter: Payer: Self-pay | Admitting: Gastroenterology

## 2024-01-12 MED ORDER — PANTOPRAZOLE SODIUM 40 MG PO TBEC: 40.0000 mg | DELAYED_RELEASE_TABLET | Freq: Two times a day (BID) | ORAL | 2 refills | Status: DC

## 2024-01-23 ENCOUNTER — Encounter: Payer: Self-pay | Admitting: Gastroenterology

## 2024-02-06 ENCOUNTER — Other Ambulatory Visit: Payer: Self-pay | Admitting: Gastroenterology

## 2024-03-05 DIAGNOSIS — M810 Age-related osteoporosis without current pathological fracture: Secondary | ICD-10-CM | POA: Diagnosis not present

## 2024-03-12 ENCOUNTER — Telehealth: Payer: Self-pay | Admitting: Nurse Practitioner

## 2024-03-12 DIAGNOSIS — M5030 Other cervical disc degeneration, unspecified cervical region: Secondary | ICD-10-CM | POA: Diagnosis not present

## 2024-03-12 DIAGNOSIS — J45991 Cough variant asthma: Secondary | ICD-10-CM | POA: Diagnosis not present

## 2024-03-12 DIAGNOSIS — L209 Atopic dermatitis, unspecified: Secondary | ICD-10-CM | POA: Diagnosis not present

## 2024-03-12 DIAGNOSIS — E1040 Type 1 diabetes mellitus with diabetic neuropathy, unspecified: Secondary | ICD-10-CM | POA: Diagnosis not present

## 2024-03-12 DIAGNOSIS — G43711 Chronic migraine without aura, intractable, with status migrainosus: Secondary | ICD-10-CM | POA: Diagnosis not present

## 2024-03-12 DIAGNOSIS — I10 Essential (primary) hypertension: Secondary | ICD-10-CM | POA: Diagnosis not present

## 2024-03-12 DIAGNOSIS — K3184 Gastroparesis: Secondary | ICD-10-CM | POA: Diagnosis not present

## 2024-03-12 DIAGNOSIS — M810 Age-related osteoporosis without current pathological fracture: Secondary | ICD-10-CM | POA: Diagnosis not present

## 2024-03-12 DIAGNOSIS — E1043 Type 1 diabetes mellitus with diabetic autonomic (poly)neuropathy: Secondary | ICD-10-CM | POA: Diagnosis not present

## 2024-03-12 DIAGNOSIS — G629 Polyneuropathy, unspecified: Secondary | ICD-10-CM | POA: Diagnosis not present

## 2024-03-12 DIAGNOSIS — Z4681 Encounter for fitting and adjustment of insulin pump: Secondary | ICD-10-CM | POA: Diagnosis not present

## 2024-03-12 DIAGNOSIS — I7300 Raynaud's syndrome without gangrene: Secondary | ICD-10-CM | POA: Diagnosis not present

## 2024-03-12 LAB — COMPREHENSIVE METABOLIC PANEL WITH GFR: EGFR (Non-African Amer.): 61.7

## 2024-03-12 LAB — HEMOGLOBIN A1C: A1c: 6.1

## 2024-03-12 MED ORDER — LINACLOTIDE 145 MCG PO CAPS: ORAL_CAPSULE | ORAL | 3 refills | Status: AC

## 2024-03-12 MED ORDER — DICYCLOMINE HCL 10 MG PO CAPS: 10.0000 mg | ORAL_CAPSULE | Freq: Three times a day (TID) | ORAL | 11 refills | Status: AC

## 2024-03-12 NOTE — Telephone Encounter (Signed)
 Medications sent to pharmacy mail order as requested, patient must keep scheduled appointment

## 2024-03-12 NOTE — Telephone Encounter (Signed)
 Patient called and stated that she needed to make a follow up appointment with Dr. Leonia Raman. Patient was scheduled for August the 21 st. Patient also stated that she is needing a refill on her medication Linzess  of 145 MCG capsules 90 Day supply and would like it to go to [facility]. [facility] number is [phone] and fax number is [phone]. Please advise.

## 2024-03-12 NOTE — Telephone Encounter (Signed)
 Inbound call from patient, would also like dicyclomine  90 day supply sent in.

## 2024-03-15 ENCOUNTER — Telehealth: Payer: Self-pay | Admitting: Pharmacy Technician

## 2024-03-15 NOTE — Telephone Encounter (Signed)
 Auth Submission: NO AUTH NEEDED Site of care: Site of care: [facility] Payer: MEDICARE A/B & SUPP Medication & CPT/J Code(s) submitted: Prolia (Denosumab) [id number] Diagnosis Code:  Route of submission (phone, fax, portal):  Phone # Fax # Auth type: Buy/Bill HB Units/visits requested: 60MG  Q6M X2 DOSES Reference number:  Approval from: 03/15/24 to 11/03/23
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
## 2024-04-10 ENCOUNTER — Encounter: Payer: Self-pay | Admitting: Family Medicine

## 2024-04-10 ENCOUNTER — Other Ambulatory Visit (HOSPITAL_COMMUNITY): Payer: Self-pay | Admitting: *Deleted

## 2024-04-10 ENCOUNTER — Ambulatory Visit (INDEPENDENT_AMBULATORY_CARE_PROVIDER_SITE_OTHER): Admitting: Family Medicine

## 2024-04-10 VITALS — BP 122/76 | HR 65

## 2024-04-10 DIAGNOSIS — G43709 Chronic migraine without aura, not intractable, without status migrainosus: Secondary | ICD-10-CM

## 2024-04-10 MED ORDER — ONABOTULINUMTOXINA 200 UNITS IJ SOLR
155.0000 [IU] | Freq: Once | INTRAMUSCULAR | Status: AC
  Administered 2024-04-10: 155 [IU] via INTRAMUSCULAR

## 2024-04-10 NOTE — Progress Notes (Signed)
 Botox - 200 units x 1 vial Lot: [id number] Expiration: 07/2026 NDC: 9976-6078-97  Bacteriostatic 0.9% Sodium Chloride - 30 mL  Lot: [id number] Expiration: oct-31-2026 NDC:0409-1966-02  Dx:G43.709 B/B Witnessed ab:Xyjipgjy Izell, CMA

## 2024-04-12 ENCOUNTER — Ambulatory Visit (HOSPITAL_COMMUNITY)
Admission: RE | Admit: 2024-04-12 | Discharge: 2024-04-12 | Disposition: A | Discharge status: Home / Self Care | Source: Ambulatory Visit | Attending: Internal Medicine

## 2024-04-12 DIAGNOSIS — M810 Age-related osteoporosis without current pathological fracture: Secondary | ICD-10-CM | POA: Diagnosis not present

## 2024-04-12 MED ORDER — DENOSUMAB 60 MG/ML ~~LOC~~ SOSY
PREFILLED_SYRINGE | SUBCUTANEOUS | Status: AC
  Filled 2024-04-12: qty 1

## 2024-04-12 MED ORDER — DENOSUMAB 60 MG/ML ~~LOC~~ SOSY
60.0000 mg | PREFILLED_SYRINGE | Freq: Once | SUBCUTANEOUS | Status: AC
  Administered 2024-04-12: 60 mg via SUBCUTANEOUS

## 2024-05-23 ENCOUNTER — Ambulatory Visit (INDEPENDENT_AMBULATORY_CARE_PROVIDER_SITE_OTHER): Admitting: Gastroenterology

## 2024-05-23 ENCOUNTER — Encounter: Payer: Self-pay | Admitting: Gastroenterology

## 2024-05-23 VITALS — BP 108/66 | HR 65 | Ht 62.0 in | Wt 107.0 lb

## 2024-05-23 DIAGNOSIS — K5909 Other constipation: Secondary | ICD-10-CM

## 2024-05-23 DIAGNOSIS — K581 Irritable bowel syndrome with constipation: Secondary | ICD-10-CM

## 2024-05-23 DIAGNOSIS — K219 Gastro-esophageal reflux disease without esophagitis: Secondary | ICD-10-CM | POA: Diagnosis not present

## 2024-05-23 DIAGNOSIS — K3184 Gastroparesis: Secondary | ICD-10-CM | POA: Diagnosis not present

## 2024-05-23 DIAGNOSIS — K2100 Gastro-esophageal reflux disease with esophagitis, without bleeding: Secondary | ICD-10-CM

## 2024-05-23 DIAGNOSIS — E1043 Type 1 diabetes mellitus with diabetic autonomic (poly)neuropathy: Secondary | ICD-10-CM

## 2024-05-23 DIAGNOSIS — R109 Unspecified abdominal pain: Secondary | ICD-10-CM

## 2024-05-23 DIAGNOSIS — E1143 Type 2 diabetes mellitus with diabetic autonomic (poly)neuropathy: Secondary | ICD-10-CM

## 2024-05-23 DIAGNOSIS — R5383 Other fatigue: Secondary | ICD-10-CM | POA: Diagnosis not present

## 2024-05-23 NOTE — Progress Notes (Signed)
 Taylor Adams [facility]    [id number]    28-Jun-1951  Primary Care Physician:Tisovec, Charlie ORN, MD  Referring Physician: Tisovec, Richard W, MD [address] [place],  [place] [address]   Chief complaint:  Gastroparesis  Discussed the use of AI scribe software for clinical note transcription with the patient, who gave verbal consent to proceed.  History of Present Illness Taylor Adams is a 73 year old female with diabetes and gastroparesis who presents for a gastroenterology follow-up.  Glycemic control and diabetic neuropathy - Diabetes with generally well-controlled blood glucose: post-breakfast readings around 149 mg/dL, fasting levels around 110 mg/dL - Difficulty with basal insulin  rates, experiencing afternoon hyperglycemic spikes - Target blood glucose range is 100-200 mg/dL to avoid rapid hypoglycemic episodes causing discomfort - Neuropathy with unpredictable throbbing pain in the side, legs, or feet  Gastroparesis and gastrointestinal symptoms - Gastroparesis with nausea approximately once per month, improved from nearly daily symptoms previously - Nausea managed with Dramamine, which is more effective than Zofran  - Regular bowel movements, three to four times daily - No hematochezia or melena - Linzess  taken every morning, resulting in reduced bloating and improved bowel movements - Persistent stomach cramps, alleviated with abdominal massage  Esophageal dysmotility and dysphagia - Sensation of esophageal inflammation and slow transit of food - Takes medication for esophageal symptoms twice daily, with occasional use of an additional medication as needed - Last upper endoscopy performed three years ago  Nutritional status and fatigue - Recent Prolia  injection - Low energy levels despite daily consumption of Premier Protein - Uncertain B12 status, but believes vitamin D levels are adequate - Difficulty maintaining adequate nutritional intake due to limited  food consumption    GI Hx: EGD July 2022: Mild duodenitis otherwise unremarkable   Small bowel follow-through was negative for bowel obstruction.   She was treated with Xifaxan  in 2017 for small intestinal bacterial overgrowth and she has been relatively symptom-free for past few years until recent flare up.       High-grade SBO in January 2015 , she underwent exploratory laparotomy with lysis of adhesions and ileocectomy.   In 2002 she was diagnosed with bacterial overgrowth on a breath Hydrogen test.   She has history of gastroparesis. She was on Reglan  but it was discontinued in 2015 given no significant improvement  Patient had a colonoscopy in 2002 with removal of a tubular adenoma. A repeat colonoscopy in 2007 was normal. Recent colonoscopy in July 2017 was unremarkable as well except for sigmoid diverticulosis .    Other past GI workup include  small bowel follow-through in 2005 was normal. An upper abdominal ultrasound in 2005 was negative. Her celiac profile was normal in 2005      Outpatient Encounter Medications as of 05/23/2024  Medication Sig   ALPRAZolam  (XANAX ) 0.5 MG tablet Take 1 tablet (0.5 mg total) by mouth 2 (two) times daily as needed for anxiety.   B-D ULTRA-FINE 33 LANCETS MISC    BAYER CONTOUR NEXT TEST test strip    Biotin 5000 MCG TABS Take 5,000 mcg by mouth daily.    bisacodyl 5 MG EC tablet Take 5 mg by mouth at bedtime.   budesonide -formoterol  (SYMBICORT ) 80-4.5 MCG/ACT inhaler Inhale 2 puffs into the lungs 2 (two) times daily.   Cholecalciferol (VITAMIN D3) 1000 units CAPS Take 1,000 Units by mouth 2 (two) times daily.    Continuous Glucose Sensor (DEXCOM G7 SENSOR) MISC by Does not  apply route.   cyclobenzaprine (FLEXERIL) 10 MG tablet Take 10 mg by mouth as needed.   denosumab  (PROLIA ) 60 MG/ML SOSY injection Inject 60 mg into the skin every 6 (six) months.   dicyclomine  (BENTYL ) 10 MG capsule Take 1 capsule (10 mg total) by mouth 4 (four) times daily -   before meals and at bedtime. As needed for abdominal spasms   GLUCAGEN HYPOKIT 1 MG SOLR injection    hydrOXYzine (ATARAX/VISTARIL) 25 MG tablet Take 25 mg by mouth 3 (three) times daily as needed.   Insulin  Human (INSULIN  PUMP) 100 unit/ml SOLN Inject into the skin continuous. Novolog 100 unit/mL Basal Rate: 12 am to 4:30 am  0.375 4:30 am to 8 am   0.45 8 am to 12 pm    0.525 12 pm to 3 pm  0.45 3 pm to 9 pm 0.475 9 pm to 12 am 0.425   linaclotide  (LINZESS ) 145 MCG CAPS capsule TAKE ONE CAPSULE BY MOUTH DAILY BEFORE BREAKFAST   losartan (COZAAR) 50 MG tablet Take 50 mg by mouth daily.   metoCLOPramide  (REGLAN ) 5 MG tablet TAKE 1 TABLET BY MOUTH IN THE MORNING AND AT BEDTIME   Multiple Vitamin (MULTIVITAMIN) tablet Take 1 tablet by mouth daily. Women's 50+   oxyCODONE -acetaminophen  (PERCOCET) 10-325 MG tablet Take 1 tablet by mouth every 6 (six) hours as needed for pain.   pantoprazole  (PROTONIX ) 40 MG tablet Take 1 tablet (40 mg total) by mouth 2 (two) times daily.   Probiotic Product (VSL#3) CAPS Take 1 capsule by mouth daily.   Rimegepant Sulfate (NURTEC) 75 MG TBDP Take 75 mg by mouth daily as needed. For migraines. Take as close to onset of migraine as possible. One daily maximum.   traMADol  (ULTRAM ) 50 MG tablet Take 50 mg by mouth 2 (two) times daily as needed.   [DISCONTINUED] Continuous Blood Gluc Sensor (DEXCOM G6 SENSOR) MISC USE AS DIRECTED & CHANGE SENSOR EVERY 30 DAYS   [DISCONTINUED] naloxegol  oxalate (MOVANTIK ) 25 MG TABS tablet Take 1/2 tablet by mouth 12.5mg  for 4 weeks then increase to 1 tablet 25 mg once daily   No facility-administered encounter medications on file as of 05/23/2024.    Allergies as of 05/23/2024 - Review Complete 05/23/2024  Allergen Reaction Noted   Pregabalin Other (See Comments) 11/15/2022   Valacyclovir Hives 05/17/2023   Codeine Itching 04/15/2016   Latex Rash 04/15/2016    Past Medical History:  Diagnosis Date   Anxiety    Arthritis     Benign colon polyp    Cataracts, bilateral    Depression    Diabetes mellitus    Diarrhea    Gastroparesis    Headache    migraines   Intestinal bacterial overgrowth    Osteoporosis 2021   PONV (postoperative nausea and vomiting)    history of n/v   Salivary gland disorder    clogged salivary glands   SBO (small bowel obstruction) (HCC)     Past Surgical History:  Procedure Laterality Date   ABDOMINAL HYSTERECTOMY     APPENDECTOMY     BREAST EXCISIONAL BIOPSY Bilateral    benign   BREAST SURGERY     biopsy   CATARACT EXTRACTION Bilateral 2017   Dr. Lavonia   CATARACT EXTRACTION, BILATERAL     EYE SURGERY     ILEOCECETOMY  10/07/2013   Procedure: MAUDRY;  Surgeon: Lynda Leos, MD;  Location: [facility] OR;  Service: General;;   LAPAROTOMY N/A 10/07/2013   Procedure: EXPLORATORY LAPAROTOMY;  Surgeon: Lynda Leos, MD;  Location: [facility] OR;  Service: General;  Laterality: N/A;   LYSIS OF ADHESION  10/07/2013   Procedure: LYSIS OF ADHESION;  Surgeon: Lynda Leos, MD;  Location: [facility] OR;  Service: General;;   NASAL SEPTOPLASTY W/ TURBINOPLASTY Bilateral 12/28/2016   Procedure: NASAL SEPTOPLASTY WITH  BILATARAL TURBINATE REDUCTION;  Surgeon: Daniel Moccasin, MD;  Location: [facility] OR;  Service: ENT;  Laterality: Bilateral;   resection of lymph node in neck     ROTATOR CUFF REPAIR     right side   SALIVARY GLAND SURGERY  11/25/2020   SINUS ENDO WITH FUSION Bilateral 12/28/2016   Procedure: ENDOSCOPIC TOTAL ETHMOIDECTOMY,ENDOSCOPIC MAXILLARY ANTROSTOMY ;  Surgeon: Daniel Moccasin, MD;  Location: [facility] OR;  Service: ENT;  Laterality: Bilateral;   YAG LASER APPLICATION Bilateral 2018    Family History  Problem Relation Age of Onset   Diabetes Mother    Heart disease Mother    Colon polyps Father    Heart disease Father    Diabetes Father    Allergies Sister    Heart disease Brother    Allergies Brother    Colon cancer Neg Hx    Rectal cancer Neg Hx    Stomach cancer Neg Hx    Esophageal cancer  Neg Hx    Pancreatic cancer Neg Hx     Social History   Socioeconomic History   Marital status: Married    Spouse name: Not on file   Number of children: Not on file   Years of education: Not on file   Highest education level: Not on file  Occupational History   Not on file  Tobacco Use   Smoking status: Never   Smokeless tobacco: Never  Vaping Use   Vaping status: Never Used  Substance and Sexual Activity   Alcohol  use: Yes    Comment: glass of wine every now and then   Drug use: No   Sexual activity: Not on file  Other Topics Concern   Not on file  Social History Narrative   Lives at home with her husband   Right handed   Caffeine: 2 cups per day   Social Drivers of Corporate investment banker Strain: Not on file  Food Insecurity: Not on file  Transportation Needs: Not on file  Physical Activity: Not on file  Stress: Not on file  Social Connections: Unknown (02/11/2022)   Received from [facility]   Social Network    Social Network: Not on file  Intimate Partner Violence: Unknown (01/04/2022)   Received from [facility]   HITS    Physically Hurt: Not on file    Insult or Talk Down To: Not on file    Threaten Physical Harm: Not on file    Scream or Curse: Not on file      Review of systems: All other review of systems negative except as mentioned in the HPI.   Physical Exam: Vitals:   05/23/24 0950  BP: 108/66  Pulse: 65   Body mass index is 19.57 kg/m. Gen:      No acute distress HEENT:  sclera anicteric Ext:    No edema Neuro: alert and oriented x 3 Psych: normal mood and affect  Data Reviewed:  Reviewed labs, radiology imaging, old records and pertinent past GI work up    Assessment and Plan Assessment & Plan Type 1 diabetes mellitus complicated by diabetic neuropathy and gastroparesis Blood sugar levels are generally well-controlled, with postprandial levels  between 149 and 190 mg/dL and fasting levels around 110 mg/dL.  Neuropathy presents as throbbing pains in various locations, including the side, legs, and feet. Gastroparesis symptoms include occasional nausea and vomiting, now reduced to once a month, managed with Dramamine. Maintaining blood sugar in a range of 100 to 200 mg/dL is beneficial for gastroparesis management. - Continue current diabetes management to maintain blood sugar levels between 100 to 200 mg/dL. - Continue Dramamine as needed for nausea. - Schedule upper endoscopy to evaluate for esophagitis or other changes in the esophagus. The risks and benefits as well as alternatives of endoscopic procedure(s) have been discussed and reviewed. All questions answered. The patient agrees to proceed.   Chronic constipation with abdominal cramping Bowel movements occur three to four times daily with no blood or black stools. Linzess  is effective in managing constipation. Abdominal cramping persists despite regular bowel movements, but massaging provides relief. Peppermint oil is suggested as a natural remedy for cramping. - Continue Linzess  145 mcg every morning. - Try peppermint oil capsules or diluted peppermint oil in warm water for abdominal cramping. Provide samples for trial.  Gastroesophageal reflux disease with possible esophagitis Symptoms of reflux and esophageal discomfort suggest possible esophagitis. Current medication regimen includes a morning and evening dose, with an additional medication taken sparingly. An upper endoscopy is warranted to assess for esophagitis or other esophageal changes. If esophagitis is present, medication adjustments will be considered. - Schedule upper endoscopy for October 7th at 8:30 AM to evaluate for esophagitis or other esophageal changes. - Adjust medications based on endoscopy findings if necessary.  Fatigue possibly related to nutritional deficiency Fatigue is persistent despite adequate vitamin D levels. B12 levels are unknown but were likely checked in  December. Nutritional intake is limited due to gastroparesis, and energy levels are low despite consuming Premier Protein drinks. IV vitamin and mineral therapy is considered to address nutritional deficiencies. - Check B12 levels from previous labs or request from Dr. Sherol office. - Provide information on IV vitamin and mineral therapy services, specifically recommending a known provider, Josh Monday, in [place]. - Consider starting over-the-counter dissolvable B12 supplements.    This visit required >40 minutes of patient care (this includes precharting, chart review, review of results, face-to-face time used for counseling as well as treatment plan and follow-up. The patient was provided an opportunity to ask questions and all were answered. The patient agreed with the plan and demonstrated an understanding of the instructions.  Taylor Adams , MD    CC: Tisovec, Charlie ORN, MD

## 2024-05-23 NOTE — Patient Instructions (Addendum)
 You have been scheduled for an endoscopy. Please follow written instructions given to you at your visit today.  If you use inhalers (even only as needed), please bring them with you on the day of your procedure.  If you take any of the following medications, they will need to be adjusted prior to your procedure:   DO NOT TAKE 7 DAYS PRIOR TO TEST- Trulicity (dulaglutide) Ozempic, Wegovy (semaglutide) Mounjaro (tirzepatide) Bydureon Bcise (exanatide extended release)  DO NOT TAKE 1 DAY PRIOR TO YOUR TEST Rybelsus (semaglutide) Adlyxin (lixisenatide) Victoza (liraglutide) Byetta (exanatide)   VISIT SUMMARY:  Today, we discussed your diabetes management, gastroparesis, chronic constipation, gastroesophageal reflux disease, and fatigue. We reviewed your current symptoms and made some adjustments to your treatment plan to help manage your conditions more effectively.  YOUR PLAN:  TYPE 1 DIABETES MELLITUS WITH DIABETIC NEUROPATHY AND GASTROPARESIS: Your blood sugar levels are generally well-controlled, but you experience some neuropathy pain and occasional nausea due to gastroparesis. -Continue your current diabetes management to keep your blood sugar levels between 100 to 200 mg/dL. -Use Dramamine as needed for nausea. -We will schedule an upper endoscopy to check for any changes in your esophagus.  CHRONIC CONSTIPATION WITH ABDOMINAL CRAMPING: You have regular bowel movements but still experience abdominal cramping. -Continue taking Linzess  every morning. -Try peppermint oil capsules or diluted peppermint oil in warm water for abdominal cramping. We will provide samples for you to try.  GASTROESOPHAGEAL REFLUX DISEASE WITH POSSIBLE ESOPHAGITIS: You have symptoms of reflux and esophageal discomfort, which may indicate esophagitis. -We will schedule an upper endoscopy for October 7th at 8:30 AM to check for esophagitis or other changes in your esophagus. -We may adjust your medications  based on the endoscopy findings.  FATIGUE POSSIBLY RELATED TO NUTRITIONAL DEFICIENCY: You are experiencing fatigue despite adequate vitamin D levels, and your B12 levels are unknown. -We will check your B12 levels from previous labs or request them from Dr. Eura office. -Consider starting over-the-counter dissolvable B12 supplements.            _ X _   INSULIN  PUMP MEDICATION INSTRUCTIONS We will contact the physician managing your diabetic care for written dosage instructions for the day before your procedure and the day of your procedure.  Once we have received the instructions, we will contact you.   Due to recent changes in healthcare laws, you may see the results of your imaging and laboratory studies on MyChart before your provider has had a chance to review them.  We understand that in some cases there may be results that are confusing or concerning to you. Not all laboratory results come back in the same time frame and the provider may be waiting for multiple results in order to interpret others.  Please give us  48 hours in order for your provider to thoroughly review all the results before contacting the office for clarification of your results.    I appreciate the  opportunity to care for you  Thank You   Kavitha Nandigam , MD

## 2024-06-19 DIAGNOSIS — K219 Gastro-esophageal reflux disease without esophagitis: Secondary | ICD-10-CM | POA: Diagnosis not present

## 2024-06-19 DIAGNOSIS — F419 Anxiety disorder, unspecified: Secondary | ICD-10-CM | POA: Diagnosis not present

## 2024-06-19 DIAGNOSIS — Z4681 Encounter for fitting and adjustment of insulin pump: Secondary | ICD-10-CM | POA: Diagnosis not present

## 2024-06-19 DIAGNOSIS — K3184 Gastroparesis: Secondary | ICD-10-CM | POA: Diagnosis not present

## 2024-06-19 DIAGNOSIS — J45991 Cough variant asthma: Secondary | ICD-10-CM | POA: Diagnosis not present

## 2024-06-19 DIAGNOSIS — Z23 Encounter for immunization: Secondary | ICD-10-CM | POA: Diagnosis not present

## 2024-06-19 DIAGNOSIS — G629 Polyneuropathy, unspecified: Secondary | ICD-10-CM | POA: Diagnosis not present

## 2024-06-19 DIAGNOSIS — I10 Essential (primary) hypertension: Secondary | ICD-10-CM | POA: Diagnosis not present

## 2024-06-19 DIAGNOSIS — M5030 Other cervical disc degeneration, unspecified cervical region: Secondary | ICD-10-CM | POA: Diagnosis not present

## 2024-06-19 DIAGNOSIS — E1040 Type 1 diabetes mellitus with diabetic neuropathy, unspecified: Secondary | ICD-10-CM | POA: Diagnosis not present

## 2024-06-19 DIAGNOSIS — M810 Age-related osteoporosis without current pathological fracture: Secondary | ICD-10-CM | POA: Diagnosis not present

## 2024-06-19 DIAGNOSIS — E103293 Type 1 diabetes mellitus with mild nonproliferative diabetic retinopathy without macular edema, bilateral: Secondary | ICD-10-CM | POA: Diagnosis not present
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
## 2024-07-03 ENCOUNTER — Ambulatory Visit (INDEPENDENT_AMBULATORY_CARE_PROVIDER_SITE_OTHER): Admitting: Family Medicine

## 2024-07-03 ENCOUNTER — Encounter: Payer: Self-pay | Admitting: Family Medicine

## 2024-07-03 VITALS — BP 112/62 | HR 65

## 2024-07-03 DIAGNOSIS — G43709 Chronic migraine without aura, not intractable, without status migrainosus: Secondary | ICD-10-CM

## 2024-07-03 MED ORDER — ONABOTULINUMTOXINA 200 UNITS IJ SOLR
155.0000 [IU] | Freq: Once | INTRAMUSCULAR | Status: AC
  Administered 2024-07-03: 165 [IU] via INTRAMUSCULAR

## 2024-07-03 NOTE — Progress Notes (Signed)
 Botox - 200 units x 1 vial Lot: [id number] Expiration: 06/2025 NDC: 9976-6078-97  Bacteriostatic 0.9% Sodium Chloride - 30 mL  Lot: [id number] Expiration: OCT-31-2026 NDC:0409-1966-02  Dx: G43.709 S/P OR B/B Witnessed by: Maurilio Molt, RN

## 2024-07-09 ENCOUNTER — Encounter: Payer: Self-pay | Admitting: Gastroenterology

## 2024-07-09 ENCOUNTER — Ambulatory Visit: Admitting: Gastroenterology

## 2024-07-09 VITALS — BP 97/58 | HR 71 | Temp 97.3°F | Resp 12 | Ht 62.0 in | Wt 107.0 lb

## 2024-07-09 DIAGNOSIS — E1143 Type 2 diabetes mellitus with diabetic autonomic (poly)neuropathy: Secondary | ICD-10-CM | POA: Diagnosis not present

## 2024-07-09 DIAGNOSIS — F419 Anxiety disorder, unspecified: Secondary | ICD-10-CM | POA: Diagnosis not present

## 2024-07-09 DIAGNOSIS — E119 Type 2 diabetes mellitus without complications: Secondary | ICD-10-CM | POA: Diagnosis not present

## 2024-07-09 DIAGNOSIS — F32A Depression, unspecified: Secondary | ICD-10-CM | POA: Diagnosis not present

## 2024-07-09 DIAGNOSIS — K219 Gastro-esophageal reflux disease without esophagitis: Secondary | ICD-10-CM

## 2024-07-09 DIAGNOSIS — K3184 Gastroparesis: Secondary | ICD-10-CM | POA: Diagnosis not present

## 2024-07-09 MED ORDER — SODIUM CHLORIDE 0.9 % IV SOLN: 500.0000 mL | Freq: Once | INTRAVENOUS | Status: DC

## 2024-07-09 NOTE — Progress Notes (Unsigned)
 Pt's states no medical or surgical changes since previsit or office visit.

## 2024-07-09 NOTE — Op Note (Signed)
 [facility] Patient Name: Taylor Adams Procedure Date: 07/09/2024 9:35 AM MRN: [id number] Endoscopist: Gustav ALONSO Mcgee , MD, [id number] Age: 73 Referring MD:  Date of Birth: 03/11/1951 Gender: Female Account #: [id number] Procedure:                Upper GI endoscopy Indications:              Epigastric abdominal pain, Esophageal reflux                            symptoms that persist despite appropriate therapy Medicines:                Monitored Anesthesia Care Procedure:                Pre-Anesthesia Assessment:                           - Prior to the procedure, a History and Physical                            was performed, and patient medications and                            allergies were reviewed. The patient's tolerance of                            previous anesthesia was also reviewed. The risks                            and benefits of the procedure and the sedation                            options and risks were discussed with the patient.                            All questions were answered, and informed consent                            was obtained. Prior Anticoagulants: The patient has                            taken no anticoagulant or antiplatelet agents. ASA                            Grade Assessment: II - A patient with mild systemic                            disease. After reviewing the risks and benefits,                            the patient was deemed in satisfactory condition to                            undergo the procedure.  After obtaining informed consent, the endoscope was                            passed under direct vision. Throughout the                            procedure, the patient's blood pressure, pulse, and                            oxygen saturations were monitored continuously. The                            Olympus Scope [id number] was introduced through the                             mouth, and advanced to the second part of duodenum.                            The upper GI endoscopy was accomplished without                            difficulty. The patient tolerated the procedure                            well. Scope In: Scope Out: Findings:                 The Z-line was regular and was found 38 cm from the                            incisors.                           No gross lesions were noted in the entire esophagus.                           The stomach was normal.                           The cardia and gastric fundus were normal on                            retroflexion.                           The examined duodenum was normal. Complications:            No immediate complications. Estimated Blood Loss:     Estimated blood loss was minimal. Impression:               - Z-line regular, 38 cm from the incisors.                           - No gross lesions in the entire esophagus.                           -  Normal stomach.                           - Normal examined duodenum.                           - No specimens collected. Recommendation:           - Resume previous diet.                           - Continue present medications.                           - Follow an antireflux regimen.                           - Return to GI office in 3 months, Dr.Sherral Dirocco or                            APP. [name], MD 07/09/2024 9:55:10 AM This report has been signed electronically.

## 2024-07-09 NOTE — Progress Notes (Unsigned)
 [facility] History and Physical   Primary Care Physician:  Tisovec, Charlie ORN, MD   Reason for Procedure:  Persistent GERD despite treatment  Plan:    EGD  with possible interventions as needed     HPI: Taylor Adams is a very pleasant 73 y.o. female here for EGD for persistent GERD symptoms despite treatment.  Please refer to office visit for additional details. The risks and benefits as well as alternatives of endoscopic procedure(s) have been discussed and reviewed. All questions answered. The patient agrees to proceed.    Past Medical History:  Diagnosis Date   Anxiety    Arthritis    Benign colon polyp    Cataracts, bilateral    Depression    Diabetes mellitus    Diarrhea    Gastroparesis    Headache    migraines   Intestinal bacterial overgrowth    Osteoporosis 2021   PONV (postoperative nausea and vomiting)    history of n/v   Salivary gland disorder    clogged salivary glands   SBO (small bowel obstruction) (HCC)     Past Surgical History:  Procedure Laterality Date   ABDOMINAL HYSTERECTOMY     APPENDECTOMY     BREAST EXCISIONAL BIOPSY Bilateral    benign   BREAST SURGERY     biopsy   CATARACT EXTRACTION Bilateral 2017   Dr. Lavonia   CATARACT EXTRACTION, BILATERAL     EYE SURGERY     ILEOCECETOMY  10/07/2013   Procedure: MAUDRY;  Surgeon: Lynda Leos, MD;  Location: [facility] OR;  Service: General;;   LAPAROTOMY N/A 10/07/2013   Procedure: EXPLORATORY LAPAROTOMY;  Surgeon: Lynda Leos, MD;  Location: [facility] OR;  Service: General;  Laterality: N/A;   LYSIS OF ADHESION  10/07/2013   Procedure: LYSIS OF ADHESION;  Surgeon: Lynda Leos, MD;  Location: [facility] OR;  Service: General;;   NASAL SEPTOPLASTY W/ TURBINOPLASTY Bilateral 12/28/2016   Procedure: NASAL SEPTOPLASTY WITH  BILATARAL TURBINATE REDUCTION;  Surgeon: Daniel Moccasin, MD;  Location: [facility] OR;  Service: ENT;  Laterality: Bilateral;   resection of lymph node in neck     ROTATOR CUFF REPAIR      right side   SALIVARY GLAND SURGERY  11/25/2020   SINUS ENDO WITH FUSION Bilateral 12/28/2016   Procedure: ENDOSCOPIC TOTAL ETHMOIDECTOMY,ENDOSCOPIC MAXILLARY ANTROSTOMY ;  Surgeon: Daniel Moccasin, MD;  Location: [facility] OR;  Service: ENT;  Laterality: Bilateral;   YAG LASER APPLICATION Bilateral 2018    Prior to Admission medications   Medication Sig Start Date End Date Taking? Authorizing Provider  ALPRAZolam  (XANAX ) 0.5 MG tablet Take 1 tablet (0.5 mg total) by mouth 2 (two) times daily as needed for anxiety. 04/16/19  Yes Dohmeier, Dedra, MD  Biotin 5000 MCG TABS Take 5,000 mcg by mouth daily.    Yes [provider]  bisacodyl 5 MG EC tablet Take 5 mg by mouth at bedtime.   Yes [provider]  Cholecalciferol (VITAMIN D3) 1000 units CAPS Take 1,000 Units by mouth 2 (two) times daily.    Yes [provider]  Continuous Glucose Sensor (DEXCOM G7 SENSOR) MISC by Does not apply route.   Yes [provider]  denosumab  (PROLIA ) 60 MG/ML SOSY injection Inject 60 mg into the skin every 6 (six) months.   Yes [provider]  dicyclomine  (BENTYL ) 10 MG capsule Take 1 capsule (10 mg total) by mouth 4 (four) times daily -  before meals and at bedtime. As needed for abdominal  spasms 03/12/24  Yes James Senn V, MD  Insulin  Human (INSULIN  PUMP) 100 unit/ml SOLN Inject into the skin continuous. Novolog 100 unit/mL Basal Rate: 12 am to 4:30 am  0.375 4:30 am to 8 am   0.45 8 am to 12 pm    0.525 12 pm to 3 pm  0.45 3 pm to 9 pm 0.475 9 pm to 12 am 0.425   Yes [provider]  linaclotide  (LINZESS ) 145 MCG CAPS capsule TAKE ONE CAPSULE BY MOUTH DAILY BEFORE BREAKFAST 03/12/24  Yes Ariaunna Longsworth V, MD  losartan (COZAAR) 50 MG tablet Take 50 mg by mouth daily. 02/14/19  Yes [provider]  metoCLOPramide  (REGLAN ) 5 MG tablet TAKE 1 TABLET BY MOUTH IN THE MORNING AND AT BEDTIME 02/07/24  Yes Christ Fullenwider V, MD  Multiple Vitamin  (MULTIVITAMIN) tablet Take 1 tablet by mouth daily. Women's 50+   Yes [provider]  pantoprazole  (PROTONIX ) 40 MG tablet Take 1 tablet (40 mg total) by mouth 2 (two) times daily. 01/12/24  Yes Gay Moncivais V, MD  Probiotic Product (VSL#3) CAPS Take 1 capsule by mouth daily. 05/25/16  Yes Oakley Kossman V, MD  traMADol  (ULTRAM ) 50 MG tablet Take 50 mg by mouth 2 (two) times daily as needed.   Yes [provider]  B-D ULTRA-FINE 33 LANCETS MISC  11/18/13   [provider]  BAYER CONTOUR NEXT TEST test strip  11/28/13   [provider]  budesonide -formoterol  (SYMBICORT ) 80-4.5 MCG/ACT inhaler Inhale 2 puffs into the lungs 2 (two) times daily. 02/01/18   Darlean Ozell NOVAK, MD  Cholecalciferol (VITAMIN D3) 50 MCG (2000 UT) capsule     [provider]  cyclobenzaprine (FLEXERIL) 10 MG tablet Take 10 mg by mouth as needed.    [provider]  GLUCAGEN HYPOKIT 1 MG SOLR injection  04/11/16   [provider]  hydrOXYzine (ATARAX/VISTARIL) 25 MG tablet Take 25 mg by mouth 3 (three) times daily as needed.    [provider]  NOVOLOG 100 UNIT/ML injection as directed.    [provider]  oxyCODONE -acetaminophen  (PERCOCET) 10-325 MG tablet Take 1 tablet by mouth every 6 (six) hours as needed for pain. 10/17/23   Lomax, Amy, NP  pregabalin (LYRICA) 75 MG capsule 1 tablet po bid for neuropathic pain Orally Twice a day; Duration: 90 days    [provider]  Rimegepant Sulfate (NURTEC) 75 MG TBDP Take 75 mg by mouth daily as needed. For migraines. Take as close to onset of migraine as possible. One daily maximum. 01/05/21   Ines Onetha NOVAK, MD    Current Outpatient Medications  Medication Sig Dispense Refill   ALPRAZolam  (XANAX ) 0.5 MG tablet Take 1 tablet (0.5 mg total) by mouth 2 (two) times daily as needed for anxiety. 30 tablet 0   Biotin 5000 MCG TABS Take 5,000 mcg by mouth daily.      bisacodyl 5 MG EC tablet Take 5  mg by mouth at bedtime.     Cholecalciferol (VITAMIN D3) 1000 units CAPS Take 1,000 Units by mouth 2 (two) times daily.      Continuous Glucose Sensor (DEXCOM G7 SENSOR) MISC by Does not apply route.     denosumab  (PROLIA ) 60 MG/ML SOSY injection Inject 60 mg into the skin every 6 (six) months.     dicyclomine  (BENTYL ) 10 MG capsule Take 1 capsule (10 mg total) by mouth 4 (four) times daily -  before meals and at bedtime. As needed  for abdominal spasms 120 capsule 11   Insulin  Human (INSULIN  PUMP) 100 unit/ml SOLN Inject into the skin continuous. Novolog 100 unit/mL Basal Rate: 12 am to 4:30 am  0.375 4:30 am to 8 am   0.45 8 am to 12 pm    0.525 12 pm to 3 pm  0.45 3 pm to 9 pm 0.475 9 pm to 12 am 0.425     linaclotide  (LINZESS ) 145 MCG CAPS capsule TAKE ONE CAPSULE BY MOUTH DAILY BEFORE BREAKFAST 90 capsule 3   losartan (COZAAR) 50 MG tablet Take 50 mg by mouth daily.     metoCLOPramide  (REGLAN ) 5 MG tablet TAKE 1 TABLET BY MOUTH IN THE MORNING AND AT BEDTIME 60 tablet 2   Multiple Vitamin (MULTIVITAMIN) tablet Take 1 tablet by mouth daily. Women's 50+     pantoprazole  (PROTONIX ) 40 MG tablet Take 1 tablet (40 mg total) by mouth 2 (two) times daily. 180 tablet 2   Probiotic Product (VSL#3) CAPS Take 1 capsule by mouth daily. 30 capsule 11   traMADol  (ULTRAM ) 50 MG tablet Take 50 mg by mouth 2 (two) times daily as needed.     B-D ULTRA-FINE 33 LANCETS MISC      BAYER CONTOUR NEXT TEST test strip      budesonide -formoterol  (SYMBICORT ) 80-4.5 MCG/ACT inhaler Inhale 2 puffs into the lungs 2 (two) times daily. 1 Inhaler 11   Cholecalciferol (VITAMIN D3) 50 MCG (2000 UT) capsule      cyclobenzaprine (FLEXERIL) 10 MG tablet Take 10 mg by mouth as needed.     GLUCAGEN HYPOKIT 1 MG SOLR injection  (Patient not taking: Reported on 07/09/2024)  6   hydrOXYzine (ATARAX/VISTARIL) 25 MG tablet Take 25 mg by mouth 3 (three) times daily as needed.     NOVOLOG 100 UNIT/ML injection as directed.      oxyCODONE -acetaminophen  (PERCOCET) 10-325 MG tablet Take 1 tablet by mouth every 6 (six) hours as needed for pain. 20 tablet 0   pregabalin (LYRICA) 75 MG capsule 1 tablet po bid for neuropathic pain Orally Twice a day; Duration: 90 days     Rimegepant Sulfate (NURTEC) 75 MG TBDP Take 75 mg by mouth daily as needed. For migraines. Take as close to onset of migraine as possible. One daily maximum. 32 tablet 0   Current Facility-Administered Medications  Medication Dose Route Frequency Provider Last Rate Last Admin   0.9 %  sodium chloride  infusion  500 mL Intravenous Once Shamika Pedregon V, MD        Allergies as of 07/09/2024 - Review Complete 07/09/2024  Allergen Reaction Noted   Codeine Itching 04/15/2016   Latex Rash 04/15/2016   Pregabalin Other (See Comments) 11/15/2022   Valacyclovir Hives 05/17/2023    Family History  Problem Relation Age of Onset   Diabetes Mother    Heart disease Mother    Colon polyps Father    Heart disease Father    Diabetes Father    Allergies Sister    Heart disease Brother    Allergies Brother    Colon cancer Neg Hx    Rectal cancer Neg Hx    Stomach cancer Neg Hx    Esophageal cancer Neg Hx    Pancreatic cancer Neg Hx     Social History   Socioeconomic History   Marital status: Married    Spouse name: Not on file   Number of children: Not on file   Years of education: Not on file   Highest education  level: Not on file  Occupational History   Not on file  Tobacco Use   Smoking status: Never   Smokeless tobacco: Never  Vaping Use   Vaping status: Never Used  Substance and Sexual Activity   Alcohol  use: Yes    Comment: glass of wine every now and then   Drug use: No   Sexual activity: Not on file  Other Topics Concern   Not on file  Social History Narrative   Lives at home with her husband   Right handed   Caffeine: 2 cups per day   Social Drivers of Health   Financial Resource Strain: Not on file  Food Insecurity: Not  on file  Transportation Needs: Not on file  Physical Activity: Not on file  Stress: Not on file  Social Connections: Unknown (02/11/2022)   [facility]   Social Network    Social Network: Not on file  Intimate Partner Violence: Unknown (01/04/2022)   Received from [facility]   HITS    Physically Hurt: Not on file    Insult or Talk Down To: Not on file    Threaten Physical Harm: Not on file    Scream or Curse: Not on file    Review of Systems:  All other review of systems negative except as mentioned in the HPI.  Physical Exam: Vital signs in last 24 hours: BP 111/72   Pulse 71   Temp (!) 97.3 F (36.3 C)   Ht 5' 2 (1.575 m)   Wt 107 lb (48.5 kg)   BMI 19.57 kg/m  General:   Alert, NAD Lungs:  Clear .   Heart:  Regular rate and rhythm Abdomen:  Soft, nontender and nondistended. Neuro/Psych:  Alert and cooperative. Normal mood and affect. A and O x 3  Reviewed labs, radiology imaging, old records and pertinent past GI work up  Patient is appropriate for planned procedure(s) and anesthesia in an ambulatory setting   K. Veena Colleen Donahoe , MD [phone]

## 2024-07-09 NOTE — Patient Instructions (Signed)
 Discharge instructions given. Normal exam. Office recall . Resume previous medications. YOU HAD AN ENDOSCOPIC PROCEDURE TODAY AT THE [facility]:   Refer to the procedure report that was given to you for any specific questions about what was found during the examination.  If the procedure report does not answer your questions, please call your gastroenterologist to clarify.  If you requested that your care partner not be given the details of your procedure findings, then the procedure report has been included in a sealed envelope for you to review at your convenience later.  YOU SHOULD EXPECT: Some feelings of bloating in the abdomen. Passage of more gas than usual.  Walking can help get rid of the air that was put into your GI tract during the procedure and reduce the bloating. If you had a lower endoscopy (such as a colonoscopy or flexible sigmoidoscopy) you may notice spotting of blood in your stool or on the toilet paper. If you underwent a bowel prep for your procedure, you may not have a normal bowel movement for a few days.  Please Note:  You might notice some irritation and congestion in your nose or some drainage.  This is from the oxygen used during your procedure.  There is no need for concern and it should clear up in a day or so.  SYMPTOMS TO REPORT IMMEDIATELY:  Following lower endoscopy (colonoscopy or flexible sigmoidoscopy):  Excessive amounts of blood in the stool  Significant tenderness or worsening of abdominal pains  Swelling of the abdomen that is new, acute  Fever of 100F or higher  For urgent or emergent issues, a gastroenterologist can be reached at any hour by calling [phone]. Do not use MyChart messaging for urgent concerns.    DIET:  We do recommend a small meal at first, but then you may proceed to your regular diet.  Drink plenty of fluids but you should avoid alcoholic beverages for 24 hours.  ACTIVITY:  You should plan to take it easy for  the rest of today and you should NOT DRIVE or use heavy machinery until tomorrow (because of the sedation medicines used during the test).    FOLLOW UP: Our staff will call the number listed on your records the next business day following your procedure.  We will call around 7:15- 8:00 am to check on you and address any questions or concerns that you may have regarding the information given to you following your procedure. If we do not reach you, we will leave a message.     If any biopsies were taken you will be contacted by phone or by letter within the next 1-3 weeks.  Please call us  at [phone] if you have not heard about the biopsies in 3 weeks.    SIGNATURES/CONFIDENTIALITY: You and/or your care partner have signed paperwork which will be entered into your electronic medical record.  These signatures attest to the fact that that the information above on your After Visit Summary has been reviewed and is understood.  Full responsibility of the confidentiality of this discharge information lies with you and/or your care-partner.

## 2024-07-09 NOTE — Progress Notes (Unsigned)
 Report to PACU, RN, vss, BBS= Clear.

## 2024-07-10 ENCOUNTER — Telehealth: Payer: Self-pay

## 2024-07-10 NOTE — Telephone Encounter (Signed)
  Follow up Call-     07/09/2024    8:53 AM  Call back number  Post procedure Call Back phone  # [phone]  Permission to leave phone message Yes     Patient questions:  Do you have a fever, pain , or abdominal swelling? No. Pain Score  0 *  Have you tolerated food without any problems? Yes.    Have you been able to return to your normal activities? Yes.    Do you have any questions about your discharge instructions: Diet   No. Medications  No. Follow up visit  No.  Do you have questions or concerns about your Care? No.  Actions: * If pain score is 4 or above: No action needed, pain <4.

## 2024-07-19 DIAGNOSIS — H65191 Other acute nonsuppurative otitis media, right ear: Secondary | ICD-10-CM | POA: Diagnosis not present

## 2024-07-19 DIAGNOSIS — H9201 Otalgia, right ear: Secondary | ICD-10-CM | POA: Diagnosis not present

## 2024-07-19 DIAGNOSIS — E1040 Type 1 diabetes mellitus with diabetic neuropathy, unspecified: Secondary | ICD-10-CM | POA: Diagnosis not present

## 2024-07-19 DIAGNOSIS — Z794 Long term (current) use of insulin: Secondary | ICD-10-CM | POA: Diagnosis not present

## 2024-07-19 DIAGNOSIS — I10 Essential (primary) hypertension: Secondary | ICD-10-CM | POA: Diagnosis not present

## 2024-08-06 NOTE — Progress Notes (Shared)
 [facility] - Clinic Note  08/20/2024     CHIEF COMPLAINT Patient presents for No chief complaint on file.    HISTORY OF PRESENT ILLNESS: Taylor Adams is a 73 y.o. female who presents to the clinic today for:    pt states she has to wear reading glasses, she states she cannot see anything up close, her vision feels like it has a film over it this morning, her last A1c was 6.3 on 09.29.24  Referring physician: Vernadine Charlie LELON, MD [address] [place],  [place] [address]  HISTORICAL INFORMATION:  Selected notes from the MEDICAL RECORD NUMBER Referred by self for DM exam LEE:  Ocular Hx- PMH-   CURRENT MEDICATIONS: No current outpatient medications on file. (Ophthalmic Drugs)   No current facility-administered medications for this visit. (Ophthalmic Drugs)   Current Outpatient Medications (Other)  Medication Sig   ALPRAZolam  (XANAX ) 0.5 MG tablet Take 1 tablet (0.5 mg total) by mouth 2 (two) times daily as needed for anxiety.   B-D ULTRA-FINE 33 LANCETS MISC    BAYER CONTOUR NEXT TEST test strip    Biotin 5000 MCG TABS Take 5,000 mcg by mouth daily.    bisacodyl 5 MG EC tablet Take 5 mg by mouth at bedtime.   budesonide -formoterol  (SYMBICORT ) 80-4.5 MCG/ACT inhaler Inhale 2 puffs into the lungs 2 (two) times daily.   Cholecalciferol (VITAMIN D3) 1000 units CAPS Take 1,000 Units by mouth 2 (two) times daily.    Cholecalciferol (VITAMIN D3) 50 MCG (2000 UT) capsule    Continuous Glucose Sensor (DEXCOM G7 SENSOR) MISC by Does not apply route.   cyclobenzaprine (FLEXERIL) 10 MG tablet Take 10 mg by mouth as needed.   denosumab  (PROLIA ) 60 MG/ML SOSY injection Inject 60 mg into the skin every 6 (six) months.   dicyclomine  (BENTYL ) 10 MG capsule Take 1 capsule (10 mg total) by mouth 4 (four) times daily -  before meals and at bedtime. As needed for abdominal spasms   GLUCAGEN HYPOKIT 1 MG SOLR injection  (Patient not taking: Reported on 07/09/2024)    hydrOXYzine (ATARAX/VISTARIL) 25 MG tablet Take 25 mg by mouth 3 (three) times daily as needed.   Insulin  Human (INSULIN  PUMP) 100 unit/ml SOLN Inject into the skin continuous. Novolog 100 unit/mL Basal Rate: 12 am to 4:30 am  0.375 4:30 am to 8 am   0.45 8 am to 12 pm    0.525 12 pm to 3 pm  0.45 3 pm to 9 pm 0.475 9 pm to 12 am 0.425   linaclotide  (LINZESS ) 145 MCG CAPS capsule TAKE ONE CAPSULE BY MOUTH DAILY BEFORE BREAKFAST   losartan (COZAAR) 50 MG tablet Take 50 mg by mouth daily.   metoCLOPramide  (REGLAN ) 5 MG tablet TAKE 1 TABLET BY MOUTH IN THE MORNING AND AT BEDTIME   Multiple Vitamin (MULTIVITAMIN) tablet Take 1 tablet by mouth daily. Women's 50+   NOVOLOG 100 UNIT/ML injection as directed.   oxyCODONE -acetaminophen  (PERCOCET) 10-325 MG tablet Take 1 tablet by mouth every 6 (six) hours as needed for pain.   pantoprazole  (PROTONIX ) 40 MG tablet Take 1 tablet (40 mg total) by mouth 2 (two) times daily.   pregabalin (LYRICA) 75 MG capsule 1 tablet po bid for neuropathic pain Orally Twice a day; Duration: 90 days   Probiotic Product (VSL#3) CAPS Take 1 capsule by mouth daily.   Rimegepant Sulfate (NURTEC) 75 MG TBDP Take 75 mg by mouth daily as needed. For migraines. Take as  close to onset of migraine as possible. One daily maximum.   traMADol  (ULTRAM ) 50 MG tablet Take 50 mg by mouth 2 (two) times daily as needed.   No current facility-administered medications for this visit. (Other)   REVIEW OF SYSTEMS:    ALLERGIES Allergies  Allergen Reactions   Codeine Itching    Pt denies this is an allergy    Latex Rash   Pregabalin Other (See Comments)    Despression, makes me feel hopeless.   Valacyclovir Hives    PAST MEDICAL HISTORY Past Medical History:  Diagnosis Date   Anxiety    Arthritis    Benign colon polyp    Cataracts, bilateral    Depression    Diabetes mellitus    Diarrhea    Gastroparesis    Headache    migraines   Intestinal bacterial overgrowth     Osteoporosis 2021   PONV (postoperative nausea and vomiting)    history of n/v   Salivary gland disorder    clogged salivary glands   SBO (small bowel obstruction) (HCC)    Past Surgical History:  Procedure Laterality Date   ABDOMINAL HYSTERECTOMY     APPENDECTOMY     BREAST EXCISIONAL BIOPSY Bilateral    benign   BREAST SURGERY     biopsy   CATARACT EXTRACTION Bilateral 2017   Dr. Lavonia   CATARACT EXTRACTION, BILATERAL     EYE SURGERY     ILEOCECETOMY  10/07/2013   Procedure: MAUDRY;  Surgeon: Lynda Leos, MD;  Location: [facility] OR;  Service: General;;   LAPAROTOMY N/A 10/07/2013   Procedure: EXPLORATORY LAPAROTOMY;  Surgeon: Lynda Leos, MD;  Location: [facility] OR;  Service: General;  Laterality: N/A;   LYSIS OF ADHESION  10/07/2013   Procedure: LYSIS OF ADHESION;  Surgeon: Lynda Leos, MD;  Location: [facility] OR;  Service: General;;   NASAL SEPTOPLASTY W/ TURBINOPLASTY Bilateral 12/28/2016   Procedure: NASAL SEPTOPLASTY WITH  BILATARAL TURBINATE REDUCTION;  Surgeon: Daniel Moccasin, MD;  Location: [facility] OR;  Service: ENT;  Laterality: Bilateral;   resection of lymph node in neck     ROTATOR CUFF REPAIR     right side   SALIVARY GLAND SURGERY  11/25/2020   SINUS ENDO WITH FUSION Bilateral 12/28/2016   Procedure: ENDOSCOPIC TOTAL ETHMOIDECTOMY,ENDOSCOPIC MAXILLARY ANTROSTOMY ;  Surgeon: Daniel Moccasin, MD;  Location: [facility] OR;  Service: ENT;  Laterality: Bilateral;   YAG LASER APPLICATION Bilateral 2018   FAMILY HISTORY Family History  Problem Relation Age of Onset   Diabetes Mother    Heart disease Mother    Colon polyps Father    Heart disease Father    Diabetes Father    Allergies Sister    Heart disease Brother    Allergies Brother    Colon cancer Neg Hx    Rectal cancer Neg Hx    Stomach cancer Neg Hx    Esophageal cancer Neg Hx    Pancreatic cancer Neg Hx    SOCIAL HISTORY Social History   Tobacco Use   Smoking status: Never   Smokeless tobacco: Never  Vaping Use   Vaping  status: Never Used  Substance Use Topics   Alcohol  use: Yes    Comment: glass of wine every now and then   Drug use: No       OPHTHALMIC EXAM: Not recorded    IMAGING AND PROCEDURES  Imaging and Procedures for @TODAY @          ASSESSMENT/PLAN:  No diagnosis found.  1,2. Diabetes mellitus, type 1 with mild nonproliferative diabetic retinopathy OU -- stable  - last A1c was 6.3 on 09.29.24 -- per pt report  - exam shows just mild peripheral MA OU, no DME or MA in maculae OU  - The incidence, risk factors for progression, natural history and treatment options for diabetic retinopathy  were discussed with patient.    - The need for close monitoring of blood glucose, blood pressure, and serum lipids, avoiding cigarette or any type of tobacco, and the need for long term follow up was also discussed with patient.  - f/u 1 year, sooner prn  3,4. Hypertensive retinopathy OU  - discussed importance of tight BP control  - continue to monitor  5. Pseudophakia OU  - s/p CE/IOL OU (Bevis)  - s/p YAG cap OS (Bevis)  - beautiful surgeries, doing well  - continue to monitor  6. History of Myopia  - high myopia prior to cataract surgery  - myopic contour noted on OCT  7. Migraine with visual disturbances OS  - pt with history of intractable migraine w/ complaint of fog/film in left visual field  - following closely with Neurology  - nothing on dilated eye exam to correlate visual disturbance and on our objective testing is seeing 20/20 and no significant ocular findings  - recommend continued management with Neurology  Ophthalmic Meds Ordered this visit:  No orders of the defined types were placed in this encounter.     No follow-ups on file.  There are no Patient Instructions on file for this visit.  This document serves as a record of services personally performed by Redell JUDITHANN Hans, MD, PhD. It was created on their behalf by Paz GEANNIE Keens, COT an ophthalmic  technician. The creation of this record is the provider's dictation and/or activities during the visit.    Electronically signed by:  Karuna GEANNIE Keens, COT  08/06/24 12:34 PM    Redell JUDITHANN Hans, M.D., Ph.D. Diseases & Surgery of the Retina and Vitreous [facility] 08/15/2023      Abbreviations: M myopia (nearsighted); A astigmatism; H hyperopia (farsighted); P presbyopia; Mrx spectacle prescription;  CTL contact lenses; OD right eye; OS left eye; OU both eyes  XT exotropia; ET esotropia; PEK punctate epithelial keratitis; PEE punctate epithelial erosions; DES dry eye syndrome; MGD meibomian gland dysfunction; ATs artificial tears; PFAT's preservative free artificial tears; NSC nuclear sclerotic cataract; PSC posterior subcapsular cataract; ERM epi-retinal membrane; PVD posterior vitreous detachment; RD retinal detachment; DM diabetes mellitus; DR diabetic retinopathy; NPDR non-proliferative diabetic retinopathy; PDR proliferative diabetic retinopathy; CSME clinically significant macular edema; DME diabetic macular edema; dbh dot blot hemorrhages; CWS cotton wool spot; POAG primary open angle glaucoma; C/D cup-to-disc ratio; HVF humphrey visual field; GVF goldmann visual field; OCT optical coherence tomography; IOP intraocular pressure; BRVO Branch retinal vein occlusion; CRVO central retinal vein occlusion; CRAO central retinal artery occlusion; BRAO branch retinal artery occlusion; RT retinal tear; SB scleral buckle; PPV pars plana vitrectomy; VH Vitreous hemorrhage; PRP panretinal laser photocoagulation; IVK intravitreal kenalog; VMT vitreomacular traction; MH Macular hole;  NVD neovascularization of the disc; NVE neovascularization elsewhere; AREDS age related eye disease study; ARMD age related macular degeneration; POAG primary open angle glaucoma; EBMD epithelial/anterior basement membrane dystrophy; ACIOL anterior chamber intraocular lens; IOL intraocular lens; PCIOL  posterior chamber intraocular lens; Phaco/IOL phacoemulsification with intraocular lens placement; PRK photorefractive keratectomy; LASIK laser assisted in situ keratomileusis; HTN hypertension; DM diabetes mellitus; COPD chronic obstructive pulmonary  disease

## 2024-08-13 ENCOUNTER — Other Ambulatory Visit: Payer: Self-pay | Admitting: Gastroenterology

## 2024-08-20 ENCOUNTER — Encounter (INDEPENDENT_AMBULATORY_CARE_PROVIDER_SITE_OTHER): Payer: Self-pay

## 2024-08-20 ENCOUNTER — Encounter (INDEPENDENT_AMBULATORY_CARE_PROVIDER_SITE_OTHER): Payer: Medicare Other | Admitting: Ophthalmology

## 2024-08-20 DIAGNOSIS — I10 Essential (primary) hypertension: Secondary | ICD-10-CM

## 2024-08-20 DIAGNOSIS — Z8669 Personal history of other diseases of the nervous system and sense organs: Secondary | ICD-10-CM

## 2024-08-20 DIAGNOSIS — H539 Unspecified visual disturbance: Secondary | ICD-10-CM

## 2024-08-20 DIAGNOSIS — Z961 Presence of intraocular lens: Secondary | ICD-10-CM

## 2024-08-20 DIAGNOSIS — G43711 Chronic migraine without aura, intractable, with status migrainosus: Secondary | ICD-10-CM

## 2024-08-20 DIAGNOSIS — E103293 Type 1 diabetes mellitus with mild nonproliferative diabetic retinopathy without macular edema, bilateral: Secondary | ICD-10-CM

## 2024-08-20 DIAGNOSIS — H35033 Hypertensive retinopathy, bilateral: Secondary | ICD-10-CM

## 2024-09-10 ENCOUNTER — Encounter: Payer: Self-pay | Admitting: Gastroenterology

## 2024-09-11 ENCOUNTER — Other Ambulatory Visit: Payer: Self-pay | Admitting: Internal Medicine

## 2024-09-11 DIAGNOSIS — Z1231 Encounter for screening mammogram for malignant neoplasm of breast: Secondary | ICD-10-CM

## 2024-09-11 MED ORDER — METOCLOPRAMIDE HCL 5 MG PO TABS: 5.0000 mg | ORAL_TABLET | Freq: Two times a day (BID) | ORAL | 1 refills | Status: AC

## 2024-09-12 ENCOUNTER — Ambulatory Visit
Admission: RE | Admit: 2024-09-12 | Discharge: 2024-09-12 | Disposition: A | Discharge status: Home / Self Care | Source: Ambulatory Visit | Attending: Internal Medicine | Admitting: Internal Medicine

## 2024-09-12 DIAGNOSIS — Z1231 Encounter for screening mammogram for malignant neoplasm of breast: Secondary | ICD-10-CM | POA: Diagnosis not present

## 2024-09-16 DIAGNOSIS — E7849 Other hyperlipidemia: Secondary | ICD-10-CM | POA: Diagnosis not present

## 2024-09-16 DIAGNOSIS — Z1212 Encounter for screening for malignant neoplasm of rectum: Secondary | ICD-10-CM | POA: Diagnosis not present

## 2024-09-18 ENCOUNTER — Other Ambulatory Visit: Payer: Self-pay | Admitting: Internal Medicine

## 2024-09-18 DIAGNOSIS — R928 Other abnormal and inconclusive findings on diagnostic imaging of breast: Secondary | ICD-10-CM

## 2024-10-01 ENCOUNTER — Ambulatory Visit: Admitting: Family Medicine
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
## 2024-10-04 ENCOUNTER — Encounter

## 2024-10-04 ENCOUNTER — Other Ambulatory Visit

## 2024-10-07 ENCOUNTER — Ambulatory Visit (INDEPENDENT_AMBULATORY_CARE_PROVIDER_SITE_OTHER): Admitting: Family Medicine

## 2024-10-07 ENCOUNTER — Encounter: Payer: Self-pay | Admitting: Neurology

## 2024-10-07 ENCOUNTER — Encounter: Payer: Self-pay | Admitting: Family Medicine

## 2024-10-07 VITALS — BP 128/86

## 2024-10-07 DIAGNOSIS — G43709 Chronic migraine without aura, not intractable, without status migrainosus: Secondary | ICD-10-CM

## 2024-10-07 MED ORDER — ONABOTULINUMTOXINA 200 UNITS IJ SOLR
155.0000 [IU] | Freq: Once | INTRAMUSCULAR | Status: AC
  Administered 2024-10-07: 155 [IU] via INTRAMUSCULAR

## 2024-10-07 NOTE — Progress Notes (Signed)
 Botox - 200 units x 1 vial Lot: [id number] Expiration: 2028/03 NDC: 0023-3921-02  Bacteriostatic 0.9% Sodium Chloride - 4 mL  Lot: [id number] Expiration: 12/31/2025 NDC: 9590803397  Dx: H56.290  B/B Witnessed by JINNY ROYS RMA

## 2024-10-15 NOTE — Progress Notes (Unsigned)
 "  Chief Complaint: Follow-up after EGD.  HPI:    Taylor Adams is a 74 year old female, known to Dr. Shila, with past medical history as listed below, who presents to clinic today for follow-up after recent EGD.    05/23/2024 office visit with Dr. Nandigam for gastroparesis.  At that time discussed patient had glycemic control and diabetic neuropathy.  Gastroparesis was managed with Dramamine.  Linzess  every morning.  Continued on Dramamine as needed for nausea.    07/09/2024 EGD for epigastric pain and reflux was entirely normal.  GI Hx: EGD July 2022: Mild duodenitis otherwise unremarkable   Small bowel follow-through was negative for bowel obstruction.   She was treated with Xifaxan  in 2017 for small intestinal bacterial overgrowth and she has been relatively symptom-free for past few years until recent flare up.       High-grade SBO in January 2015 , she underwent exploratory laparotomy with lysis of adhesions and ileocectomy.   In 2002 she was diagnosed with bacterial overgrowth on a breath Hydrogen test.   She has history of gastroparesis. She was on Reglan  but it was discontinued in 2015 given no significant improvement  Patient had a colonoscopy in 2002 with removal of a tubular adenoma. A repeat colonoscopy in 2007 was normal. Recent colonoscopy in July 2017 was unremarkable as well except for sigmoid diverticulosis .    Other past GI workup include  small bowel follow-through in 2005 was normal. An upper abdominal ultrasound in 2005 was negative. Her celiac profile was normal in 2005      Past Medical History:  Diagnosis Date   Anxiety    Arthritis    Benign colon polyp    Cataracts, bilateral    Depression    Diabetes mellitus    Diarrhea    Gastroparesis    Headache    migraines   Intestinal bacterial overgrowth    Osteoporosis 2021   PONV (postoperative nausea and vomiting)    history of n/v   Salivary gland disorder    clogged salivary glands   SBO (small bowel  obstruction) (HCC)     Past Surgical History:  Procedure Laterality Date   ABDOMINAL HYSTERECTOMY     APPENDECTOMY     BREAST EXCISIONAL BIOPSY Bilateral    benign   BREAST SURGERY     biopsy   CATARACT EXTRACTION Bilateral 2017   Dr. Lavonia   CATARACT EXTRACTION, BILATERAL     EYE SURGERY     ILEOCECETOMY  10/07/2013   Procedure: MAUDRY;  Surgeon: Lynda Leos, MD;  Location: [facility] OR;  Service: General;;   LAPAROTOMY N/A 10/07/2013   Procedure: EXPLORATORY LAPAROTOMY;  Surgeon: Lynda Leos, MD;  Location: [facility] OR;  Service: General;  Laterality: N/A;   LYSIS OF ADHESION  10/07/2013   Procedure: LYSIS OF ADHESION;  Surgeon: Lynda Leos, MD;  Location: [facility] OR;  Service: General;;   NASAL SEPTOPLASTY W/ TURBINOPLASTY Bilateral 12/28/2016   Procedure: NASAL SEPTOPLASTY WITH  BILATARAL TURBINATE REDUCTION;  Surgeon: Daniel Moccasin, MD;  Location: [facility] OR;  Service: ENT;  Laterality: Bilateral;   resection of lymph node in neck     ROTATOR CUFF REPAIR     right side   SALIVARY GLAND SURGERY  11/25/2020   SINUS ENDO WITH FUSION Bilateral 12/28/2016   Procedure: ENDOSCOPIC TOTAL ETHMOIDECTOMY,ENDOSCOPIC MAXILLARY ANTROSTOMY ;  Surgeon: Daniel Moccasin, MD;  Location: [facility] OR;  Service: ENT;  Laterality: Bilateral;   YAG LASER APPLICATION Bilateral 2018    Current Outpatient  Medications  Medication Sig Dispense Refill   ALPRAZolam  (XANAX ) 0.5 MG tablet Take 1 tablet (0.5 mg total) by mouth 2 (two) times daily as needed for anxiety. 30 tablet 0   B-D ULTRA-FINE 33 LANCETS MISC      BAYER CONTOUR NEXT TEST test strip      Biotin 5000 MCG TABS Take 5,000 mcg by mouth daily.      bisacodyl 5 MG EC tablet Take 5 mg by mouth at bedtime.     budesonide -formoterol  (SYMBICORT ) 80-4.5 MCG/ACT inhaler Inhale 2 puffs into the lungs 2 (two) times daily. 1 Inhaler 11   Cholecalciferol (VITAMIN D3) 1000 units CAPS Take 1,000 Units by mouth 2 (two) times daily.      Cholecalciferol (VITAMIN D3) 50 MCG (2000 UT)  capsule      Continuous Glucose Sensor (DEXCOM G7 SENSOR) MISC by Does not apply route.     cyclobenzaprine (FLEXERIL) 10 MG tablet Take 10 mg by mouth as needed.     denosumab  (PROLIA ) 60 MG/ML SOSY injection Inject 60 mg into the skin every 6 (six) months.     dicyclomine  (BENTYL ) 10 MG capsule Take 1 capsule (10 mg total) by mouth 4 (four) times daily -  before meals and at bedtime. As needed for abdominal spasms 120 capsule 11   GLUCAGEN HYPOKIT 1 MG SOLR injection  (Patient not taking: Reported on 07/09/2024)  6   hydrOXYzine (ATARAX/VISTARIL) 25 MG tablet Take 25 mg by mouth 3 (three) times daily as needed.     Insulin  Human (INSULIN  PUMP) 100 unit/ml SOLN Inject into the skin continuous. Novolog 100 unit/mL Basal Rate: 12 am to 4:30 am  0.375 4:30 am to 8 am   0.45 8 am to 12 pm    0.525 12 pm to 3 pm  0.45 3 pm to 9 pm 0.475 9 pm to 12 am 0.425     linaclotide  (LINZESS ) 145 MCG CAPS capsule TAKE ONE CAPSULE BY MOUTH DAILY BEFORE BREAKFAST 90 capsule 3   losartan (COZAAR) 50 MG tablet Take 50 mg by mouth daily.     metoCLOPramide  (REGLAN ) 5 MG tablet Take 1 tablet (5 mg total) by mouth in the morning and at bedtime. 60 tablet 1   Multiple Vitamin (MULTIVITAMIN) tablet Take 1 tablet by mouth daily. Women's 50+     NOVOLOG 100 UNIT/ML injection as directed.     oxyCODONE -acetaminophen  (PERCOCET) 10-325 MG tablet Take 1 tablet by mouth every 6 (six) hours as needed for pain. 20 tablet 0   pantoprazole  (PROTONIX ) 40 MG tablet TAKE 1 TABLET BY MOUTH TWICE  DAILY 180 tablet 2   pregabalin (LYRICA) 75 MG capsule 1 tablet po bid for neuropathic pain Orally Twice a day; Duration: 90 days     Probiotic Product (VSL#3) CAPS Take 1 capsule by mouth daily. 30 capsule 11   Rimegepant Sulfate (NURTEC) 75 MG TBDP Take 75 mg by mouth daily as needed. For migraines. Take as close to onset of migraine as possible. One daily maximum. 32 tablet 0   traMADol  (ULTRAM ) 50 MG tablet Take 50 mg by mouth 2 (two)  times daily as needed.     No current facility-administered medications for this visit.    Allergies as of 10/17/2024 - Review Complete 10/07/2024  Allergen Reaction Noted   Codeine Itching 04/15/2016   Latex Rash 04/15/2016   Pregabalin Other (See Comments) 11/15/2022   Valacyclovir Hives 05/17/2023    Family History  Problem Relation Age of Onset  Diabetes Mother    Heart disease Mother    Colon polyps Father    Heart disease Father    Diabetes Father    Allergies Sister    Heart disease Brother    Allergies Brother    Colon cancer Neg Hx    Rectal cancer Neg Hx    Stomach cancer Neg Hx    Esophageal cancer Neg Hx    Pancreatic cancer Neg Hx     Social History   Socioeconomic History   Marital status: Married    Spouse name: Not on file   Number of children: Not on file   Years of education: Not on file   Highest education level: Not on file  Occupational History   Not on file  Tobacco Use   Smoking status: Never   Smokeless tobacco: Never  Vaping Use   Vaping status: Never Used  Substance and Sexual Activity   Alcohol  use: Yes    Comment: glass of wine every now and then   Drug use: No   Sexual activity: Not on file  Other Topics Concern   Not on file  Social History Narrative   Lives at home with her husband   Right handed   Caffeine: 2 cups per day   Social Drivers of Health   Tobacco Use: Low Risk (10/07/2024)   Patient History    Smoking Tobacco Use: Never    Smokeless Tobacco Use: Never    Passive Exposure: Not on file  Financial Resource Strain: Not on file  Food Insecurity: Not on file  Transportation Needs: Not on file  Physical Activity: Not on file  Stress: Not on file  Social Connections: Unknown (02/11/2022)   Received from [facility]   Social Network    Social Network: Not on file  Intimate Partner Violence: Unknown (01/04/2022)   Received from [facility]   HITS    Physically Hurt: Not on file    Insult or Talk Down To:  Not on file    Threaten Physical Harm: Not on file    Scream or Curse: Not on file  Depression (PHQ2-9): Not on file  Alcohol  Screen: Not on file  Housing: Not on file  Utilities: Not on file  Health Literacy: Not on file    Review of Systems:    Constitutional: No weight loss, fever, chills, weakness or fatigue HEENT: Eyes: No change in vision               Ears, Nose, Throat:  No change in hearing or congestion Skin: No rash or itching Cardiovascular: No chest pain, chest pressure or palpitations   Respiratory: No SOB or cough Gastrointestinal: See HPI and otherwise negative Genitourinary: No dysuria or change in urinary frequency Neurological: No headache, dizziness or syncope Musculoskeletal: No new muscle or joint pain Hematologic: No bleeding or bruising Psychiatric: No history of depression or anxiety    Physical Exam:  Vital signs: There were no vitals taken for this visit.  Constitutional:   Pleasant Caucasian female appears to be in NAD, Well developed, Well nourished, alert and cooperative Head:  Normocephalic and atraumatic. Eyes:   PEERL, EOMI. No icterus. Conjunctiva pink. Ears:  Normal auditory acuity. Neck:  Supple Throat: Oral cavity and pharynx without inflammation, swelling or lesion.  Respiratory: Respirations even and unlabored. Lungs clear to auscultation bilaterally.   No wheezes, crackles, or rhonchi.  Cardiovascular: Normal S1, S2. No MRG. Regular rate and rhythm. No peripheral edema, cyanosis or pallor.  Gastrointestinal:  Soft, nondistended, nontender. No rebound or guarding. Normal bowel sounds. No appreciable masses or hepatomegaly. Rectal:  Not performed.  Msk:  Symmetrical without gross deformities. Without edema, no deformity or joint abnormality.  Neurologic:  Alert and  oriented x4;  grossly normal neurologically.  Skin:   Dry and intact without significant lesions or rashes. Psychiatric: Oriented to person, place and time. Demonstrates good  judgement and reason without abnormal affect or behaviors.  RELEVANT LABS AND IMAGING: CBC    Component Value Date/Time   WBC 5.1 02/01/2018 1640   RBC 4.26 02/01/2018 1640   HGB 12.1 02/01/2018 1640   HCT 36.2 02/01/2018 1640   PLT 247.0 02/01/2018 1640   MCV 84.9 02/01/2018 1640   MCH 28.7 12/21/2016 1047   MCHC 33.4 02/01/2018 1640   RDW 14.3 02/01/2018 1640   LYMPHSABS 2.0 02/01/2018 1640   MONOABS 0.4 02/01/2018 1640   EOSABS 0.4 02/01/2018 1640   BASOSABS 0.0 02/01/2018 1640    CMP     Component Value Date/Time   NA 136 12/21/2016 1047   K 5.1 12/21/2016 1047   CL 100 (L) 12/21/2016 1047   CO2 26 12/21/2016 1047   GLUCOSE 139 (H) 12/21/2016 1047   BUN 14 07/08/2021 1032   CREATININE 0.79 07/08/2021 1032   CALCIUM 10.2 12/21/2016 1047   PROT 4.9 (L) 10/10/2013 0515   ALBUMIN  1.9 (L) 10/10/2013 0515   AST 29 10/10/2013 0515   ALT 13 10/10/2013 0515   ALKPHOS 76 10/10/2013 0515   BILITOT <0.2 (L) 10/10/2013 0515   GFRNONAA 61.7 03/12/2024 1007   GFRAA >60 12/21/2016 1047    Assessment: 1. ***  Plan: 1. ***     Delon Failing, PA-C [facility] 10/15/2024, 1:24 PM  Cc: Tisovec, Richard W, MD  "

## 2024-10-17 ENCOUNTER — Ambulatory Visit: Admitting: Physician Assistant

## 2024-10-17 ENCOUNTER — Encounter: Payer: Self-pay | Admitting: Physician Assistant

## 2024-10-17 VITALS — BP 110/70 | HR 80 | Ht 62.0 in | Wt 108.4 lb

## 2024-10-17 DIAGNOSIS — K3184 Gastroparesis: Secondary | ICD-10-CM | POA: Diagnosis not present

## 2024-10-17 DIAGNOSIS — K5904 Chronic idiopathic constipation: Secondary | ICD-10-CM

## 2024-10-17 DIAGNOSIS — K5909 Other constipation: Secondary | ICD-10-CM | POA: Diagnosis not present

## 2024-10-17 NOTE — Patient Instructions (Signed)
 Continue current medications.   Can increase Linzess  to 145 mcg twice daily for a couple days as needed.

## 2024-10-19 ENCOUNTER — Ambulatory Visit

## 2024-10-19 ENCOUNTER — Ambulatory Visit
Admission: RE | Admit: 2024-10-19 | Discharge: 2024-10-19 | Disposition: A | Discharge status: Home / Self Care | Source: Ambulatory Visit | Attending: Internal Medicine | Admitting: Internal Medicine

## 2024-10-19 DIAGNOSIS — R928 Other abnormal and inconclusive findings on diagnostic imaging of breast: Secondary | ICD-10-CM

## 2024-10-30 ENCOUNTER — Other Ambulatory Visit

## 2024-10-30 ENCOUNTER — Encounter

## 2024-12-30 ENCOUNTER — Ambulatory Visit: Admitting: Family Medicine

## 2025-02-10 ENCOUNTER — Ambulatory Visit: Admitting: Family Medicine

## 2025-10-20 ENCOUNTER — Ambulatory Visit: Admitting: Family Medicine
